# Patient Record
Sex: Female | Born: 1950
Health system: Southern US, Community
[De-identification: ages and names within clinical notes are randomized; demographics above are authoritative.]

## PROBLEM LIST (undated history)

## (undated) DIAGNOSIS — R569 Unspecified convulsions: Secondary | ICD-10-CM

## (undated) DIAGNOSIS — M810 Age-related osteoporosis without current pathological fracture: Secondary | ICD-10-CM

## (undated) DIAGNOSIS — R011 Cardiac murmur, unspecified: Secondary | ICD-10-CM

## (undated) DIAGNOSIS — I119 Hypertensive heart disease without heart failure: Secondary | ICD-10-CM

## (undated) DIAGNOSIS — E039 Hypothyroidism, unspecified: Secondary | ICD-10-CM

## (undated) DIAGNOSIS — I10 Essential (primary) hypertension: Secondary | ICD-10-CM

## (undated) DIAGNOSIS — K219 Gastro-esophageal reflux disease without esophagitis: Secondary | ICD-10-CM

## (undated) DIAGNOSIS — M1611 Unilateral primary osteoarthritis, right hip: Secondary | ICD-10-CM

## (undated) HISTORY — PX: BACK SURGERY: SHX140

## (undated) HISTORY — PX: APPENDECTOMY: SHX54

## (undated) HISTORY — DX: Essential (primary) hypertension: I10

## (undated) HISTORY — DX: Age-related osteoporosis without current pathological fracture: M81.0

## (undated) HISTORY — PX: WRIST FRACTURE SURGERY: SHX121

## (undated) HISTORY — PX: JOINT REPLACEMENT: SHX530

## (undated) HISTORY — PX: FRACTURE SURGERY: SHX138

## (undated) HISTORY — PX: COLONOSCOPY: SHX174

---

## 1991-09-05 HISTORY — PX: VAGINAL HYSTERECTOMY: SUR661

## 1993-09-04 HISTORY — PX: LAPAROSCOPIC CHOLECYSTECTOMY: SUR755

## 1999-08-04 ENCOUNTER — Encounter: Admission: RE | Admit: 1999-08-04 | Discharge: 1999-08-04 | Payer: Self-pay | Admitting: *Deleted

## 1999-08-04 ENCOUNTER — Encounter: Payer: Self-pay | Admitting: *Deleted

## 1999-08-08 ENCOUNTER — Other Ambulatory Visit: Admission: RE | Admit: 1999-08-08 | Discharge: 1999-08-08 | Payer: Self-pay | Admitting: *Deleted

## 2000-01-16 ENCOUNTER — Encounter: Admission: RE | Admit: 2000-01-16 | Discharge: 2000-01-16 | Payer: Self-pay

## 2000-08-07 ENCOUNTER — Encounter: Payer: Self-pay | Admitting: *Deleted

## 2000-08-07 ENCOUNTER — Encounter: Admission: RE | Admit: 2000-08-07 | Discharge: 2000-08-07 | Payer: Self-pay | Admitting: *Deleted

## 2000-10-01 ENCOUNTER — Other Ambulatory Visit: Admission: RE | Admit: 2000-10-01 | Discharge: 2000-10-01 | Payer: Self-pay | Admitting: *Deleted

## 2001-08-12 ENCOUNTER — Encounter: Payer: Self-pay | Admitting: Cardiology

## 2001-08-12 ENCOUNTER — Encounter: Admission: RE | Admit: 2001-08-12 | Discharge: 2001-08-12 | Payer: Self-pay | Admitting: Cardiology

## 2001-11-28 ENCOUNTER — Encounter: Admission: RE | Admit: 2001-11-28 | Discharge: 2001-11-28 | Payer: Self-pay | Admitting: Cardiology

## 2001-11-28 ENCOUNTER — Encounter: Payer: Self-pay | Admitting: Cardiology

## 2002-09-08 ENCOUNTER — Encounter: Payer: Self-pay | Admitting: Cardiology

## 2002-09-08 ENCOUNTER — Ambulatory Visit (HOSPITAL_COMMUNITY): Admission: RE | Admit: 2002-09-08 | Discharge: 2002-09-08 | Payer: Self-pay | Admitting: Cardiology

## 2003-01-01 ENCOUNTER — Encounter: Payer: Self-pay | Admitting: Cardiology

## 2003-01-01 ENCOUNTER — Ambulatory Visit (HOSPITAL_COMMUNITY): Admission: RE | Admit: 2003-01-01 | Discharge: 2003-01-01 | Payer: Self-pay | Admitting: Cardiology

## 2003-09-30 ENCOUNTER — Other Ambulatory Visit: Admission: RE | Admit: 2003-09-30 | Discharge: 2003-09-30 | Payer: Self-pay | Admitting: Obstetrics and Gynecology

## 2004-02-17 ENCOUNTER — Encounter: Admission: RE | Admit: 2004-02-17 | Discharge: 2004-02-17 | Payer: Self-pay | Admitting: Neurosurgery

## 2004-02-23 ENCOUNTER — Ambulatory Visit (HOSPITAL_COMMUNITY): Admission: RE | Admit: 2004-02-23 | Discharge: 2004-02-24 | Payer: Self-pay | Admitting: Neurosurgery

## 2004-02-23 HISTORY — PX: OTHER SURGICAL HISTORY: SHX169

## 2004-05-02 ENCOUNTER — Ambulatory Visit (HOSPITAL_COMMUNITY): Admission: RE | Admit: 2004-05-02 | Discharge: 2004-05-02 | Payer: Self-pay | Admitting: Neurosurgery

## 2004-05-03 ENCOUNTER — Ambulatory Visit (HOSPITAL_COMMUNITY): Admission: RE | Admit: 2004-05-03 | Discharge: 2004-05-04 | Payer: Self-pay | Admitting: Neurosurgery

## 2004-05-03 HISTORY — PX: OTHER SURGICAL HISTORY: SHX169

## 2004-10-10 ENCOUNTER — Encounter: Admission: RE | Admit: 2004-10-10 | Discharge: 2004-10-10 | Payer: Self-pay | Admitting: Cardiology

## 2005-01-03 ENCOUNTER — Emergency Department (HOSPITAL_COMMUNITY): Admission: EM | Admit: 2005-01-03 | Discharge: 2005-01-03 | Payer: Self-pay | Admitting: Emergency Medicine

## 2005-05-16 ENCOUNTER — Inpatient Hospital Stay (HOSPITAL_COMMUNITY): Admission: RE | Admit: 2005-05-16 | Discharge: 2005-05-18 | Payer: Self-pay | Admitting: Neurosurgery

## 2005-05-16 HISTORY — PX: POSTERIOR LUMBAR FUSION: SHX6036

## 2005-11-27 ENCOUNTER — Other Ambulatory Visit: Admission: RE | Admit: 2005-11-27 | Discharge: 2005-11-27 | Payer: Self-pay | Admitting: Obstetrics and Gynecology

## 2006-02-02 ENCOUNTER — Ambulatory Visit: Payer: Self-pay | Admitting: Internal Medicine

## 2006-02-22 ENCOUNTER — Ambulatory Visit: Payer: Self-pay | Admitting: Internal Medicine

## 2006-12-03 ENCOUNTER — Ambulatory Visit (HOSPITAL_COMMUNITY): Admission: RE | Admit: 2006-12-03 | Discharge: 2006-12-03 | Payer: Self-pay | Admitting: Obstetrics and Gynecology

## 2007-12-04 ENCOUNTER — Ambulatory Visit (HOSPITAL_COMMUNITY): Admission: RE | Admit: 2007-12-04 | Discharge: 2007-12-04 | Payer: Self-pay | Admitting: Obstetrics and Gynecology

## 2008-12-10 ENCOUNTER — Ambulatory Visit (HOSPITAL_COMMUNITY): Admission: RE | Admit: 2008-12-10 | Discharge: 2008-12-10 | Payer: Self-pay | Admitting: Obstetrics and Gynecology

## 2009-01-14 ENCOUNTER — Emergency Department (HOSPITAL_COMMUNITY): Admission: EM | Admit: 2009-01-14 | Discharge: 2009-01-14 | Payer: Self-pay | Admitting: Emergency Medicine

## 2009-01-14 ENCOUNTER — Ambulatory Visit (HOSPITAL_BASED_OUTPATIENT_CLINIC_OR_DEPARTMENT_OTHER): Admission: RE | Admit: 2009-01-14 | Discharge: 2009-01-14 | Payer: Self-pay | Admitting: Orthopedic Surgery

## 2009-01-14 HISTORY — PX: OTHER SURGICAL HISTORY: SHX169

## 2009-12-16 ENCOUNTER — Ambulatory Visit (HOSPITAL_COMMUNITY): Admission: RE | Admit: 2009-12-16 | Discharge: 2009-12-16 | Payer: Self-pay | Admitting: Obstetrics and Gynecology

## 2010-02-18 ENCOUNTER — Encounter: Admission: RE | Admit: 2010-02-18 | Discharge: 2010-02-18 | Payer: Self-pay | Admitting: Cardiology

## 2010-05-05 ENCOUNTER — Ambulatory Visit: Payer: Self-pay | Admitting: Cardiology

## 2010-08-18 ENCOUNTER — Ambulatory Visit: Payer: Self-pay | Admitting: Cardiology

## 2010-10-03 ENCOUNTER — Ambulatory Visit: Payer: Self-pay | Admitting: Cardiology

## 2010-10-11 ENCOUNTER — Emergency Department (HOSPITAL_COMMUNITY)
Admission: EM | Admit: 2010-10-11 | Discharge: 2010-10-11 | Disposition: A | Discharge status: Home / Self Care | Payer: 59 | Attending: Emergency Medicine | Admitting: Emergency Medicine

## 2010-10-11 DIAGNOSIS — R040 Epistaxis: Secondary | ICD-10-CM | POA: Insufficient documentation

## 2010-10-11 DIAGNOSIS — Z79899 Other long term (current) drug therapy: Secondary | ICD-10-CM | POA: Insufficient documentation

## 2010-10-18 ENCOUNTER — Ambulatory Visit (INDEPENDENT_AMBULATORY_CARE_PROVIDER_SITE_OTHER): Payer: Commercial Managed Care - PPO | Admitting: Cardiology

## 2010-10-18 DIAGNOSIS — E7800 Pure hypercholesterolemia, unspecified: Secondary | ICD-10-CM

## 2010-10-18 DIAGNOSIS — Z79899 Other long term (current) drug therapy: Secondary | ICD-10-CM

## 2010-10-18 DIAGNOSIS — I119 Hypertensive heart disease without heart failure: Secondary | ICD-10-CM

## 2010-11-01 ENCOUNTER — Ambulatory Visit (INDEPENDENT_AMBULATORY_CARE_PROVIDER_SITE_OTHER): Payer: Commercial Managed Care - PPO | Admitting: Cardiology

## 2010-11-01 DIAGNOSIS — I119 Hypertensive heart disease without heart failure: Secondary | ICD-10-CM

## 2010-11-22 ENCOUNTER — Other Ambulatory Visit: Payer: Self-pay | Admitting: *Deleted

## 2010-11-22 DIAGNOSIS — G4700 Insomnia, unspecified: Secondary | ICD-10-CM

## 2010-11-22 MED ORDER — CLONAZEPAM 1 MG PO TABS: 1.0000 mg | ORAL_TABLET | ORAL | Status: DC

## 2010-11-22 NOTE — Telephone Encounter (Signed)
Refilled meds per fax request. CALLED TO PHARMACY.

## 2010-11-28 ENCOUNTER — Inpatient Hospital Stay (INDEPENDENT_AMBULATORY_CARE_PROVIDER_SITE_OTHER)
Admission: RE | Admit: 2010-11-28 | Discharge: 2010-11-28 | Disposition: A | Discharge status: Home / Self Care | Payer: Commercial Managed Care - PPO | Source: Ambulatory Visit | Attending: Family Medicine | Admitting: Family Medicine

## 2010-11-28 DIAGNOSIS — J069 Acute upper respiratory infection, unspecified: Secondary | ICD-10-CM

## 2010-11-29 ENCOUNTER — Other Ambulatory Visit: Payer: Self-pay | Admitting: Cardiology

## 2010-11-29 DIAGNOSIS — Z1231 Encounter for screening mammogram for malignant neoplasm of breast: Secondary | ICD-10-CM

## 2010-12-28 ENCOUNTER — Ambulatory Visit (HOSPITAL_COMMUNITY): Payer: 59

## 2010-12-28 ENCOUNTER — Ambulatory Visit (HOSPITAL_COMMUNITY)
Admission: RE | Admit: 2010-12-28 | Discharge: 2010-12-28 | Disposition: A | Discharge status: Home / Self Care | Payer: 59 | Source: Ambulatory Visit | Attending: Cardiology | Admitting: Cardiology

## 2010-12-28 DIAGNOSIS — Z1231 Encounter for screening mammogram for malignant neoplasm of breast: Secondary | ICD-10-CM | POA: Insufficient documentation

## 2011-01-02 ENCOUNTER — Other Ambulatory Visit: Payer: Self-pay | Admitting: Cardiology

## 2011-01-03 ENCOUNTER — Other Ambulatory Visit: Payer: Self-pay | Admitting: *Deleted

## 2011-01-03 DIAGNOSIS — I10 Essential (primary) hypertension: Secondary | ICD-10-CM

## 2011-01-03 MED ORDER — HYDROCHLOROTHIAZIDE 12.5 MG PO TABS: 12.5000 mg | ORAL_TABLET | Freq: Every day | ORAL | Status: DC

## 2011-01-03 NOTE — Telephone Encounter (Signed)
Fax received from pharmacy. Refill completed. Jodette Suzannah Bettes RN  

## 2011-01-17 NOTE — Op Note (Signed)
Brittany Washington, [name]             ACCOUNT NO.:  [id number]   MEDICAL RECORD NO.:  [id number]          PATIENT TYPE:  AMB   LOCATION:  DSC                          FACILITY:  [facility]   PHYSICIAN:  Katy Fitch. Sypher, M.D. DATE OF BIRTH:  07/10/1951   DATE OF PROCEDURE:  01/14/2009  DATE OF DISCHARGE:                               OPERATIVE REPORT   PREOPERATIVE DIAGNOSES:  Severely comminuted intra-articular impacted  and displaced fracture of right distal radius with associated ulnar  styloid avulsion fracture.   POSTOPERATIVE DIAGNOSES:  Profoundly comminuted osteoporotic fracture of  distal radius with fracture between the scaphoid and lunate facets and  severe comminution of radial styloid and volar cortex.  This fracture  was more than 15 pieces.   OPERATION:  Open reduction, cancellous allograft augmentation, and  internal fixation of left distal radius severely comminuted articular  fracture utilizing a DVR 7-peg plate system.   OPERATING SURGEON:  Katy Fitch. Sypher, MD   ASSISTANT:  Marveen Reeks Dasnoit, PA   ANESTHESIA:  General by endotracheal tube supplemented by a right  infraclavicular block.   SUPERVISING ANESTHESIOLOGIST:  Quita Skye. 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  Earlier  this morning, she experienced a fall at the [facility] landing hard onto her outstretched left hand.  She  states that she heard it break and immediately noted a deformity.  She  was evaluated at the [facility] Emergency Room, where x-rays revealed a  severely comminuted articular fracture of the distal radius with marked  comminution of the radial styloid and volar cortex and extension into  the radiocarpal and distal radioulnar joint articulations.   She was splinted at the [facility] Emergency Room and requested an  opportunity to Orthopedic Consult  with myself.   We were operating at the [facility] and requested that she  and her husband, Dr. Alvis Lemmings, a retired neurosurgeon transport  her to the [facility] for acute evaluation.  She had eaten  early in the morning and we had an opportunity to provide urgent  surgical care of her fracture.   Brittany Washington was brought to the holding area of the [facility] where she was noted to be awake and alert.  She had a sandwich  splint applied to her left forearm and noted some discomfort in her left  shoulder and elbow region, but was able to demonstrate full motion.  Her  median, ulnar, and radial sensibility was intact.   A history and physical was accomplished.  She was noted to be allergic  to SULFA.  She had had a respiratory arrest with PARENTERAL DILAUDID;  however, that was thought to be dose related.   After detailed informed consent and review of her films, we advised open  reduction and internal fixation of her fracture utilizing a DVR plate  system to obtain and maintain as anatomic as possible reduction.   This fracture is  very technically demanding with profound comminution of  the radial styloid and the entire volar cortex.  There at least 15  fracture fragments.  We advised use of cancellus allograft to augment  the integrity of the radial styloid and subchondral bone with  application of both threaded and smooth pegs for fixation of the  fracture fragments.  Questions were invited and answered in detail in  the holding area.   Brittany Washington was interviewed by Dr. Krista Blue from the Anesthesia Service  and requested a block.  Dr. Krista Blue recommended general anesthesia and a  block for perioperative pain control.  After informed consent, Dr.  Krista Blue placed the block in the holding area without complication.  [name] was transferred to room 6 of the [facility], placed  in supine position on the operating table and  under Dr. Robina Ade direct  supervision, general endotracheal anesthesia induced due to her history  of having breakfast.   The left arm was carefully positioned on an arm table and PAS passive  compression hose applied for DVT prophylaxis.  A pneumatic tourniquet  was applied to the proximal left brachium.   Ancef 1 g was administered as an IV prophylactic antibiotic followed by  routine Betadine scrub and paint, followed by sterile draping with  stockinette and impervious arthroscopy drapes.  The left upper extremity  was exsanguinated with an Esmarch bandage and the arterial tourniquet  inflated to 250 mmHg due to mild systolic hypertension.  The procedure  commenced with standard DVR curvilinear incision.  The interval  overlying the flexor carpi radialis was dissected sharply with  retraction of the radial artery and the radial superficial sensory  branches in radial direction and the flexor carpi ulnaris and the flexor  pollicis longus in an ulnar direction.  The fracture site was palpated.  There was marked deformity with multiple severely displaced, comminuted  volar fracture fragments.  The pronator quadratus was carefully elevated  followed by meticulous jigsaw puzzle piecing back together of the volar  cortex.   A 7-peg DVR plate was selected as an appropriate device to maintain  reduction.  The plate was applied and adjusted with 62 K-wires followed  by sequential placement of 5 smooth pegs and two threaded pegs.  The  threaded pegs were used to secure the ulnar fragment at the lunate facet  and the right radial styloid.  Due to the profound comminution of the  styloid and the volar cortex, approximately 3 mL of cancellus allograft  were packed into the styloid region for augmenting the subchondral bone  integrity.   Multiple C-arm images were obtained during placement of the plate, pegs  and, screws and while packing bone graft to assure that the articular  surface  remain congress.   We were able to recover the length of the radius to ulnar minus 1 mm,  and we are able to restore radial slope and tilt.  There were no  apparent complications.   The periosteum and pronator quadratus was meticulously repaired with  multiple figure-of-eight sutures of 0 Vicryl followed by repair of the  skin with subcutaneous suture of 4-0 Vicryl and intradermal 3-0 Prolene.   Brittany Washington will be placed in a sugar-tong splint with her forearm in  neutral pronation, supination.   We will see her back for followup in the office in 4 days for a dressing  change and initiation of the therapy program.   She is advised to elevate her hand above  her heart and to work on range  of motion exercises of her fingers once her block wears off.   We will contact her by phone in 24 hours to be certain she is  progressing well.      Katy Fitch Sypher, M.D.  Electronically Signed     RVS/MEDQ  D:  01/14/2009  T:  01/15/2009  Job:  [id number]

## 2011-01-18 ENCOUNTER — Other Ambulatory Visit: Payer: Self-pay | Admitting: *Deleted

## 2011-01-18 DIAGNOSIS — E039 Hypothyroidism, unspecified: Secondary | ICD-10-CM

## 2011-01-18 DIAGNOSIS — E7800 Pure hypercholesterolemia, unspecified: Secondary | ICD-10-CM

## 2011-01-18 DIAGNOSIS — Z79899 Other long term (current) drug therapy: Secondary | ICD-10-CM

## 2011-01-20 ENCOUNTER — Other Ambulatory Visit (INDEPENDENT_AMBULATORY_CARE_PROVIDER_SITE_OTHER): Payer: 59 | Admitting: *Deleted

## 2011-01-20 DIAGNOSIS — E7800 Pure hypercholesterolemia, unspecified: Secondary | ICD-10-CM

## 2011-01-20 DIAGNOSIS — Z79899 Other long term (current) drug therapy: Secondary | ICD-10-CM

## 2011-01-20 DIAGNOSIS — E039 Hypothyroidism, unspecified: Secondary | ICD-10-CM

## 2011-01-20 LAB — CBC WITH DIFFERENTIAL/PLATELET
Basophils Absolute: 0 10*3/uL (ref 0.0–0.1)
Basophils Relative: 0.6 % (ref 0.0–3.0)
HCT: 40 % (ref 36.0–46.0)
Hemoglobin: 13.8 g/dL (ref 12.0–15.0)
Lymphocytes Relative: 34.1 % (ref 12.0–46.0)
Lymphs Abs: 2 10*3/uL (ref 0.7–4.0)
MCV: 95.5 fl (ref 78.0–100.0)
Neutrophils Relative %: 55 % (ref 43.0–77.0)
Platelets: 194 10*3/uL (ref 150.0–400.0)
WBC: 5.9 10*3/uL (ref 4.5–10.5)

## 2011-01-20 LAB — HEPATIC FUNCTION PANEL
ALT: 56 U/L — ABNORMAL HIGH (ref 0–35)
AST: 43 U/L — ABNORMAL HIGH (ref 0–37)
Bilirubin, Direct: 0.1 mg/dL (ref 0.0–0.3)
Total Protein: 7.1 g/dL (ref 6.0–8.3)

## 2011-01-20 LAB — LIPID PANEL
LDL Cholesterol: 65 mg/dL (ref 0–99)
Total CHOL/HDL Ratio: 2
Triglycerides: 125 mg/dL (ref 0.0–149.0)
VLDL: 25 mg/dL (ref 0.0–40.0)

## 2011-01-20 LAB — BASIC METABOLIC PANEL
BUN: 18 mg/dL (ref 6–23)
CO2: 26 mEq/L (ref 19–32)
Creatinine, Ser: 0.9 mg/dL (ref 0.4–1.2)
GFR: 70.05 mL/min (ref >60.00)

## 2011-01-20 LAB — T4, FREE: Free T4: 0.79 ng/dL (ref 0.60–1.60)

## 2011-01-20 LAB — TSH: TSH: 0.63 u[IU]/mL (ref 0.35–5.50)

## 2011-01-20 NOTE — Op Note (Signed)
NAMEREIDA, Brittany Washington             ACCOUNT NO.:  [id number]   MEDICAL RECORD NO.:  [id number]          PATIENT TYPE:  INP   LOCATION:  2866                         FACILITY:  [facility]   PHYSICIAN:  Hilda Lias, M.D.   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   POSTOPERATIVE DIAGNOSIS:  Recurrent herniated disk at the level 4-5 left.   OPERATION PERFORMED:  Left L4-5 diskectomy.  Interbody fusion with a banana  cage.  BMP autograft allograft.  Posterolateral arthrodesis.  Spire clamp.  Microscope.   SURGEON:  Hilda Lias, M.D.   ASSISTANT:  Stefani Dama, M.D.   ANESTHESIA:  General.   INDICATIONS FOR PROCEDURE:  [name] had twice surgery by Dr. Venetia Maxon at  the level 4-5.  The patient fell back in May of this year and after that she  developed again, pain in her back with radiation to the left leg.  MRI  showed that she has a recurrent herniated disk at the level of 4-5.  This  was going to be a third procedure.  We agreed with total diskectomy and  fusion using a cage as well as posterolateral arthrodesis, plus a Spire  clamp. The risks were explained in history and physical.   DESCRIPTION OF PROCEDURE:  The patient was taken to the operating room and  she was positioned in prone manner.  The previous scar was resected.  Then a  midline incision between 4-5 was made, muscle was retracted bilaterally.  On  the left side we went laterally to the lateral aspect of the facet.  We  brought the microscope into the area and with the Kerrison punch we removed  more of the lamina of L4 and L5.  This bone was saved for posterior use.  We  found quite a bit of adhesion.  Lysis was accomplished.  Indeed right at the  take off of the L5 nerve root was a large fragment compromising the L5 nerve  root.  Removal was  accomplished.  Lysis was done and we had plenty of space  and plenty of room for the L4 and L5 nerve root.  Then we proceeded with a  basic diskectomy removing disk from the left side all the way down to the  right side.  The end plates were removed.  Then a banana shaped cage with  BMP and autograft of 10 x 30 was inserted.  Right after that a mix of BMP  with autograft was used to fill out the space anterior of the disk anterior.  After the cage was inserted, we used laterally the same substance to fill  out the space.  From then on, we went to the right side.  We drilled the  lamina of 4-5 as well as the facets.  A mix of autograft and BMP plus bone  expander were used for arthrodesis.  The rest of the BMP and  autograft was used laterally at  the level 4-5.  Then the [facility] clamp was  inserted and secured in place.  Lateral lumbar spine showed good position of  the clamp as well as the graft.  Valsalva maneuver was negative.  The area  was irrigated.  Fentanyl was left in the epidural space and the wound was  closed with Vicryl and Steri-Strips.           ______________________________  Hilda Lias, M.D.     EB/MEDQ  D:  05/16/2005  T:  05/16/2005  Job:  [id number]

## 2011-01-20 NOTE — H&P (Signed)
Brittany Washington, NIENOW NO.:  [id number]   MEDICAL RECORD NO.:  [id number]          PATIENT TYPE:  INP   LOCATION:  3014                         FACILITY:  [facility]   PHYSICIAN:  Hilda Lias, M.D.   DATE OF BIRTH:  [birth year]   DATE OF ADMISSION:  05/16/2005  DATE OF DISCHARGE:                                HISTORY & PHYSICAL   Ms. Brittany Washington is a wife to Dr. Hope Pigeon who had been complaining of back  pain with radiation to the left leg.  In the past she underwent twice  surgery by Dr. Venetia Maxon at the level 4-5.  Nevertheless, she came back to see  me because of back pain with radiation down to the left leg going to the  left knee.  In May of this year, she had a fracture to the left foot and  since then she noticed that the pain was getting worse.  We have the repeat  MRI that showed that indeed she has had recurrent disk at the level 4-5  which is going to be the third one.  Because of that we decided to admit her  to the hospital for surgery which includes fusion.   PAST MEDICAL HISTORY:  1.  Twice L4-L5 diskectomy.  2.  Vaginal hysterectomy.  3.  Cholecystectomy.   She is allergic to SULFA.   SOCIAL HISTORY:  She does not smoke.  She drinks socially.   FAMILY HISTORY:  Father has carcinoma of the colon.   PHYSICAL EXAMINATION:  GENERAL:  Patient came to my office limping from the  left leg.  HEENT:  Normal.  NECK:  Normal.  LUNGS:  Clear.  HEART:  Sounds normal.  ABDOMEN:  Normal.  EXTREMITIES:  Normal pulse.  NEUROLOGIC:  Mental status normal.  Cranial nerves normal.  Extremities  showed weakness on dorsiflexion of the left foot with straight leg raising  (SLR), positive 45 degrees on the left side.  She has numbness which  involves the L4 and L5 nerve root.   CLINICAL IMPRESSION:  Recurrent left L4-L5 herniated disk.   RECOMMENDATIONS:  The patient is being admitted for surgery.  We are going  to proceed with a total 4-5 diskectomy to the left  side, either  extraforaminal or removing the facet and fusion using a banana-shaped type  of a cage with autograft and BMP.  Also, we are going to do a right 4-5  posterolateral arthrodesis.  The surgery was fully  explained to them.  I gave the literature to both of them.  They know about  the risks such as no improvement whatsoever, failure of the bone graft, need  for further surgery, CSF leak, infection.  Also, in the above procedure, we  are going to put a Spire clamp between the spinous process of L4-5.           ______________________________  Hilda Lias, M.D.     EB/MEDQ  D:  05/16/2005  T:  05/16/2005  Job:  [id number]

## 2011-01-20 NOTE — Op Note (Signed)
NAME:  Brittany Washington, Brittany Washington                       ACCOUNT NO.:  [id number]   MEDICAL RECORD NO.:  [id number]                   PATIENT TYPE:  OIB   LOCATION:  3014                                 FACILITY:  [facility]   PHYSICIAN:  Danae Orleans. Venetia Maxon, M.D.               DATE OF BIRTH:  12/23/1950   DATE OF PROCEDURE:  DATE OF DISCHARGE:                                 OPERATIVE REPORT   PREOPERATIVE DIAGNOSIS:  Recurrent herniated lumbar disk, L4-5, left with  left L5 radiculopathy.   POSTOPERATIVE DIAGNOSIS:  Recurrent herniated lumbar disk, L4-5, left with  left L5 radiculopathy.   OPERATION/PROCEDURE:  Redo microdiskectomy, L4-5, left with microdissection.   SURGEON:  Danae Orleans. Venetia Maxon, M.D.   ASSISTANT:  Hilda Lias, M.D.   ANESTHESIA:  General endotracheal anesthesia.   ESTIMATED BLOOD LOSS:  Minimal.   COMPLICATIONS:  None.   DISPOSITION:  Recovery.   INDICATIONS:  [name] is a 60 year old woman with left L5  radiculopathy.  She had prior left L4-5 microdiskectomy and then presented  11 weeks postoperatively decreased left leg pain and MRI was obtained which  demonstrated what appeared to be a recurrent fragment for __________L5 nerve  root.  It was elected to take her to surgery for microdiskectomy.   DESCRIPTION OF PROCEDURE:  [name] was brought to the operating room.  Following satisfactory and uncomplicated general endotracheal anesthesia and  placement of IV lines, the patient was placed in the prone position on the  operating table on the Wilson frame with a low back and then prepped and  draped in the usual sterile fashion.  The area __________ incision was  infiltrated with 0.25% Marcaine and 0.5% lidocaine and 1:200,000  epinephrine.  Incision was made and carried through her previous incision  and carried through the lumbodorsal fascia on the left side of midline and  using subperiosteal dissection.  The previous laminotomy defect was  identified.   There did not appear to be significantly adherent scar tissue  at this level.  Intraoperative x-ray confirmed direct orientation.  Lateral  recess was then decompressed and microscope was brought into the field.  Using microdissection technique, the L5 nerve root was mobilized medially.  There was a fragment of disk material within the lateral recess which was  along with some adherent scar tissue.  This was then carefully freed from  overlying nerve root and the recurrent disk fragment was then removed using  a variety of pituitary rongeurs.  The Epstein curets were used to evacuate  residual disk material from the interspace.  A coronary dilator was easily  inserted along the floor of the canal that did not appear to be residual  disk material compressing the nerve root.  The osteophyte tool was used to  remove spondylitic lip of bone and ligament along the superior aspect of L5  and inferior aspect of L4.  The wound was copiously irrigated with  bacitracin saline and the wound was then bathed in 2 mL of fentanyl, 80 mg  of Depo-Medrol.  The lumbodorsal fascia was then closed with 0 Vicryl  sutures.  Subcutaneous tissues were reapproximated with 2-0 Vicryl  interrupted inverted sutures and skin edges were reapproximated with  interrupted 3-  0 Vicryl subcuticular sutures.  The wound was dressed with Dermabond.  The  patient was extubated in the operating room and taken to the recovery room  in stable and satisfactory condition.  She tolerated the operation well.  Counts correct at the end of the case.                                               Danae Orleans. Venetia Maxon, M.D.    JDS/MEDQ  D:  05/03/2004  T:  05/03/2004  Job:  [id number]

## 2011-01-20 NOTE — Op Note (Signed)
NAME:  Brittany Washington, Brittany Washington                       ACCOUNT NO.:  [id number]   MEDICAL RECORD NO.:  [id number]                   PATIENT TYPE:  OIB   LOCATION:  2899                                 FACILITY:  [facility]   PHYSICIAN:  Danae Orleans. Venetia Maxon, M.D.               DATE OF BIRTH:  1951/08/20   DATE OF PROCEDURE:  02/23/2004  DATE OF DISCHARGE:                                 OPERATIVE REPORT   PREOPERATIVE DIAGNOSIS:  Left L4-L5 herniated lumbar disc with spondylosis,  degenerative disc disease, and radiculopathy.   POSTOPERATIVE DIAGNOSIS:  Left L4-L5 herniated lumbar disc with spondylosis,  degenerative disc disease, and radiculopathy.   PROCEDURE:  Left L4-L5 microdiscectomy with microdissection.   SURGEON:  Danae Orleans. Venetia Maxon, M.D.   ASSISTANT:  Hewitt Shorts, M.D.   ANESTHESIA:  General endotracheal anesthesia.   ESTIMATED BLOOD LOSS:  Minimal.   COMPLICATIONS:  None.   DISPOSITION:  Recovery room.   INDICATIONS FOR PROCEDURE:  Brittany Washington is a 60 year old woman with left  leg pain in the L5 distribution, weakness, and significant discomfort.  She  has a herniated disc at L4-L5 on the left.  It was elected to take her to  surgery for microdiscectomy at the L4-L5 level.   PROCEDURE:  Ms. Cegielski is brought to the operating room.  Following  satisfactory uncomplicated induction of general endotracheal anesthesia and  placement of intravenous lines, the patient was placed in a prone position  on the operating table.  Her low back was then prepped and draped in the  usual sterile fashion.  An incision was made after localizing x-ray  confirmed correct orientation of the L4-L5 level and an incision was made  overlying the L4-L5 interspace, carried sharply through the lumbodorsal  fascia on the left side of the midline.  Subperiosteal dissection was then  performed exposing  the L4-L5 interspace.  An interoperative x-ray confirmed  the correct level.  Subsequently,  hemi-semilaminectomy was performed with  the high speed drill and completed with Kerrison rongeurs.  A foraminotomy  was performed overlying the L5 nerve root.  The ligamentum flavum was  detached and removed in a piecemeal fashion resulting in significant  decompression of the thecal sac and the lateral recess was also  decompressed.  The microscope was brought onto the field and under high  magnification and microscopic visualization, the L5 nerve root was found to  be swollen and significantly displaced and there was a free fragment of  herniated disc material beneath the L5 nerve root which was directly  compressing the nerve root.  This was decompressed and multiple fragments of  disc material were removed.  The annulus was then inspected and was found to  be bulging significantly and because of this, it was elected to incise the  interspace and remove additional disc material.  The nerve root retractor  was used to protect the thecal sac and L5 nerve root  and the interspace was  further evacuated of residual disc material, both medially and laterally and  the endplates were stripped of residual disc material.  Redundant annular  edges were removed with osteophyte tool and hemostasis was assured with  bipolar electrocautery and subsequently with Gelfoam.  It was felt that the  nerve root was well decompressed as was the common dural tube.  Hemostasis  was assured.  The self-retaining retractor was removed.  The lumbodorsal  fascia was closed with 0 Vicryl sutures, the subcutaneous tissues were  reapproximated with 2-0 Vicryl interrupted inverted sutures, and the skin  edges were reapproximated with interrupted 3-0 Vicryl subcuticular tissue.  The wound was dressed with Dermabond.  The patient was extubated in the  operating room and taken to the recovery room in stable condition having  tolerated the operation well.  Counts were correct at the end of the case.                                                Danae Orleans. Venetia Maxon, M.D.    JDS/MEDQ  D:  02/23/2004  T:  02/24/2004  Job:  [id number]

## 2011-01-23 ENCOUNTER — Other Ambulatory Visit: Payer: Self-pay | Admitting: *Deleted

## 2011-01-23 ENCOUNTER — Telehealth: Payer: Self-pay | Admitting: *Deleted

## 2011-01-23 DIAGNOSIS — R899 Unspecified abnormal finding in specimens from other organs, systems and tissues: Secondary | ICD-10-CM

## 2011-01-23 NOTE — Telephone Encounter (Signed)
Advised patient of labs and scheduled labs

## 2011-01-23 NOTE — Telephone Encounter (Signed)
Message copied by Regis Bill on Mon Jan 23, 2011  3:11 PM ------      Message from: Cassell Clement      Created: Sun Jan 22, 2011  7:39 AM       Please report. Lipids good.Thyroid level normal now on Rx.      Two of LFTs are elevated. Would avoid alcohol if she drinks and we should recheck Liver panel 1week before her July appt.

## 2011-01-27 ENCOUNTER — Ambulatory Visit: Payer: 59 | Admitting: Cardiology

## 2011-02-17 ENCOUNTER — Other Ambulatory Visit: Payer: 59 | Admitting: *Deleted

## 2011-02-17 ENCOUNTER — Ambulatory Visit: Payer: 59 | Admitting: Cardiology

## 2011-02-20 ENCOUNTER — Other Ambulatory Visit: Payer: Self-pay | Admitting: Cardiology

## 2011-02-20 DIAGNOSIS — E039 Hypothyroidism, unspecified: Secondary | ICD-10-CM

## 2011-02-20 NOTE — Telephone Encounter (Signed)
escribe request  

## 2011-03-06 ENCOUNTER — Other Ambulatory Visit: Payer: 59 | Admitting: *Deleted

## 2011-03-13 ENCOUNTER — Ambulatory Visit: Payer: 59 | Admitting: Cardiology

## 2011-03-21 ENCOUNTER — Encounter: Payer: Self-pay | Admitting: Internal Medicine

## 2011-03-29 ENCOUNTER — Other Ambulatory Visit: Payer: 59 | Admitting: *Deleted

## 2011-03-31 ENCOUNTER — Other Ambulatory Visit: Payer: Self-pay | Admitting: Cardiology

## 2011-03-31 DIAGNOSIS — E785 Hyperlipidemia, unspecified: Secondary | ICD-10-CM

## 2011-04-05 ENCOUNTER — Ambulatory Visit: Payer: 59 | Admitting: Cardiology

## 2011-05-31 ENCOUNTER — Other Ambulatory Visit: Payer: Self-pay | Admitting: *Deleted

## 2011-05-31 DIAGNOSIS — G4700 Insomnia, unspecified: Secondary | ICD-10-CM

## 2011-05-31 MED ORDER — CLONAZEPAM 1 MG PO TABS: ORAL_TABLET | ORAL | Status: DC

## 2011-05-31 NOTE — Telephone Encounter (Signed)
Refilled meds per fax request. Faxed signed Rx back 

## 2011-06-05 ENCOUNTER — Other Ambulatory Visit (INDEPENDENT_AMBULATORY_CARE_PROVIDER_SITE_OTHER): Payer: 59 | Admitting: *Deleted

## 2011-06-05 DIAGNOSIS — R899 Unspecified abnormal finding in specimens from other organs, systems and tissues: Secondary | ICD-10-CM

## 2011-06-05 DIAGNOSIS — R6889 Other general symptoms and signs: Secondary | ICD-10-CM

## 2011-06-05 LAB — HEPATIC FUNCTION PANEL
ALT: 72 U/L — ABNORMAL HIGH (ref 0–35)
Alkaline Phosphatase: 89 U/L (ref 39–117)
Bilirubin, Direct: 0.1 mg/dL (ref 0.0–0.3)
Total Protein: 8.1 g/dL (ref 6.0–8.3)

## 2011-06-06 ENCOUNTER — Ambulatory Visit: Payer: 59 | Admitting: Cardiology

## 2011-06-08 ENCOUNTER — Telehealth: Payer: Self-pay | Admitting: *Deleted

## 2011-06-08 NOTE — Telephone Encounter (Signed)
Advised of labs, actually discontinued her Crestor a couple of months ago secondary to aches.  Will discuss further at office visit next week

## 2011-06-08 NOTE — Telephone Encounter (Signed)
Message copied by Burnell Blanks on Thu Jun 08, 2011  4:47 PM ------      Message from: Cassell Clement      Created: Tue Jun 06, 2011  8:30 AM       Please report.  The liver function tests are worse.  I want her to stop the Crestor for the time being.  She should also avoid all alcohol.  Recheck hepatic function panel in 2 months.  Discuss further at office visit.

## 2011-06-08 NOTE — Progress Notes (Signed)
Advised ,will discuss further at [facility]

## 2011-06-13 ENCOUNTER — Ambulatory Visit (INDEPENDENT_AMBULATORY_CARE_PROVIDER_SITE_OTHER): Payer: 59 | Admitting: Cardiology

## 2011-06-13 ENCOUNTER — Encounter: Payer: Self-pay | Admitting: Cardiology

## 2011-06-13 VITALS — BP 126/80 | HR 74 | Ht 64.0 in | Wt 154.0 lb

## 2011-06-13 DIAGNOSIS — R011 Cardiac murmur, unspecified: Secondary | ICD-10-CM

## 2011-06-13 DIAGNOSIS — E039 Hypothyroidism, unspecified: Secondary | ICD-10-CM

## 2011-06-13 DIAGNOSIS — R945 Abnormal results of liver function studies: Secondary | ICD-10-CM

## 2011-06-13 DIAGNOSIS — I119 Hypertensive heart disease without heart failure: Secondary | ICD-10-CM

## 2011-06-13 DIAGNOSIS — R7989 Other specified abnormal findings of blood chemistry: Secondary | ICD-10-CM

## 2011-06-13 NOTE — Assessment & Plan Note (Signed)
The patient is clinically euthyroid on her present dose of Synthroid.  She had recent thyroid function studies, which showed that she was in normal range

## 2011-06-13 NOTE — Patient Instructions (Signed)
Follow up in 3-4 weeks for labs   Your physician wants you to follow-up in: 6 month You will receive a reminder letter in the mail two months in advance. If you don't receive a letter, please call our office to schedule the follow-up appointment.

## 2011-06-13 NOTE — Progress Notes (Signed)
Val Eagle Date of Birth:  April 24, 1951 [facility] [address]   [address] [place], [place]  [address] [phone]           Fax   [phone]  History of Present Illness: This pleasant 60 year old, Caucasian female, seen for six-month followup office visit.  She has been feeling well.  Her recent liver function studies were elevated, however.  She does not have any symptoms of hepatitis.  She has been reading that people of her age have a higher likelihood of having been exposed to hepatitis C and she would like to be tested for it.  She does have unexplained elevation of her liver function studies.  However, she has been taking large quantities of Motrin and Advil, which may explain the elevation of the transaminases.  Drinks a very little wine, less than one glass per day.  Current Outpatient Prescriptions  Medication Sig Dispense Refill  . aspirin 81 MG tablet Take 81 mg by mouth. Pt takes asa at least two times a week       . clonazePAM (KLONOPIN) 1 MG tablet 1 to 2 tabs at bedtime as needed  60 tablet  3  . hydrochlorothiazide (,MICROZIDE/HYDRODIURIL,) 12.5 MG capsule TAKE 1 CAPSULE BY MOUTH ONCE DAILY  100 capsule  PRN  . ibuprofen (MOTRIN IB) 200 MG tablet Take 200 mg by mouth every 6 (six) hours as needed. If needed for wrist pain       . levothyroxine (SYNTHROID, LEVOTHROID) 112 MCG tablet TAKE 1 TABLET BY MOUTH DAILY  30 tablet  11    Allergies  Allergen Reactions  . Sulfa Antibiotics     There is no problem list on file for this patient.   History  Smoking status  . Never Smoker   Smokeless tobacco  . Not on file    History  Alcohol Use: Not on file    No family history on file.  Review of Systems: Constitutional: no fever chills diaphoresis or fatigue or change in weight.  Head and neck: no hearing loss, no epistaxis, no photophobia or visual disturbance. Respiratory: No cough, shortness of breath or  wheezing. Cardiovascular: No chest pain peripheral edema, palpitations. Gastrointestinal: No abdominal distention, no abdominal pain, no change in bowel habits hematochezia or melena. Genitourinary: No dysuria, no frequency, no urgency, no nocturia. Musculoskeletal:No arthralgias, no back pain, no gait disturbance or myalgias. Neurological: No dizziness, no headaches, no numbness, no seizures, no syncope, no weakness, no tremors. Hematologic: No lymphadenopathy, no easy bruising. Psychiatric: No confusion, no hallucinations, no sleep disturbance.    Physical Exam: Filed Vitals:   06/13/11 1348  BP: 126/80  Pulse: 74   Gen. appearance reveals a well-developed, well-nourished woman in no distress.Pupils equal and reactive.   Extraocular Movements are full.  There is no scleral icterus.  The mouth and pharynx are normal.  The neck is supple.  The carotids reveal no bruits.  The jugular venous pressure is normal.  The thyroid is not enlarged.  There is no lymphadenopathy.  The chest is clear to percussion and auscultation. There are no rales or rhonchi. Expansion of the chest is symmetrical.  He does have a pectus excavatum. Heart reveals a soft systolic ejection murmur at the base.  No diastolic murmur, no gallop, or rub.  Normal abdomen with no hepatomegalyThe pedal pulses are good.  There is no phlebitis or edema.  There is no cyanosis or clubbing. Strength is normal and symmetrical in  all extremities.  There is no lateralizing weakness.  There are no sensory deficits.  The skin is warm and dry.  There is no rash.  EKG today shows normal sinus rhythm with nonspecific ST abnormality.    Assessment / Plan:  Return in 3-4 weeks for lab work as noted above to followup on her liver function studies.  We will also be checking electrolytes, since she is on hydrochlorothiazide.  Recheck in 6 months for followup office visit

## 2011-06-13 NOTE — Assessment & Plan Note (Signed)
Patient has a history of mild essential hypertension.  She is on low-dose hydrochlorothiazide.  Been feeling well without any chest pain or shortness of breath.

## 2011-06-13 NOTE — Assessment & Plan Note (Signed)
The patient's liver function studies were higher on her last testing recently.  We will arrange for her to totally stop her Motrin and her Advil for the next 3 weeks and then recheck liver function studies, as well as basal metabolic panel, and hepatitis, C. titer in the form of an anti-HCV

## 2011-07-11 ENCOUNTER — Other Ambulatory Visit: Payer: 59 | Admitting: *Deleted

## 2011-07-21 ENCOUNTER — Other Ambulatory Visit: Payer: 59 | Admitting: *Deleted

## 2011-08-21 ENCOUNTER — Other Ambulatory Visit: Payer: Self-pay | Admitting: *Deleted

## 2011-08-21 MED ORDER — ESOMEPRAZOLE MAGNESIUM 40 MG PO CPDR: 40.0000 mg | DELAYED_RELEASE_CAPSULE | Freq: Every day | ORAL | Status: DC

## 2011-10-18 ENCOUNTER — Other Ambulatory Visit: Payer: Self-pay | Admitting: *Deleted

## 2011-10-18 DIAGNOSIS — G4700 Insomnia, unspecified: Secondary | ICD-10-CM

## 2011-10-18 MED ORDER — CLONAZEPAM 1 MG PO TABS: ORAL_TABLET | ORAL | Status: DC

## 2011-10-18 NOTE — Telephone Encounter (Signed)
Refill on clonnazepam

## 2011-11-02 ENCOUNTER — Other Ambulatory Visit: Payer: Self-pay | Admitting: *Deleted

## 2011-11-02 DIAGNOSIS — G4700 Insomnia, unspecified: Secondary | ICD-10-CM

## 2011-11-02 NOTE — Telephone Encounter (Signed)
Refilled per fax request

## 2011-11-05 MED ORDER — CLONAZEPAM 1 MG PO TABS: ORAL_TABLET | ORAL | Status: DC

## 2012-01-02 ENCOUNTER — Telehealth: Payer: Self-pay | Admitting: Cardiology

## 2012-01-02 ENCOUNTER — Other Ambulatory Visit: Payer: Self-pay | Admitting: Cardiology

## 2012-01-02 MED ORDER — ESOMEPRAZOLE MAGNESIUM 40 MG PO CPDR: 40.0000 mg | DELAYED_RELEASE_CAPSULE | Freq: Every day | ORAL | Status: DC

## 2012-01-02 NOTE — Telephone Encounter (Signed)
Pt needs refill of nexium and hctz at [facility] pharmacy [phone]

## 2012-01-04 ENCOUNTER — Other Ambulatory Visit: Payer: Self-pay | Admitting: Orthopedic Surgery

## 2012-01-08 ENCOUNTER — Encounter (HOSPITAL_BASED_OUTPATIENT_CLINIC_OR_DEPARTMENT_OTHER): Payer: Self-pay | Admitting: *Deleted

## 2012-01-08 NOTE — H&P (Addendum)
  Brittany Washington is an 61 y.o. female.   Chief Complaint: c/o chronic and progressive triggering of her left long finger HPI: Patient is a 61 y/o female who sustained a severe comminuted fracture of her left wrist in May of 2010. She  Underwent surgical repair of the fracture and eventual post-op therapy. She represented in July 2012 c/o STS symptoms of her left long finger. WE tried conservative treatment and injections without success. She now requests release of her STS of the left long finger.  Past Medical History  Diagnosis Date  . GERD (gastroesophageal reflux disease)   . Hypothyroidism   . Arthritis     Past Surgical History  Procedure Date  . Cholecystectomy   . Abdominal hysterectomy   . Back surgery     lumb lamx2  . Back surgery     lumb fusion    No family history on file. Social History:  reports that she has never smoked. She does not have any smokeless tobacco history on file. She reports that she drinks alcohol. Her drug history not on file.  Allergies:  Allergies  Allergen Reactions  . Sulfa Antibiotics     No prescriptions prior to admission    No results found for this or any previous visit (from the past 48 hour(s)).  No results found.   A comprehensive review of systems was negative.  There were no vitals taken for this visit.  General appearance: alert Head: atraumatic Neck: no carotid bruit, no JVD, supple, symmetrical, trachea midline and thyroid not enlarged, symmetric, no tenderness/mass/nodules Resp: clear to auscultation bilaterally Cardio: regular rate and rhythm, S1, S2 normal, no murmur, click, rub or gallop GI: soft, non-tender; bowel sounds normal; no masses,  no organomegaly Extremities:Examination of her left long finger reveals active triggering of the left long finger at the A-1 pulley. She has FROM of the other digits with intact neurovascular status. Pulses: symmetric Skin: normal texture, no lesions Neurologic: Grossly  normal Incision/Wound: Not applicable  Assessment/Plan Impression: Chronic STS left long finger  Plan: TO the OR for release of her A-1 pulley of the left long finger. The procedure, risks and post-op course were discussed at length and she was in agreement with the plan.  Diedre Maclellan J 01/08/2012, 2:44 PM    H&P documentation: 01/09/2012  -History and Physical Reviewed  -Patient has been re-examined  -No change in the plan of care  Wyn Forster, MD

## 2012-01-08 NOTE — Progress Notes (Signed)
ekg done 10/12 Will need istat

## 2012-01-09 ENCOUNTER — Ambulatory Visit (HOSPITAL_BASED_OUTPATIENT_CLINIC_OR_DEPARTMENT_OTHER): Payer: PRIVATE HEALTH INSURANCE | Admitting: Certified Registered Nurse Anesthetist

## 2012-01-09 ENCOUNTER — Encounter (HOSPITAL_BASED_OUTPATIENT_CLINIC_OR_DEPARTMENT_OTHER): Payer: Self-pay | Admitting: Certified Registered Nurse Anesthetist

## 2012-01-09 ENCOUNTER — Encounter (HOSPITAL_BASED_OUTPATIENT_CLINIC_OR_DEPARTMENT_OTHER): Payer: Self-pay | Admitting: *Deleted

## 2012-01-09 ENCOUNTER — Encounter (HOSPITAL_BASED_OUTPATIENT_CLINIC_OR_DEPARTMENT_OTHER)
Admission: RE | Disposition: A | Discharge status: Home / Self Care | Payer: Self-pay | Source: Ambulatory Visit | Attending: Orthopedic Surgery

## 2012-01-09 ENCOUNTER — Ambulatory Visit (HOSPITAL_BASED_OUTPATIENT_CLINIC_OR_DEPARTMENT_OTHER)
Admission: RE | Admit: 2012-01-09 | Discharge: 2012-01-09 | Disposition: A | Discharge status: Home / Self Care | Payer: PRIVATE HEALTH INSURANCE | Source: Ambulatory Visit | Attending: Orthopedic Surgery | Admitting: Orthopedic Surgery

## 2012-01-09 DIAGNOSIS — K219 Gastro-esophageal reflux disease without esophagitis: Secondary | ICD-10-CM | POA: Insufficient documentation

## 2012-01-09 DIAGNOSIS — E039 Hypothyroidism, unspecified: Secondary | ICD-10-CM | POA: Insufficient documentation

## 2012-01-09 DIAGNOSIS — M65839 Other synovitis and tenosynovitis, unspecified forearm: Secondary | ICD-10-CM | POA: Insufficient documentation

## 2012-01-09 DIAGNOSIS — M65849 Other synovitis and tenosynovitis, unspecified hand: Secondary | ICD-10-CM | POA: Insufficient documentation

## 2012-01-09 HISTORY — DX: Hypothyroidism, unspecified: E03.9

## 2012-01-09 HISTORY — DX: Gastro-esophageal reflux disease without esophagitis: K21.9

## 2012-01-09 HISTORY — PX: TRIGGER FINGER RELEASE: SHX641

## 2012-01-09 LAB — POCT I-STAT, CHEM 8
Chloride: 109 mEq/L (ref 96–112)
Creatinine, Ser: 0.9 mg/dL (ref 0.50–1.10)
Glucose, Bld: 106 mg/dL — ABNORMAL HIGH (ref 70–99)
HCT: 41 % (ref 36.0–46.0)
Potassium: 4 mEq/L (ref 3.5–5.1)
Sodium: 142 mEq/L (ref 135–145)

## 2012-01-09 SURGERY — RELEASE, A1 PULLEY, FOR TRIGGER FINGER
Anesthesia: Monitor Anesthesia Care | Site: Hand | Laterality: Left | Wound class: Clean

## 2012-01-09 MED ORDER — LACTATED RINGERS IV SOLN
INTRAVENOUS | Status: DC
Start: 2012-01-09 — End: 2012-01-09
  Administered 2012-01-09: 08:00:00 via INTRAVENOUS

## 2012-01-09 MED ORDER — LIDOCAINE HCL (PF) 2 % IJ SOLN
INTRAMUSCULAR | Status: DC | PRN
  Administered 2012-01-09: 1.5 mL

## 2012-01-09 MED ORDER — CHLORHEXIDINE GLUCONATE 4 % EX LIQD: 60.0000 mL | Freq: Once | CUTANEOUS | Status: DC

## 2012-01-09 MED ORDER — LIDOCAINE HCL (CARDIAC) 20 MG/ML IV SOLN
INTRAVENOUS | Status: DC | PRN
  Administered 2012-01-09: 40 mg via INTRAVENOUS

## 2012-01-09 MED ORDER — MIDAZOLAM HCL 5 MG/5ML IJ SOLN
INTRAMUSCULAR | Status: DC | PRN
  Administered 2012-01-09: 1 mg via INTRAVENOUS

## 2012-01-09 MED ORDER — FENTANYL CITRATE 0.05 MG/ML IJ SOLN: 25.0000 ug | INTRAMUSCULAR | Status: DC | PRN

## 2012-01-09 MED ORDER — HYDROCODONE-ACETAMINOPHEN 5-325 MG PO TABS: 1.0000 | ORAL_TABLET | Freq: Four times a day (QID) | ORAL | Status: AC | PRN

## 2012-01-09 MED ORDER — ONDANSETRON HCL 4 MG/2ML IJ SOLN
INTRAMUSCULAR | Status: DC | PRN
  Administered 2012-01-09: 4 mg via INTRAVENOUS

## 2012-01-09 MED ORDER — FENTANYL CITRATE 0.05 MG/ML IJ SOLN
INTRAMUSCULAR | Status: DC | PRN
  Administered 2012-01-09: 50 ug via INTRAVENOUS

## 2012-01-09 MED ORDER — PROPOFOL 10 MG/ML IV EMUL
INTRAVENOUS | Status: DC | PRN
  Administered 2012-01-09: 75 ug/kg/min via INTRAVENOUS
  Administered 2012-01-09: 1000 ug/kg/min via INTRAVENOUS

## 2012-01-09 SURGICAL SUPPLY — 32 items
BLADE SURG 15 STRL LF DISP TIS (BLADE) ×1 IMPLANT
BLADE SURG 15 STRL SS (BLADE) ×2
BNDG CMPR 9X4 STRL LF SNTH (GAUZE/BANDAGES/DRESSINGS) ×1
BNDG CMPR MD 5X2 ELC HKLP STRL (GAUZE/BANDAGES/DRESSINGS) ×1
BNDG ELASTIC 2 VLCR STRL LF (GAUZE/BANDAGES/DRESSINGS) ×2 IMPLANT
BNDG ESMARK 4X9 LF (GAUZE/BANDAGES/DRESSINGS) ×1 IMPLANT
BRUSH SCRUB EZ PLAIN DRY (MISCELLANEOUS) ×2 IMPLANT
CLOTH BEACON ORANGE TIMEOUT ST (SAFETY) ×2 IMPLANT
CORDS BIPOLAR (ELECTRODE) ×2 IMPLANT
COVER MAYO STAND STRL (DRAPES) ×2 IMPLANT
COVER TABLE BACK 60X90 (DRAPES) ×2 IMPLANT
CUFF TOURNIQUET SINGLE 18IN (TOURNIQUET CUFF) ×2 IMPLANT
DECANTER SPIKE VIAL GLASS SM (MISCELLANEOUS) IMPLANT
DRAPE EXTREMITY T 121X128X90 (DRAPE) ×2 IMPLANT
DRAPE SURG 17X23 STRL (DRAPES) ×2 IMPLANT
GAUZE SPONGE 4X4 12PLY STRL LF (GAUZE/BANDAGES/DRESSINGS) ×4 IMPLANT
GAUZE XEROFORM 1X8 LF (GAUZE/BANDAGES/DRESSINGS) ×2 IMPLANT
GLOVE BIO SURGEON STRL SZ 6.5 (GLOVE) ×2 IMPLANT
GLOVE BIOGEL M STRL SZ7.5 (GLOVE) ×2 IMPLANT
GLOVE ORTHO TXT STRL SZ7.5 (GLOVE) ×2 IMPLANT
GOWN PREVENTION PLUS XLARGE (GOWN DISPOSABLE) ×2 IMPLANT
GOWN STRL REIN XL XLG (GOWN DISPOSABLE) ×4 IMPLANT
NEEDLE 27GAX1X1/2 (NEEDLE) ×1 IMPLANT
PACK BASIN DAY SURGERY FS (CUSTOM PROCEDURE TRAY) ×2 IMPLANT
PAD CAST 4YDX4 CTTN HI CHSV (CAST SUPPLIES) ×1 IMPLANT
PADDING CAST COTTON 4X4 STRL (CAST SUPPLIES) ×2
SPONGE GAUZE 4X4 12PLY (GAUZE/BANDAGES/DRESSINGS) ×2 IMPLANT
STOCKINETTE 4X48 STRL (DRAPES) ×2 IMPLANT
SYR CONTROL 10ML LL (SYRINGE) ×1 IMPLANT
TOWEL OR 17X24 6PK STRL BLUE (TOWEL DISPOSABLE) ×2 IMPLANT
UNDERPAD 30X30 INCONTINENT (UNDERPADS AND DIAPERS) ×2 IMPLANT
WATER STERILE IRR 1000ML POUR (IV SOLUTION) IMPLANT

## 2012-01-09 NOTE — Anesthesia Postprocedure Evaluation (Signed)
  Anesthesia Post-op Note  Patient: Brittany Washington  Procedure(s) Performed: Procedure(s) (LRB): RELEASE TRIGGER FINGER/A-1 PULLEY (Left)  Patient Location: PACU  Anesthesia Type: MAC  Level of Consciousness: awake, alert  and oriented  Airway and Oxygen Therapy: Patient Spontanous Breathing  Post-op Pain: none  Post-op Assessment: Post-op Vital signs reviewed, Patient's Cardiovascular Status Stable, Respiratory Function Stable, Patent Airway, No signs of Nausea or vomiting and Pain level controlled  Post-op Vital Signs: Reviewed and stable  Complications: No apparent anesthesia complications

## 2012-01-09 NOTE — Op Note (Signed)
OP NOTE DICTATED [id number]

## 2012-01-09 NOTE — H&P (Signed)
H&P documentation: 01/09/2012  -History and Physical Reviewed  -Patient has been re-examined  -No change in the plan of care  Wyn Forster, MD

## 2012-01-09 NOTE — Op Note (Signed)
NAMEKNIYAH, KHUN             ACCOUNT NO.:  [id number]  MEDICAL RECORD NO.:  [id number]  LOCATION:                                 FACILITY:  PHYSICIAN:  Katy Fitch. Auria Mckinlay, M.D.      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  OPERATION:  Exploration and release of left long finger A1 pulley and release of contracted palmar fascia proximal to A1 pulley.  OPERATING SURGEON:  Katy Fitch. Elga Santy, M.D.  ASSISTANT:  None.  ANESTHESIA:  2% lidocaine, 1.5 mL supplemented by IV sedation.  SUPERVISING ANESTHESIOLOGIST:  Germaine Pomfret, M.D.  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  She sustained a very serious fracture of her left distal radius at work 3 years prior and went on to have some postoperative challenges which included development of fracture blisters and a very severe keloid scar.  During her rehabilitation, she had a mild dystrophic response and subsequently developed stenosing tenosynovitis of her left long finger. We have followed her for a lengthy period of time advising her to use anti-inflammatory medications, therapeutic exercise, and steroid injection to try to resolve this predicament.  Brittany Washington was beginning to develop a flexion contracture of PIP joint and had been unresponsive to nonoperative measures.  Therefore at this time, she is brought to the operating  room anticipating release of her left long finger A1 pulley and dressing for fascial contracture.  Preoperatively, she was reminded potential risks and benefits of surgery.  Our plan is to proceed with surgery under local anesthesia and sedation.  Preoperatively, she was interviewed by Dr. Jairo Ben, attending anesthesiologist, who provided detailed informed consent.  Ms. Stoneberg suggested IV sedation.  PROCEDURE:  Brittany Washington was brought to room #2 of the [facility] and placed in supine position on the operating table.  Following routine informed consent, IV sedation was provided.  The left hand was then prepped with Betadine soap and solution, sterilely draped. Following routine surgical time-out, 1.5 mL of 2% lidocaine were infiltrated in the path intended incision and the flexor sheath at the site of the A1 pulley.  Confirming her allergy to SULFA, we proceeded with exsanguination of the left arm with an Esmarch bandage and inflation of arterial tourniquet on the proximal brachium to 250 mmHg due to mild systolic hypertension.  Procedure commenced with an oblique incision in the distal palmar crease directly over the palpably thickened A1 pulley region.  Subcutaneous tissues were carefully divided revealing early Dupuytren's fibromatosis-type thickening of the palmar fascia pretendinous fibers. These were resected.  The transverse band of the palmar fascia was contracted proximal to the A1 pulley, likely due to the prior fracture  dystrophic response.  This was meticulously dissected and released.  The A1 pulley was isolated, split with scalpel and scissors.  The flexor tendons were delivered and a thick cuff of fibrotic tenosynovium was identified and the superficialis tendon adherent to the profundus tendon.  This was resected with scissors and micro rongeur dissection. Thereafter, full range of motion of the finger was noted in flexion without  residual triggering.  With respect to the flexion contracture of the PIP joint, I flexed the MP joint 90 degrees in gently extended the PIP joint.  After release of the fibrous adhesions, the PIP was able to extend to neutral.  It still does not hyperextend as far as the adjacent index and ring fingers, however, with therapy this should improve substantially.  The wound was inspected for bleeding points, subsequently repaired with intradermal 3-0 Prolene and Steri-Strips.  A compressive dressing was applied with sterile gauze and Ace wrap.  I have advised Ms. Sun of her dressing in 48 hours to begin using Band-Aids directly on the wound.  We will see her back in followup in 1 week for suture removal.     Katy Fitch. Jermario Kalmar, M.D.     RVS/MEDQ  D:  01/09/2012  T:  01/09/2012  Job:  [id number]

## 2012-01-09 NOTE — Anesthesia Preprocedure Evaluation (Signed)
Anesthesia Evaluation  Patient identified by MRN, date of birth, ID band Patient awake    Reviewed: Allergy & Precautions, H&P , NPO status , Patient's Chart, lab work & pertinent test results  History of Anesthesia Complications Negative for: history of anesthetic complications  Airway Mallampati: I TM Distance: >3 FB Neck ROM: Full    Dental No notable dental hx. (+) Teeth Intact and Dental Advisory Given   Pulmonary neg pulmonary ROS,  breath sounds clear to auscultation  Pulmonary exam normal       Cardiovascular hypertension, Pt. on medications + Valvular Problems/Murmurs (patient reports normal ECHO years ago) Rhythm:Regular Rate:Normal     Neuro/Psych negative neurological ROS     GI/Hepatic Neg liver ROS, GERD-  Medicated and Controlled,  Endo/Other  Hypothyroidism (on replacement)   Renal/GU      Musculoskeletal   Abdominal   Peds  Hematology negative hematology ROS (+)   Anesthesia Other Findings   Reproductive/Obstetrics                           Anesthesia Physical Anesthesia Plan  ASA: II  Anesthesia Plan: MAC   Post-op Pain Management:    Induction:   Airway Management Planned: Simple Face Mask  Additional Equipment:   Intra-op Plan:   Post-operative Plan:   Informed Consent: I have reviewed the patients History and Physical, chart, labs and discussed the procedure including the risks, benefits and alternatives for the proposed anesthesia with the patient or authorized representative who has indicated his/her understanding and acceptance.   Dental advisory given  Plan Discussed with: CRNA and Surgeon  Anesthesia Plan Comments: (Plan routine monitors, MAC)        Anesthesia Quick Evaluation

## 2012-01-09 NOTE — Anesthesia Procedure Notes (Signed)
Procedure Name: MAC Date/Time: 01/09/2012 8:00 AM Performed by: Chrishon Martino D Pre-anesthesia Checklist: Patient identified, Emergency Drugs available, Suction available and Patient being monitored Patient Re-evaluated:Patient Re-evaluated prior to inductionOxygen Delivery Method: Simple face mask

## 2012-01-09 NOTE — Transfer of Care (Signed)
Immediate Anesthesia Transfer of Care Note  Patient: Brittany Washington  Procedure(s) Performed: Procedure(s) (LRB): RELEASE TRIGGER FINGER/A-1 PULLEY (Left)  Patient Location: PACU  Anesthesia Type: MAC  Level of Consciousness: awake, alert , oriented and patient cooperative  Airway & Oxygen Therapy: Patient Spontanous Breathing and Patient connected to face mask oxygen  Post-op Assessment: Report given to PACU RN and Post -op Vital signs reviewed and stable  Post vital signs: Reviewed and stable  Complications: No apparent anesthesia complications

## 2012-01-09 NOTE — Discharge Instructions (Signed)

## 2012-01-09 NOTE — Brief Op Note (Signed)
01/09/2012  8:26 AM  PATIENT:  Brittany Washington  61 y.o. female  PRE-OPERATIVE DIAGNOSIS:  Stenosing Tenosynovitis Left Long Finger  POST-OPERATIVE DIAGNOSIS:  Stenosing Tenosynovitis Left Long Finger  PROCEDURE: RELEASE TRIGGER FINGER/A-1 PULLEY LEFT LONG FINGER  SURGEON:  Wyn Forster., MD   PHYSICIAN ASSISTANT:   ASSISTANTS: SURGICAL TECH  ANESTHESIA:   IV sedation  EBL:     BLOOD ADMINISTERED:none  DRAINS: none   LOCAL MEDICATIONS USED:  XYLOCAINE   SPECIMEN:  No Specimen  DISPOSITION OF SPECIMEN:  N/A  COUNTS:  YES  TOURNIQUET:  * Missing tourniquet times found for documented tourniquets in log:  [id number] *  DICTATION: .Other Dictation: Dictation Number [id number]  PLAN OF CARE: Discharge to home after PACU  PATIENT DISPOSITION:  PACU - hemodynamically stable.

## 2012-01-10 ENCOUNTER — Encounter (HOSPITAL_BASED_OUTPATIENT_CLINIC_OR_DEPARTMENT_OTHER): Payer: Self-pay | Admitting: Orthopedic Surgery

## 2012-01-17 ENCOUNTER — Other Ambulatory Visit (HOSPITAL_COMMUNITY): Payer: Self-pay | Admitting: Obstetrics and Gynecology

## 2012-01-17 DIAGNOSIS — Z1231 Encounter for screening mammogram for malignant neoplasm of breast: Secondary | ICD-10-CM

## 2012-01-17 DIAGNOSIS — M8580 Other specified disorders of bone density and structure, unspecified site: Secondary | ICD-10-CM

## 2012-01-30 ENCOUNTER — Other Ambulatory Visit: Payer: Self-pay

## 2012-02-01 ENCOUNTER — Ambulatory Visit (INDEPENDENT_AMBULATORY_CARE_PROVIDER_SITE_OTHER): Payer: 59 | Admitting: *Deleted

## 2012-02-01 ENCOUNTER — Ambulatory Visit (INDEPENDENT_AMBULATORY_CARE_PROVIDER_SITE_OTHER): Payer: 59 | Admitting: Cardiology

## 2012-02-01 ENCOUNTER — Encounter: Payer: Self-pay | Admitting: Cardiology

## 2012-02-01 VITALS — BP 126/88 | HR 70 | Resp 18 | Wt 159.0 lb

## 2012-02-01 DIAGNOSIS — E039 Hypothyroidism, unspecified: Secondary | ICD-10-CM

## 2012-02-01 DIAGNOSIS — I119 Hypertensive heart disease without heart failure: Secondary | ICD-10-CM

## 2012-02-01 LAB — HEPATIC FUNCTION PANEL
Alkaline Phosphatase: 79 U/L (ref 39–117)
Bilirubin, Direct: 0 mg/dL (ref 0.0–0.3)
Total Bilirubin: 0.4 mg/dL (ref 0.3–1.2)
Total Protein: 7.2 g/dL (ref 6.0–8.3)

## 2012-02-01 LAB — LIPID PANEL
Cholesterol: 181 mg/dL (ref 0–200)
LDL Cholesterol: 109 mg/dL — ABNORMAL HIGH (ref 0–99)

## 2012-02-01 LAB — BASIC METABOLIC PANEL
BUN: 21 mg/dL (ref 6–23)
Chloride: 109 mEq/L (ref 96–112)
Creatinine, Ser: 0.8 mg/dL (ref 0.4–1.2)
Glucose, Bld: 103 mg/dL — ABNORMAL HIGH (ref 70–99)
Potassium: 4.1 mEq/L (ref 3.5–5.1)

## 2012-02-01 LAB — T4, FREE: Free T4: 0.91 ng/dL (ref 0.60–1.60)

## 2012-02-01 NOTE — Patient Instructions (Signed)
Your physician recommends that you continue on your current medications as directed. Please refer to the Current Medication list given to you today. Your physician wants you to follow-up in: 6 months with fasting labs You will receive a reminder letter in the mail two months in advance. If you don't receive a letter, please call our office to schedule the follow-up appointment.   

## 2012-02-01 NOTE — Progress Notes (Signed)
Val Eagle Date of Birth:  [birth year] [facility] [address] [place], [place]  [address] [phone]  Fax   [phone]  HPI: This pleasant 61 year old Caucasian female is seen for a scheduled 6 month followup office visit.  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  She also has a history of known heart murmur and a past history of abnormal liver function studies.  She has a history of mild dyslipidemia but is not on statins because of her abnormal liver function studies.  In the past her transaminases have been elevated significantly and have improved after she cut back on wine and on nonsteroidal anti-inflammatories such as ibuprofen.  She has been feeling well.  She is back at work.  Her energy level is good.  She's not having any chest pain or shortness of breath or palpitations.  She has not been on a careful diet her weight is up 5 pounds since last visit.  Current Outpatient Prescriptions  Medication Sig Dispense Refill  . aspirin 81 MG tablet Take 81 mg by mouth. Pt takes asa at least two times a week       . clonazePAM (KLONOPIN) 1 MG tablet 1 to 2 tabs at bedtime as needed  60 tablet  5  . esomeprazole (NEXIUM) 40 MG capsule Take 40 mg by mouth as needed.      . hydrochlorothiazide (MICROZIDE) 12.5 MG capsule TAKE 1 CAPSULE BY MOUTH DAILY  90 capsule  1  . ibuprofen (MOTRIN IB) 200 MG tablet Take 200 mg by mouth every 6 (six) hours as needed. If needed for wrist pain       . levothyroxine (SYNTHROID, LEVOTHROID) 112 MCG tablet TAKE 1 TABLET BY MOUTH DAILY  30 tablet  11  . DISCONTD: esomeprazole (NEXIUM) 40 MG capsule Take 1 capsule (40 mg total) by mouth daily before breakfast.  30 capsule  4    Allergies  Allergen Reactions  . Sulfa Antibiotics     Patient Active Problem List  Diagnoses  . Other abnormal blood chemistry  . Benign hypertensive heart disease without heart  failure  . Hypothyroid  . Heart murmur    History  Smoking status  . Never Smoker   Smokeless tobacco  . Not on file    History  Alcohol Use  . Yes    occ    No family history on file.  Review of Systems: The patient denies any heat or cold intolerance.  No weight gain or weight loss.  The patient denies headaches or blurry vision.  There is no cough or sputum production.  The patient denies dizziness.  There is no hematuria or hematochezia.  The patient denies any muscle aches or arthritis.  The patient denies any rash.  The patient denies frequent falling or instability.  There is no history of depression or anxiety.  All other systems were reviewed and are negative.   Physical Exam: Filed Vitals:   02/01/12 1557  BP: 126/88  Pulse: 70  Resp: 18   the general appearance reveals a well-developed well-nourished woman in no distress.The head and neck exam reveals pupils equal and reactive.  Extraocular movements are full.  There is no scleral icterus.  The mouth and pharynx are normal.  The neck is supple.  The carotids reveal no bruits.  The jugular venous pressure is normal.  The  thyroid is not enlarged.  There is no lymphadenopathy.  The chest is clear to percussion and auscultation.  There are no rales or rhonchi.  Expansion of the chest is symmetrical.  The precordium is quiet.  The first heart sound is normal.  The second heart sound is physiologically split.  There is no  gallop rub or click.  There is a soft grade 1/6 systolic ejection murmur at the left sternal edge.  There is no abnormal lift or heave.  The abdomen is soft and nontender.  The bowel sounds are normal.  The liver and spleen are not enlarged.  There are no abdominal masses.  There are no abdominal bruits.  Extremities reveal good pedal pulses.  There is no phlebitis or edema.  There is no cyanosis or clubbing.  Strength is normal and symmetrical in all extremities.  There is no lateralizing weakness.  There are  no sensory deficits.  The skin is warm and dry.  There is no rash.      Assessment / Plan: Continue same medication.  Work harder to lose weight since she has gained 5 pounds since we last saw her.  Recheck in 6 months for followup office visit fasting lipid panel hepatic function panel nasal metabolic panel TSH and free T4

## 2012-02-01 NOTE — Assessment & Plan Note (Signed)
The patient is clinically euthyroid.  Her blood work today confirms that and she will stay on her same dose of

## 2012-02-01 NOTE — Assessment & Plan Note (Signed)
Pressure remained stable on low-dose hydrochlorothiazide

## 2012-02-07 ENCOUNTER — Other Ambulatory Visit: Payer: Self-pay | Admitting: Cardiology

## 2012-02-15 ENCOUNTER — Ambulatory Visit (HOSPITAL_COMMUNITY)
Admission: RE | Admit: 2012-02-15 | Discharge: 2012-02-15 | Disposition: A | Discharge status: Home / Self Care | Payer: 59 | Source: Ambulatory Visit | Attending: Obstetrics and Gynecology | Admitting: Obstetrics and Gynecology

## 2012-02-15 DIAGNOSIS — M8580 Other specified disorders of bone density and structure, unspecified site: Secondary | ICD-10-CM

## 2012-02-15 DIAGNOSIS — Z1231 Encounter for screening mammogram for malignant neoplasm of breast: Secondary | ICD-10-CM | POA: Insufficient documentation

## 2012-02-15 DIAGNOSIS — Z13820 Encounter for screening for osteoporosis: Secondary | ICD-10-CM | POA: Insufficient documentation

## 2012-02-15 DIAGNOSIS — Z780 Asymptomatic menopausal state: Secondary | ICD-10-CM | POA: Insufficient documentation

## 2012-02-23 ENCOUNTER — Telehealth: Payer: Self-pay | Admitting: Cardiology

## 2012-02-23 NOTE — Telephone Encounter (Signed)
Left message with husband for Mrs. Roehrs to call me back at [phone]. Burnadette Pop, CMA

## 2012-02-26 ENCOUNTER — Other Ambulatory Visit: Payer: Self-pay | Admitting: Cardiology

## 2012-02-26 DIAGNOSIS — G4700 Insomnia, unspecified: Secondary | ICD-10-CM

## 2012-02-26 MED ORDER — CLONAZEPAM 1 MG PO TABS: ORAL_TABLET | ORAL | Status: DC

## 2012-02-26 NOTE — Telephone Encounter (Signed)
Refilled clonazepam.

## 2012-02-29 ENCOUNTER — Telehealth: Payer: Self-pay | Admitting: Cardiology

## 2012-02-29 NOTE — Telephone Encounter (Signed)
New msg Pt wants to talk to you about what med she can take for osteoporosis. Please call

## 2012-04-01 ENCOUNTER — Other Ambulatory Visit: Payer: Self-pay | Admitting: Cardiology

## 2012-05-30 ENCOUNTER — Other Ambulatory Visit: Payer: Self-pay | Admitting: Cardiology

## 2012-05-30 NOTE — Telephone Encounter (Signed)
Refilled nexium 

## 2012-06-04 ENCOUNTER — Encounter: Payer: Self-pay | Admitting: Internal Medicine

## 2012-08-07 ENCOUNTER — Other Ambulatory Visit: Payer: Self-pay

## 2012-08-07 MED ORDER — LEVOTHYROXINE SODIUM 112 MCG PO TABS: 112.0000 ug | ORAL_TABLET | Freq: Every day | ORAL | Status: DC

## 2012-08-27 ENCOUNTER — Other Ambulatory Visit: Payer: Self-pay | Admitting: Cardiology

## 2012-08-27 DIAGNOSIS — G4700 Insomnia, unspecified: Secondary | ICD-10-CM

## 2012-08-27 MED ORDER — CLONAZEPAM 1 MG PO TABS: ORAL_TABLET | ORAL | Status: DC

## 2012-08-27 NOTE — Telephone Encounter (Signed)
Pharmacy [facility]) request for CLONAZEPAM 1 MG.  Caralee Ates, CMA

## 2012-09-03 ENCOUNTER — Other Ambulatory Visit: Payer: Self-pay

## 2012-09-03 MED ORDER — PANTOPRAZOLE SODIUM 40 MG PO TBEC: 40.0000 mg | DELAYED_RELEASE_TABLET | Freq: Every day | ORAL | Status: DC

## 2012-11-27 ENCOUNTER — Other Ambulatory Visit: Payer: Self-pay | Admitting: *Deleted

## 2012-11-27 DIAGNOSIS — E039 Hypothyroidism, unspecified: Secondary | ICD-10-CM

## 2012-11-27 DIAGNOSIS — I119 Hypertensive heart disease without heart failure: Secondary | ICD-10-CM

## 2013-01-14 ENCOUNTER — Other Ambulatory Visit: Payer: Self-pay | Admitting: Cardiology

## 2013-01-14 DIAGNOSIS — R059 Cough, unspecified: Secondary | ICD-10-CM

## 2013-01-14 DIAGNOSIS — R509 Fever, unspecified: Secondary | ICD-10-CM

## 2013-01-14 DIAGNOSIS — R05 Cough: Secondary | ICD-10-CM

## 2013-01-14 NOTE — Telephone Encounter (Signed)
Okay to call in a Z-Pak as directed.

## 2013-01-14 NOTE — Telephone Encounter (Signed)
Patient's husband called because pt has been having a cough for a week. Pt has tried over the counter medication, and nothing has help. Husband is requesting a Z Pack or something for her mucous. Pt's husband states pt does not have a PCP and Dr. Patty Sermons is her PCP.

## 2013-01-14 NOTE — Telephone Encounter (Signed)
New Prob     Pt has had a bad cough for a weak. Has tried over the counter medications and still has the cough. Requesting a prescription for Z Pack or something to break up mucous.

## 2013-01-14 NOTE — Telephone Encounter (Signed)
Pt states she has been having nasal drainage, aches and possible low grade fever.  Requesting Z pack be called into [facility].  Denies possible allergy s/s and only wants an RX.  States she has tried Delsym and Mucinex without relief of night time cough (non productive).  Aware Dr Patty Sermons nor Juliette Alcide are here today but will forward to them for review and order

## 2013-01-14 NOTE — Telephone Encounter (Signed)
New Prob    Following up on call from earlier regarding wanting a prescription for a Z Pack. Would like to speak to nurse.

## 2013-01-15 MED ORDER — AZITHROMYCIN 250 MG PO TABS: ORAL_TABLET | ORAL | Status: DC

## 2013-01-15 MED ORDER — HYDROCOD POLST-CHLORPHEN POLST 10-8 MG/5ML PO LQCR: 5.0000 mL | Freq: Two times a day (BID) | ORAL | Status: DC | PRN

## 2013-01-15 NOTE — Telephone Encounter (Signed)
Advised patient. She did request Rx for cough as well. Discussed with  Dr. Patty Sermons and ok to Rx Tussionex 4 oz

## 2013-01-15 NOTE — Telephone Encounter (Signed)
New Problem:    Patient called in wanting to know if you received Pam's message message.  Please call back.

## 2013-01-21 ENCOUNTER — Other Ambulatory Visit (INDEPENDENT_AMBULATORY_CARE_PROVIDER_SITE_OTHER): Payer: 59

## 2013-01-21 DIAGNOSIS — E039 Hypothyroidism, unspecified: Secondary | ICD-10-CM

## 2013-01-21 DIAGNOSIS — I119 Hypertensive heart disease without heart failure: Secondary | ICD-10-CM

## 2013-01-21 LAB — HEPATIC FUNCTION PANEL
ALT: 60 U/L — ABNORMAL HIGH (ref 0–35)
Bilirubin, Direct: 0 mg/dL (ref 0.0–0.3)
Total Bilirubin: 0.4 mg/dL (ref 0.3–1.2)

## 2013-01-21 LAB — BASIC METABOLIC PANEL
BUN: 17 mg/dL (ref 6–23)
CO2: 26 mEq/L (ref 19–32)
Chloride: 105 mEq/L (ref 96–112)
GFR: 58.02 mL/min — ABNORMAL LOW (ref >60.00)
Glucose, Bld: 95 mg/dL (ref 70–99)
Potassium: 4.1 mEq/L (ref 3.5–5.1)
Sodium: 138 mEq/L (ref 135–145)

## 2013-01-21 LAB — LIPID PANEL
HDL: 38.5 mg/dL — ABNORMAL LOW (ref >39.00)
Total CHOL/HDL Ratio: 4
Triglycerides: 220 mg/dL — ABNORMAL HIGH (ref 0.0–149.0)
VLDL: 44 mg/dL — ABNORMAL HIGH (ref 0.0–40.0)

## 2013-01-21 LAB — LDL CHOLESTEROL, DIRECT: Direct LDL: 78.9 mg/dL

## 2013-01-28 ENCOUNTER — Other Ambulatory Visit: Payer: Self-pay

## 2013-01-31 ENCOUNTER — Ambulatory Visit (INDEPENDENT_AMBULATORY_CARE_PROVIDER_SITE_OTHER): Payer: 59 | Admitting: Cardiology

## 2013-01-31 ENCOUNTER — Encounter: Payer: Self-pay | Admitting: Cardiology

## 2013-01-31 VITALS — BP 120/78 | HR 72 | Ht 64.0 in | Wt 164.4 lb

## 2013-01-31 DIAGNOSIS — E039 Hypothyroidism, unspecified: Secondary | ICD-10-CM

## 2013-01-31 DIAGNOSIS — I119 Hypertensive heart disease without heart failure: Secondary | ICD-10-CM

## 2013-01-31 DIAGNOSIS — R011 Cardiac murmur, unspecified: Secondary | ICD-10-CM

## 2013-01-31 NOTE — Assessment & Plan Note (Signed)
Patient has not had any symptoms referable to her systolic heart murmur.  No chest pain dizziness or syncope

## 2013-01-31 NOTE — Patient Instructions (Addendum)
Your physician recommends that you continue on your current medications as directed. Please refer to the Current Medication list given to you today.  Your physician wants you to follow-up in: 6 months with fasting labs (lp/bmet/hfp/tsh/ft4) You will receive a reminder letter in the mail two months in advance. If you don't receive a letter, please call our office to schedule the follow-up appointment.  

## 2013-01-31 NOTE — Progress Notes (Signed)
Val Eagle Date of Birth:  1950-09-19 [facility] [address] [place], [place]  [address] [phone]  Fax   [phone]  HPI: This pleasant 62 year old Caucasian female is seen for a scheduled 12 month followup office visit. 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 She also has a history of known heart murmur and a past history of abnormal liver function studies. She has a history of mild dyslipidemia but is not on statins because of her abnormal liver function studies. In the past her transaminases have been elevated significantly and have improved after she cut back on wine and on nonsteroidal anti-inflammatories such as ibuprofen. She has been feeling well. She is back at work. Her energy level is good. She's not having any chest pain or shortness of breath or palpitations. She has not been on a careful diet her weight is up 5 pounds since last visit.  Her gynecologist has her on Evista for osteoporosis prevention and treatment.   Current Outpatient Prescriptions  Medication Sig Dispense Refill  . aspirin 81 MG tablet Take 81 mg by mouth. Pt takes asa at least two times a week       . cetirizine (ZYRTEC) 10 MG tablet Take 10 mg by mouth daily.      . clonazePAM (KLONOPIN) 1 MG tablet 1 to 2 tabs at bedtime as needed  60 tablet  5  . hydrochlorothiazide (MICROZIDE) 12.5 MG capsule TAKE 1 CAPSULE BY MOUTH DAILY  90 capsule  3  . ibuprofen (MOTRIN IB) 200 MG tablet Take 200 mg by mouth every 6 (six) hours as needed. If needed for wrist pain       . levothyroxine (SYNTHROID, LEVOTHROID) 112 MCG tablet Take 1 tablet (112 mcg total) by mouth daily.  90 tablet  3  . pantoprazole (PROTONIX) 40 MG tablet Take 1 tablet (40 mg total) by mouth daily.  90 tablet  3  . raloxifene (EVISTA) 60 MG tablet Take 60 mg by mouth daily.       No current facility-administered medications for this visit.     Allergies  Allergen Reactions  . Sulfa Antibiotics     Patient Active Problem List   Diagnosis Date Noted  . Other abnormal blood chemistry 06/13/2011  . Benign hypertensive heart disease without heart failure 06/13/2011  . Hypothyroid 06/13/2011  . Heart murmur 06/13/2011    History  Smoking status  . Never Smoker   Smokeless tobacco  . Not on file    History  Alcohol Use  . Yes    Comment: occ    No family history on file.  Review of Systems: The patient denies any heat or cold intolerance.  No weight gain or weight loss.  The patient denies headaches or blurry vision.  There is no cough or sputum production.  The patient denies dizziness.  There is no hematuria or hematochezia.  The patient denies any muscle aches or arthritis.  The patient denies any rash.  The patient denies frequent falling or instability.  There is no history of depression or anxiety.  All other systems were reviewed and are negative.   Physical Exam: Filed Vitals:   01/31/13 1439  BP: 120/78  Pulse: 72   the general appearance reveals a well-developed well-nourished middle-aged woman in no distress.The head and neck exam reveals pupils equal and reactive.  Extraocular movements are full.  There is no scleral icterus.  The  mouth and pharynx are normal.  The neck is supple.  The carotids reveal no bruits.  The jugular venous pressure is normal.  The  thyroid is not enlarged.  There is no lymphadenopathy.  The chest is clear to percussion and auscultation.  There are no rales or rhonchi.  Expansion of the chest is symmetrical.  The precordium is quiet.  The first heart sound is normal.  The second heart sound is physiologically split.  There is no  gallop rub or click.  There is a soft systolic ejection murmur at the base. There is no abnormal lift or heave.  The abdomen is soft and nontender.  The bowel sounds are normal.  The liver and spleen are not enlarged.  There are no abdominal masses.  There  are no abdominal bruits.  Extremities reveal good pedal pulses.  There is no phlebitis or edema.  There is no cyanosis or clubbing.  Strength is normal and symmetrical in all extremities.  There is no lateralizing weakness.  There are no sensory deficits.  The skin is warm and dry.  There is no rash.      Assessment / Plan: The patient is to continue same medication.  She is discouraged about her weight gain.  The Evista may be contributing.  She is no longer hypothyroid and her TSH and free T4 are normal on therapy now.

## 2013-01-31 NOTE — Assessment & Plan Note (Signed)
Blood pressure has been remaining stable on present medication

## 2013-01-31 NOTE — Assessment & Plan Note (Signed)
The patient is clinically euthyroid on current therapy.  Blood tests confirm this.

## 2013-02-11 ENCOUNTER — Other Ambulatory Visit: Payer: Self-pay | Admitting: *Deleted

## 2013-02-11 MED ORDER — AZITHROMYCIN 250 MG PO TABS: ORAL_TABLET | ORAL | Status: DC

## 2013-02-11 NOTE — Telephone Encounter (Signed)
Ok to fill per  Dr. Patty Sermons

## 2013-02-13 ENCOUNTER — Other Ambulatory Visit: Payer: Self-pay | Admitting: *Deleted

## 2013-02-13 DIAGNOSIS — R059 Cough, unspecified: Secondary | ICD-10-CM

## 2013-02-13 DIAGNOSIS — R05 Cough: Secondary | ICD-10-CM

## 2013-02-13 MED ORDER — HYDROCOD POLST-CHLORPHEN POLST 10-8 MG/5ML PO LQCR: 5.0000 mL | Freq: Two times a day (BID) | ORAL | Status: DC | PRN

## 2013-02-21 ENCOUNTER — Encounter: Payer: Self-pay | Admitting: Cardiology

## 2013-02-26 ENCOUNTER — Other Ambulatory Visit: Payer: Self-pay | Admitting: Cardiology

## 2013-02-26 DIAGNOSIS — F419 Anxiety disorder, unspecified: Secondary | ICD-10-CM

## 2013-03-14 ENCOUNTER — Other Ambulatory Visit (HOSPITAL_COMMUNITY): Payer: Self-pay | Admitting: Obstetrics and Gynecology

## 2013-03-14 DIAGNOSIS — Z1231 Encounter for screening mammogram for malignant neoplasm of breast: Secondary | ICD-10-CM

## 2013-03-27 ENCOUNTER — Ambulatory Visit (HOSPITAL_COMMUNITY)
Admission: RE | Admit: 2013-03-27 | Discharge: 2013-03-27 | Disposition: A | Discharge status: Home / Self Care | Payer: 59 | Source: Ambulatory Visit | Attending: Obstetrics and Gynecology | Admitting: Obstetrics and Gynecology

## 2013-03-27 DIAGNOSIS — Z1231 Encounter for screening mammogram for malignant neoplasm of breast: Secondary | ICD-10-CM | POA: Insufficient documentation

## 2013-06-30 LAB — COMPREHENSIVE METABOLIC PANEL
ALT: 90 U/L — ABNORMAL HIGH (ref 0–35)
AST: 57 U/L — ABNORMAL HIGH (ref 0–37)
Albumin: 4 g/dL (ref 3.5–5.2)
Alkaline Phosphatase: 89 U/L (ref 39–117)
BUN: 15 mg/dL (ref 6–23)
Calcium: 9.6 mg/dL (ref 8.4–10.5)
Chloride: 96 mEq/L (ref 96–112)
Potassium: 3.4 mEq/L — ABNORMAL LOW (ref 3.5–5.1)
Sodium: 135 mEq/L (ref 135–145)
Total Bilirubin: 0.3 mg/dL (ref 0.3–1.2)
Total Protein: 7.8 g/dL (ref 6.0–8.3)

## 2013-06-30 LAB — CBC WITH DIFFERENTIAL/PLATELET
Basophils Absolute: 0 10*3/uL (ref 0.0–0.1)
Basophils Relative: 0 % (ref 0–1)
Eosinophils Absolute: 0.2 10*3/uL (ref 0.0–0.7)
Eosinophils Relative: 2 % (ref 0–5)
HCT: 36.9 % (ref 36.0–46.0)
Hemoglobin: 12.9 g/dL (ref 12.0–15.0)
MCH: 33.2 pg (ref 26.0–34.0)
MCHC: 35 g/dL (ref 30.0–36.0)
Monocytes Relative: 10 % (ref 3–12)
Neutro Abs: 5.5 10*3/uL (ref 1.7–7.7)
Neutrophils Relative %: 60 % (ref 43–77)
Platelets: 230 10*3/uL (ref 150–400)
RBC: 3.89 MIL/uL (ref 3.87–5.11)

## 2013-07-07 ENCOUNTER — Encounter (HOSPITAL_BASED_OUTPATIENT_CLINIC_OR_DEPARTMENT_OTHER): Payer: Self-pay | Admitting: *Deleted

## 2013-07-08 ENCOUNTER — Other Ambulatory Visit: Payer: Self-pay

## 2013-07-08 ENCOUNTER — Encounter (HOSPITAL_BASED_OUTPATIENT_CLINIC_OR_DEPARTMENT_OTHER): Payer: Self-pay | Admitting: *Deleted

## 2013-07-08 MED ORDER — HYDROCHLOROTHIAZIDE 12.5 MG PO CAPS: ORAL_CAPSULE | ORAL | Status: DC

## 2013-07-08 NOTE — Progress Notes (Signed)
NPO AFTER MN. ARRIVE AT 0600. NEEDS ISTAT. CURRENT LAB RESULTS AND EKG IN EPIC AND CHART. WILL TAKE PROTONIX AM DOS W/ SIPS OF WATER.

## 2013-07-10 ENCOUNTER — Encounter (HOSPITAL_BASED_OUTPATIENT_CLINIC_OR_DEPARTMENT_OTHER): Payer: Self-pay | Admitting: Podiatry

## 2013-07-10 NOTE — H&P (Signed)
Visit Note - Office Visit   Provider: Sherlynn Stalls, DPM Encounter Date: May 27, 2013  Patient: Jadira, Nierman    ([id number]) Sex: Female       DOB: [birth year]      Age: 62 year 64 month       Race: White Address: [address] [place],  [place]  [place]  [address] Primary Dr.: Cassell Clement Insurance: [id number]  Referred By:  Cassell Clement  Complaint: Chief Complaint: bunion(s); hammertoe(s).  History of present illness: Nature: aching. Location: Left 2nd toe, 1st metatarsophalangeal joint. Duration: 2 year(s). Onset: (Acute vs Insidious): Sudden. Character/Pain Level: 4 on a 10-point scale. Aggravating factors: shoe gear. Patient denies diabetes.  Current Medication: 1. Calcium 500 Mg Chewable Tablet 500 Mg Calcium (1,250 Mg) (Other MD)  2. Evista 60 Mg Tablet (Other MD)  3. Hct2 (Other MD)   ROS: Integument: Negative for rashes, skin cancer, slow healing wounds, cellulitis. Musculoskeletal:  (+) for low back pain. Constitutional: No fever, chills, or nausea.  Medical History: High Blood Pressure.  Osteoporosis.  Thyroid Problems.  Vascular grafts: None.  Joint implant(s): None.  Heart valve(s): None.  Chemotherapy: No.   Surgeries: Back surgery x3 2005-2006, (L) Wrist surgery 2010, Hysterectomy 1993, Cholecystectomy 1995. No complications.     .  Family History/Social History: Cancer: Mother. Foot Problems: Father. Heart Attack: Father.  Patient denies being currently pregnant.  Smoking History: Patient denies tobacco use. Alcohol Use: Patient denies alcohol use. INSULIN: Patient denies taking insulin. COUMADIN/PLAVIX USE: Patient denies taking coumadin/plavix.  Allergy: Sulfa (Sulfonamide Antibiotics)  Examination: Patient is alert and oriented times three. Patient is in no distress and looks stated age with good attention to grooming. Vascular status intact with palpable pulses and no significant lower leg edema. Light touch intact with  normal reflexes. Adequate muscle strength and range of motion in the ankles and toes. Skin is warm and well perfused.  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 Bursal sac is present over the bunion deformity. No crepitation with range of motion. Hallux abductus interphalangeus present as well with cross over second toe deformity. Pain is mainly over the bony prominence at the metatarsal head. HAV deformity is painful. HAMMERTOES: Hammertoe deformities are noted with contracted flexor and extensor tendons. Specifically involving LEFT 2ND  digit. Contracted, plantar flexed PIP and DIP joints. Pain is present secondary to the deformities. Bursa formation at the PIP joint(s).  Diagnosis: 729.5  Pain in Limb  110.1  Dermatophytosis of Nail  727.06  Tenosynovitis of Foot and Ankle   PLAN: Clinical Summary Letter made available to patient within three business days. This summary is a preliminary report and not a chart note. Information may not be finalized. For complete chart noted from the medical record please call to request.  Initial office visit to take a history, form a diagnosis and a treatment plan  Diagnosis: Pain  TREATMENT PLAN: Surgical Consultation: Discussed surgical versus conservative treatment options. Risks versus benefits were discussed along with the nature of the procedure, post-operative course. Discussed possible complications, not limited to, such as: slow-healing, infection, need for further surgery, chronic pain (RSDS), blood clots, bleeding problems, weakness, chronic swelling of the foot/digits, and arthritis. Rare but serious complications can even result in loss of digit, loss of limb, or loss of life. No guarantees were given. Alternatives to surgery were discussed today.  PLANNED PROCEDURE(S). Left: Akin. Austin. Hammertoe Repair, arthrodesis, Left 2nd.   We will begin planning for scheduling  the surgery based on patient's preference.  She brought x-rays with  her and we reviewed these. She has increased intermetatarsal angle with tibial sesamoid position four. She has long second toe and long second metatarsal. We talked about a distal osteotomy of the first metatarsal and osteotomy of the proximal phalanx to try to eliminate her crossover toe problem and eliminate bump pain. Her main goal is to wear a closed in shoe comfortably. She was offered a base wedge osteotomy to try to get more correction of the intermetatarsal angle but she wanted to consider something with a shorter recovery. Therefore we proceeded to discuss this type of procedure. We talked about making the deformity much better but not straightening the toe out completely and not reducing the intermetatarsal angle completely. She is a Engineer, civil (consulting) and works at [employer] and understands the goals of the surgery today. Venipuncture: Labs drawn for Surgical profile.  Supplies: PNEUMATIC CAM WALKER:  Dispensed and fitted one, left. Indications: (reduce pressure at the surgical area by immobilizing flexion and extension at the ankle as well as reducing plantar pressure forces via the rocker bottom outsole)  Patient/caregiver advised on the use of the device and donning and doffing and sized accordingly. Boot has an air bladder for maximum support and stabilization and simulates fiberglass cast application allowing patient to remove for bathing and range of motion exercises to reduce likely hood of DVT .  DME Prescriptions: Rx: Pneumatic Cam Walker Disp: Left  Sig: Use as directed while weightbearing. Can remove for range of motion and bathing.   This visit note has been electronically signed off by Sherlynn Stalls, DPM.    She obtained medical clearance and wishes to proceed with outpatient surgery.

## 2013-07-14 ENCOUNTER — Ambulatory Visit (HOSPITAL_BASED_OUTPATIENT_CLINIC_OR_DEPARTMENT_OTHER)
Admission: RE | Admit: 2013-07-14 | Discharge: 2013-07-14 | Disposition: A | Discharge status: Home / Self Care | Payer: 59 | Source: Ambulatory Visit | Attending: Podiatry | Admitting: Podiatry

## 2013-07-14 ENCOUNTER — Encounter (HOSPITAL_BASED_OUTPATIENT_CLINIC_OR_DEPARTMENT_OTHER): Payer: Self-pay | Admitting: *Deleted

## 2013-07-14 ENCOUNTER — Encounter (HOSPITAL_BASED_OUTPATIENT_CLINIC_OR_DEPARTMENT_OTHER): Payer: 59 | Admitting: Anesthesiology

## 2013-07-14 ENCOUNTER — Encounter (HOSPITAL_BASED_OUTPATIENT_CLINIC_OR_DEPARTMENT_OTHER)
Admission: RE | Disposition: A | Discharge status: Home / Self Care | Payer: Self-pay | Source: Ambulatory Visit | Attending: Podiatry

## 2013-07-14 ENCOUNTER — Ambulatory Visit (HOSPITAL_BASED_OUTPATIENT_CLINIC_OR_DEPARTMENT_OTHER): Payer: 59 | Admitting: Anesthesiology

## 2013-07-14 DIAGNOSIS — M659 Synovitis and tenosynovitis, unspecified: Secondary | ICD-10-CM | POA: Insufficient documentation

## 2013-07-14 DIAGNOSIS — M2010 Hallux valgus (acquired), unspecified foot: Secondary | ICD-10-CM | POA: Insufficient documentation

## 2013-07-14 DIAGNOSIS — M2040 Other hammer toe(s) (acquired), unspecified foot: Secondary | ICD-10-CM | POA: Insufficient documentation

## 2013-07-14 DIAGNOSIS — Z79899 Other long term (current) drug therapy: Secondary | ICD-10-CM | POA: Insufficient documentation

## 2013-07-14 DIAGNOSIS — M79609 Pain in unspecified limb: Secondary | ICD-10-CM | POA: Insufficient documentation

## 2013-07-14 DIAGNOSIS — M810 Age-related osteoporosis without current pathological fracture: Secondary | ICD-10-CM | POA: Insufficient documentation

## 2013-07-14 DIAGNOSIS — R030 Elevated blood-pressure reading, without diagnosis of hypertension: Secondary | ICD-10-CM | POA: Insufficient documentation

## 2013-07-14 DIAGNOSIS — M65979 Unspecified synovitis and tenosynovitis, unspecified ankle and foot: Secondary | ICD-10-CM | POA: Insufficient documentation

## 2013-07-14 DIAGNOSIS — M205X9 Other deformities of toe(s) (acquired), unspecified foot: Secondary | ICD-10-CM

## 2013-07-14 DIAGNOSIS — B351 Tinea unguium: Secondary | ICD-10-CM | POA: Insufficient documentation

## 2013-07-14 HISTORY — DX: Cardiac murmur, unspecified: R01.1

## 2013-07-14 HISTORY — DX: Hypertensive heart disease without heart failure: I11.9

## 2013-07-14 HISTORY — PX: HAMMER TOE SURGERY: SHX385

## 2013-07-14 LAB — POCT I-STAT, CHEM 8
BUN: 24 mg/dL — ABNORMAL HIGH (ref 6–23)
Calcium, Ion: 1.19 mmol/L (ref 1.13–1.30)
Creatinine, Ser: 1.1 mg/dL (ref 0.50–1.10)
Glucose, Bld: 110 mg/dL — ABNORMAL HIGH (ref 70–99)
Sodium: 141 mEq/L (ref 135–145)
TCO2: 22 mmol/L (ref 0–100)

## 2013-07-14 SURGERY — CORRECTION, HAMMER TOE
Anesthesia: Monitor Anesthesia Care | Site: Foot | Laterality: Left | Wound class: Clean

## 2013-07-14 MED ORDER — ROPIVACAINE HCL 5 MG/ML IJ SOLN
INTRAMUSCULAR | Status: DC | PRN
  Administered 2013-07-14: 20 mL

## 2013-07-14 MED ORDER — KETOROLAC TROMETHAMINE 30 MG/ML IJ SOLN
15.0000 mg | Freq: Once | INTRAMUSCULAR | Status: DC | PRN
  Filled 2013-07-14: qty 1

## 2013-07-14 MED ORDER — FENTANYL CITRATE 0.05 MG/ML IJ SOLN
50.0000 ug | Freq: Once | INTRAMUSCULAR | Status: AC
  Administered 2013-07-14: 50 ug via INTRAVENOUS
  Filled 2013-07-14: qty 1

## 2013-07-14 MED ORDER — LACTATED RINGERS IV SOLN
INTRAVENOUS | Status: DC
  Administered 2013-07-14 (×2): via INTRAVENOUS
  Filled 2013-07-14: qty 1000

## 2013-07-14 MED ORDER — PROPOFOL 10 MG/ML IV EMUL
INTRAVENOUS | Status: DC | PRN
  Administered 2013-07-14: 100 ug/kg/min via INTRAVENOUS

## 2013-07-14 MED ORDER — FENTANYL CITRATE 0.05 MG/ML IJ SOLN
100.0000 ug | Freq: Once | INTRAMUSCULAR | Status: AC
  Administered 2013-07-14: 100 ug via INTRAVENOUS
  Filled 2013-07-14: qty 2

## 2013-07-14 MED ORDER — MIDAZOLAM HCL 2 MG/2ML IJ SOLN
2.0000 mg | Freq: Once | INTRAMUSCULAR | Status: AC
  Administered 2013-07-14: 2 mg via INTRAVENOUS
  Filled 2013-07-14: qty 2

## 2013-07-14 MED ORDER — MIDAZOLAM HCL 5 MG/5ML IJ SOLN
INTRAMUSCULAR | Status: DC | PRN
  Administered 2013-07-14: 1 mg via INTRAVENOUS

## 2013-07-14 MED ORDER — PROMETHAZINE HCL 25 MG/ML IJ SOLN
6.2500 mg | INTRAMUSCULAR | Status: DC | PRN
  Filled 2013-07-14: qty 1

## 2013-07-14 MED ORDER — KETOROLAC TROMETHAMINE 30 MG/ML IJ SOLN
30.0000 mg | Freq: Once | INTRAMUSCULAR | Status: DC
  Filled 2013-07-14: qty 1

## 2013-07-14 MED ORDER — CEFAZOLIN SODIUM-DEXTROSE 2-3 GM-% IV SOLR
2.0000 g | INTRAVENOUS | Status: AC
  Administered 2013-07-14: 2 g via INTRAVENOUS
  Filled 2013-07-14: qty 50

## 2013-07-14 MED ORDER — LIDOCAINE-EPINEPHRINE 2 %-1:100000 IJ SOLN
INTRAMUSCULAR | Status: DC | PRN
  Administered 2013-07-14: 20 mL

## 2013-07-14 MED ORDER — BUPIVACAINE LIPOSOME 1.3 % IJ SUSP
INTRAMUSCULAR | Status: DC | PRN
  Administered 2013-07-14: 20 mL

## 2013-07-14 MED ORDER — FENTANYL CITRATE 0.05 MG/ML IJ SOLN
INTRAMUSCULAR | Status: DC | PRN
  Administered 2013-07-14: 25 ug via INTRAVENOUS

## 2013-07-14 MED ORDER — SODIUM CHLORIDE 0.9 % IR SOLN
Status: DC | PRN
  Administered 2013-07-14: 08:00:00

## 2013-07-14 MED ORDER — FENTANYL CITRATE 0.05 MG/ML IJ SOLN
25.0000 ug | INTRAMUSCULAR | Status: DC | PRN
  Filled 2013-07-14: qty 1

## 2013-07-14 SURGICAL SUPPLY — 49 items
2.1 DRILL BIT ×1 IMPLANT
BANDAGE CONFORM 3  STR LF (GAUZE/BANDAGES/DRESSINGS) ×2 IMPLANT
BLADE AVERAGE 25X9 (BLADE) ×2 IMPLANT
BLADE SURG 15 STRL LF DISP TIS (BLADE) ×1 IMPLANT
BLADE SURG 15 STRL SS (BLADE) ×2
BNDG CMPR 9X4 STRL LF SNTH (GAUZE/BANDAGES/DRESSINGS) ×1
BNDG COHESIVE 3X5 TAN STRL LF (GAUZE/BANDAGES/DRESSINGS) ×2 IMPLANT
BNDG ESMARK 4X9 LF (GAUZE/BANDAGES/DRESSINGS) ×2 IMPLANT
CLOTH BEACON ORANGE TIMEOUT ST (SAFETY) ×2 IMPLANT
COVER TABLE BACK 60X90 (DRAPES) ×2 IMPLANT
CUFF TOURN SGL QUICK 18 (TOURNIQUET CUFF) ×1 IMPLANT
DRAPE EXTREMITY T 121X128X90 (DRAPE) ×2 IMPLANT
DRAPE LG THREE QUARTER DISP (DRAPES) ×2 IMPLANT
DRAPE OEC MINIVIEW 54X84 (DRAPES) ×2 IMPLANT
DRILL BIT 2.1 (BIT) ×1 IMPLANT
ELECT REM PT RETURN 9FT ADLT (ELECTROSURGICAL) ×2
ELECTRODE REM PT RTRN 9FT ADLT (ELECTROSURGICAL) ×1 IMPLANT
Easyclip ×1 IMPLANT
GAUZE XEROFORM 1X8 LF (GAUZE/BANDAGES/DRESSINGS) ×1 IMPLANT
GLOVE BIO SURGEON STRL SZ 6.5 (GLOVE) ×1 IMPLANT
GLOVE ECLIPSE 6.0 STRL STRAW (GLOVE) ×1 IMPLANT
GLOVE SURG SS PI 8.0 STRL IVOR (GLOVE) ×2 IMPLANT
GOWN PREVENTION PLUS LG XLONG (DISPOSABLE) ×4 IMPLANT
K WIRE ×2 IMPLANT
K-WIRE 1.2X100 (WIRE) ×4
KWIRE 1.2X100 (WIRE) IMPLANT
NDL HYPO 25X1 1.5 SAFETY (NEEDLE) IMPLANT
NEEDLE HYPO 25X1 1.5 SAFETY (NEEDLE) ×2 IMPLANT
NS IRRIG 500ML POUR BTL (IV SOLUTION) ×2 IMPLANT
PACK BASIN DAY SURGERY FS (CUSTOM PROCEDURE TRAY) ×2 IMPLANT
PADDING CAST ABS 4INX4YD NS (CAST SUPPLIES) ×1
PADDING CAST ABS COTTON 4X4 ST (CAST SUPPLIES) ×1 IMPLANT
PENCIL BUTTON HOLSTER BLD 10FT (ELECTRODE) ×2 IMPLANT
SCREW CANN 3.0X14MM (Screw) ×1 IMPLANT
SLEEVE SURGEON STRL (DRAPES) ×2 IMPLANT
SPONGE GAUZE 4X4 12PLY (GAUZE/BANDAGES/DRESSINGS) ×2 IMPLANT
STOCKINETTE 4X48 STRL (DRAPES) ×2 IMPLANT
SUCTION FRAZIER TIP 10 FR DISP (SUCTIONS) IMPLANT
SUT ETHILON 4 0 PS 2 18 (SUTURE) ×2 IMPLANT
SUT VIC AB 3-0 PS2 18 (SUTURE) ×2
SUT VIC AB 3-0 PS2 18XBRD (SUTURE) IMPLANT
SUT VIC AB 4-0 PS2 18 (SUTURE) ×1 IMPLANT
SYR BULB 3OZ (MISCELLANEOUS) ×2 IMPLANT
SYR CONTROL 10ML LL (SYRINGE) ×2 IMPLANT
TOWEL OR 17X24 6PK STRL BLUE (TOWEL DISPOSABLE) ×6 IMPLANT
TRIM IT pin 2 x 100 ×1 IMPLANT
TUBE CONNECTING 12X1/4 (SUCTIONS) IMPLANT
UNDERPAD 30X30 INCONTINENT (UNDERPADS AND DIAPERS) ×2 IMPLANT
WATER STERILE IRR 500ML POUR (IV SOLUTION) ×2 IMPLANT

## 2013-07-14 NOTE — Anesthesia Postprocedure Evaluation (Signed)
  Anesthesia Post-op Note  Patient: Brittany Washington  Procedure(s) Performed: Procedure(s) (LRB): HAMMER TOE CORRECTION LEFT SECOND TOE, AUSTIN/AKIN LEFT (Left)  Patient Location: PACU  Anesthesia Type: MAC combined with regional for post-op pain  Level of Consciousness: awake and alert   Airway and Oxygen Therapy: Patient Spontanous Breathing  Post-op Pain: mild  Post-op Assessment: Post-op Vital signs reviewed, Patient's Cardiovascular Status Stable, Respiratory Function Stable, Patent Airway and No signs of Nausea or vomiting  Last Vitals:  Filed Vitals:   07/14/13 0915  BP: 150/83  Pulse: 74  Temp:   Resp: 15    Post-op Vital Signs: stable   Complications: No apparent anesthesia complications

## 2013-07-14 NOTE — Transfer of Care (Signed)
Immediate Anesthesia Transfer of Care Note  Patient: Brittany Washington  Procedure(s) Performed: Procedure(s) (LRB): HAMMER TOE CORRECTION LEFT SECOND TOE, AUSTIN/AKIN LEFT (Left)  Patient Location: PACU  Anesthesia Type: MAC  Level of Consciousness: awake, alert , oriented and patient cooperative  Airway & Oxygen Therapy: Patient Spontanous Breathing and Patient connected to face mask oxygen  Post-op Assessment: Report given to PACU RN and Post -op Vital signs reviewed and stable  Post vital signs: Reviewed and stable  Complications: No apparent anesthesia complications

## 2013-07-14 NOTE — Anesthesia Procedure Notes (Signed)
Anesthesia Regional Block:  Popliteal block  Pre-Anesthetic Checklist: ,, timeout performed, Correct Patient, Correct Site, Correct Laterality, Correct Procedure, Correct Position, site marked, Risks and benefits discussed,  Surgical consent,  Pre-op evaluation,  At surgeon's request and post-op pain management   Prep: chloraprep       Needles:  Injection technique: Single-shot  Needle Type: Stimiplex     Needle Length:cm 9 cm Needle Gauge: 21 and 21 G    Additional Needles:  Procedures: nerve stimulator Popliteal block  Nerve Stimulator or Paresthesia:  Response: 0.4 mA,   Additional Responses:   Narrative:  Injection made incrementally with aspirations every 5 mL.  Performed by: Personally  Anesthesiologist: Eilene Ghazi MD  Additional Notes: Patient tolerated the procedure well without complications  Popliteal block

## 2013-07-14 NOTE — Anesthesia Preprocedure Evaluation (Signed)
Anesthesia Evaluation  Patient identified by MRN, date of birth, ID band Patient awake    Reviewed: Allergy & Precautions, H&P , NPO status , Patient's Chart, lab work & pertinent test results  Airway Mallampati: II TM Distance: >3 FB Neck ROM: Full    Dental no notable dental hx.    Pulmonary neg pulmonary ROS,  breath sounds clear to auscultation  Pulmonary exam normal       Cardiovascular negative cardio ROS  Rhythm:Regular Rate:Normal     Neuro/Psych negative neurological ROS  negative psych ROS   GI/Hepatic Neg liver ROS, GERD-  Medicated,  Endo/Other  Hypothyroidism   Renal/GU negative Renal ROS  negative genitourinary   Musculoskeletal negative musculoskeletal ROS (+)   Abdominal   Peds negative pediatric ROS (+)  Hematology negative hematology ROS (+)   Anesthesia Other Findings   Reproductive/Obstetrics negative OB ROS                           Anesthesia Physical Anesthesia Plan  ASA: II  Anesthesia Plan: MAC   Post-op Pain Management: MAC Combined w/ Regional for Post-op pain   Induction: Intravenous  Airway Management Planned: Simple Face Mask  Additional Equipment:   Intra-op Plan:   Post-operative Plan:   Informed Consent: I have reviewed the patients History and Physical, chart, labs and discussed the procedure including the risks, benefits and alternatives for the proposed anesthesia with the patient or authorized representative who has indicated his/her understanding and acceptance.   Dental advisory given  Plan Discussed with: CRNA and Surgeon  Anesthesia Plan Comments:         Anesthesia Quick Evaluation

## 2013-07-14 NOTE — Op Note (Addendum)
07/14/2013   9:01 AM  PATIENT:  Brittany Washington  62 y.o. female  PRE-OPERATIVE DIAGNOSIS:  LEFT HALLUX ABDUCTO VALGUS, HAMMER TOE  POST-OPERATIVE DIAGNOSIS:  LEFT HALLUX ABDUCTO VALGUS, HAMMER TOE  PROCEDURE:  Procedure(s): HAMMER TOE CORRECTION LEFT SECOND TOE, AUSTIN/AKIN LEFT (Left)  SURGEON:  Surgeon(s) and Role:    * Sherin Quarry, DPM - Primary  PHYSICIAN ASSISTANT:   ASSISTANTS: none   ANESTHESIA:   regional and IV sedation  EBL:  Total I/O In: 1000 [I.V.:1000] Out: -   BLOOD ADMINISTERED:none  DRAINS: none   LOCAL MEDICATIONS USED:  OTHER Exaprel  SPECIMEN:  No Specimen  DISPOSITION OF SPECIMEN:  N/A  COUNTS:  YES  TOURNIQUET:   Total Tourniquet Time Documented: Calf (Left) - 64 minutes Total: Calf (Left) - 64 minutes   DICTATION: .Reubin Milan Dictation  PLAN OF CARE: Discharge to home after PACU  PATIENT DISPOSITION:  PACU - hemodynamically stable.   Delay start of Pharmacological VTE agent (>24hrs) due to surgical blood loss or risk of bleeding: not applicable  SURGICAL INDICATIONS:  Patient is here for surgical intervention and surgery is further discussed today based on our in-office consultation.  All questions were answered and consent is signed and in the chart outlining risks versus benefits.  The surgical site is marked today and I reviewed the planned procedure(s) with the patient today in preoperative holding area.  He performed a bunionectomy on the left foot and she and her husband were here today to discuss the procedure and consent signed outlining risks versus benefits.  PROCEDURES PERFORMED:  Patient is transported to the operating room and the surgical site is prepped and draped in the usual sterile manner.  Ankle tourniquet is applied to the left ankle have to padding well to avoid contusion and inflated to 250 mm mercury.  Austin/Akin bunionectomy left foot, double osteotomy: Dorsal medial incision is made over the first MTP joint  at the dorsal medial aspect. Deepened down to subcutaneous tissue down to the periosteum were capsulotomy is performed. A McGlamry elevator is used to release the tibial and fibular sesamoids and perform a lateral release. Attention stretch of the first metatarsal head where hypertrophic bone was removed using a sagittal saw preserving the medial groove. Attention stretch to the proximal phalanx where a wedge shaped osteotomy is performed closing wedge to correct the deformity. Pie shaped wedge of bone was removed preserving the lateral hinge. A compression stable size 8 mm is then used to fixate the osteotomy. There was good compression. Attention is directed to the first metatarsal where a V-shaped osteotomy is performed with the apex distal. This allows capital fragment was slide laterally to correct the deformity. It is fixated using a Stryker 3 mm x 14 mm cannulated partially-threaded screw. Used standard AO technique and there was good compression across the osteotomy site. The bone is then cut flush with the capital fragment. Alignment of the first MTP joint was found to be satisfactory and confirmed on Xi-scan. Wound is flushed with sterile antibiotic solution closed capsule with 3-0 Vicryl suture and then closed skin with 4-0 Vicryl suture and 4-0 nylon suture.  Hammertoe arthrodesis left second PIPJ: 2 converging semielliptical incisions are made over the PIPJ. Skin ellipses removed. Medial and lateral edges undermined exposing the PIPJ into the surgical site. Transected to tendon and capsule over the proximal phalangeal head into the surgical site which is then removed using a sagittal saw. Rongeur is used to remove cartilage from the base  of the middle phalanx. We then used the drill bit again with the "trim-it" absorbable pin fixation hardware to drill a canal into the proximal phalanx and middle and distal phalanges. Absorbable pin is then inserted cut appropriately. The toes distracted and pushed  on to the pin and there was good alignment noted on Xi-scan confirmation. Wound is flushed with sterile antibiotic solution and the capsule was repaired using 4-0 Vicryl. The skin is then closed with 4-0 nylon suture.  Wet-to-dry dressing is applied using Xeroform gauze, tourniquet is deflated noting vascular status is intact. Patient is then placed in a Jones compression cast consisting of cast padding, stockinette and Coban. There were no complications.  Patient returns to PACU having tolerated the procedure and anesthesia well.  Patient is given written and oral post op instructions.  Patient will follow up in my office as scheduled for a post op appointment.     Exparel was injected into the surgical site at the first MTP joint and second PIPJ and first and second interspaces, with a total of 20 cc.

## 2013-07-16 ENCOUNTER — Encounter (HOSPITAL_BASED_OUTPATIENT_CLINIC_OR_DEPARTMENT_OTHER): Payer: Self-pay | Admitting: Podiatry

## 2013-07-24 ENCOUNTER — Encounter (HOSPITAL_BASED_OUTPATIENT_CLINIC_OR_DEPARTMENT_OTHER): Payer: Self-pay | Admitting: Podiatry

## 2013-07-25 ENCOUNTER — Other Ambulatory Visit: Payer: Self-pay | Admitting: Cardiology

## 2013-07-30 ENCOUNTER — Other Ambulatory Visit: Payer: 59

## 2013-07-30 ENCOUNTER — Ambulatory Visit: Payer: 59 | Admitting: Cardiology

## 2013-08-14 ENCOUNTER — Other Ambulatory Visit (INDEPENDENT_AMBULATORY_CARE_PROVIDER_SITE_OTHER): Payer: 59

## 2013-08-14 DIAGNOSIS — I119 Hypertensive heart disease without heart failure: Secondary | ICD-10-CM

## 2013-08-14 DIAGNOSIS — E039 Hypothyroidism, unspecified: Secondary | ICD-10-CM

## 2013-08-14 LAB — LIPID PANEL
Cholesterol: 196 mg/dL (ref 0–200)
HDL: 52.1 mg/dL (ref >39.00)
LDL Cholesterol: 122 mg/dL — ABNORMAL HIGH (ref 0–99)
Total CHOL/HDL Ratio: 4
Triglycerides: 108 mg/dL (ref 0.0–149.0)
VLDL: 21.6 mg/dL (ref 0.0–40.0)

## 2013-08-14 LAB — BASIC METABOLIC PANEL
CO2: 26 mEq/L (ref 19–32)
Chloride: 104 mEq/L (ref 96–112)
Creatinine, Ser: 1 mg/dL (ref 0.4–1.2)
Potassium: 3.8 mEq/L (ref 3.5–5.1)
Sodium: 138 mEq/L (ref 135–145)

## 2013-08-14 LAB — HEPATIC FUNCTION PANEL
Albumin: 4.7 g/dL (ref 3.5–5.2)
Alkaline Phosphatase: 66 U/L (ref 39–117)
Total Bilirubin: 0.6 mg/dL (ref 0.3–1.2)
Total Protein: 8.1 g/dL (ref 6.0–8.3)

## 2013-08-14 LAB — T4, FREE: Free T4: 1.01 ng/dL (ref 0.60–1.60)

## 2013-08-14 LAB — TSH: TSH: 0.94 u[IU]/mL (ref 0.35–5.50)

## 2013-08-15 NOTE — Progress Notes (Signed)
Quick Note:  Please make copy of labs for patient visit. ______ 

## 2013-08-18 ENCOUNTER — Ambulatory Visit (INDEPENDENT_AMBULATORY_CARE_PROVIDER_SITE_OTHER): Payer: 59 | Admitting: Cardiology

## 2013-08-18 ENCOUNTER — Encounter: Payer: Self-pay | Admitting: Cardiology

## 2013-08-18 VITALS — BP 140/78 | HR 68 | Ht 64.0 in | Wt 167.0 lb

## 2013-08-18 DIAGNOSIS — I119 Hypertensive heart disease without heart failure: Secondary | ICD-10-CM

## 2013-08-18 DIAGNOSIS — R7989 Other specified abnormal findings of blood chemistry: Secondary | ICD-10-CM

## 2013-08-18 DIAGNOSIS — E039 Hypothyroidism, unspecified: Secondary | ICD-10-CM

## 2013-08-18 DIAGNOSIS — R011 Cardiac murmur, unspecified: Secondary | ICD-10-CM

## 2013-08-18 NOTE — Progress Notes (Signed)
Val Eagle Date of Birth:  1951/01/12 [address] [place], [place]  [address] [phone]  Fax   [phone]  HPI: This pleasant 62 year old Caucasian female is seen for a scheduled 6 month followup office visit.  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  She also has a history of known heart murmur and a past history of abnormal liver function studies.  She has a history of mild dyslipidemia but is not on statins because of her abnormal liver function studies.  In the past her transaminases have been elevated significantly and have improved after she cut back on wine and on nonsteroidal anti-inflammatories such as ibuprofen.  5 weeks ago she had surgery for a bunion and hammertoe of the left foot.  She is still out of work and anticipates returning to work in another month on 09/15/13.  As a result of inactivity from postoperative state, the patient's weight is up 3 pounds. Current Outpatient Prescriptions  Medication Sig Dispense Refill  . aspirin 81 MG tablet Take 81 mg by mouth 2 (two) times a week.       . cetirizine (ZYRTEC) 10 MG tablet Take 10 mg by mouth daily.      . clonazePAM (KLONOPIN) 1 MG tablet TAKE 1 TO 2 TABLETS BY MOUTH AT BEDTIME AS NEEDED  60 tablet  5  . hydrochlorothiazide (MICROZIDE) 12.5 MG capsule TAKE 1 CAPSULE BY MOUTH DAILY--  TAKES IN AM  30 capsule  6  . ibuprofen (MOTRIN IB) 200 MG tablet Take 200 mg by mouth every 6 (six) hours as needed. If needed for wrist pain       . levothyroxine (SYNTHROID, LEVOTHROID) 112 MCG tablet Take 112 mcg by mouth every evening.      Marland Kitchen levothyroxine (SYNTHROID, LEVOTHROID) 112 MCG tablet TAKE 1 TABLET BY MOUTH ONCE DAILY  90 tablet  0  . pantoprazole (PROTONIX) 40 MG tablet Take 40 mg by mouth as needed.      . raloxifene (EVISTA) 60 MG tablet Take 60 mg by mouth daily.       No current facility-administered medications for this visit.     Allergies  Allergen Reactions  . Sulfa Antibiotics     Patient Active Problem List   Diagnosis Date Noted  . Other abnormal blood chemistry 06/13/2011  . Benign hypertensive heart disease without heart failure 06/13/2011  . Hypothyroid 06/13/2011  . Heart murmur 06/13/2011    History  Smoking status  . Never Smoker   Smokeless tobacco  . Never Used    History  Alcohol Use  . 4.2 oz/week  . 7 Glasses of wine per week    Comment: 1 WINE DAILY    Family History  Problem Relation Age of Onset  . Heart attack Father   . Heart attack Brother     Review of Systems: The patient denies any heat or cold intolerance.  No weight gain or weight loss.  The patient denies headaches or blurry vision.  There is no cough or sputum production.  The patient denies dizziness.  There is no hematuria or hematochezia.  The patient denies any muscle aches or arthritis.  The patient denies any rash.  The patient denies frequent falling or instability.  There is no history of depression or anxiety.  All other systems were reviewed and are negative.   Physical Exam: Filed Vitals:   08/18/13 1534  BP: 140/78  Pulse:  the general appearance reveals a well-developed well-nourished woman in no distress.The head and neck exam reveals pupils equal and reactive.  Extraocular movements are full.  There is no scleral icterus.  The mouth and pharynx are normal.  The neck is supple.  The carotids reveal no bruits.  The jugular venous pressure is normal.  The  thyroid is not enlarged.  There is no lymphadenopathy.  The chest is clear to percussion and auscultation.  There are no rales or rhonchi.  Expansion of the chest is symmetrical.  The precordium is quiet.  The first heart sound is normal.  The second heart sound is physiologically split.  There is no  gallop rub or click.  There is a soft grade 1/6 systolic ejection murmur at the left sternal edge.  There is no abnormal lift or heave.  The abdomen is  soft and nontender.  The bowel sounds are normal.  The liver and spleen are not enlarged.  There are no abdominal masses.  There are no abdominal bruits.  Extremities reveal good pedal pulses.  There is no phlebitis or edema.  There is no cyanosis or clubbing.  Strength is normal and symmetrical in all extremities.  There is no lateralizing weakness.  There are no sensory deficits.  The skin is warm and dry.  There is no rash.      Assessment / Plan: Continue same medication.  Resume regular activity schedule as per recovery from foot surgery will allow.  She will be returning to work on 09/15/2013.  Continue same medications.  Try to lose weight.  Recheck in 6 months for followup office visit lipid panel hepatic function panel basal metabolic panel TSH and free T4

## 2013-08-18 NOTE — Patient Instructions (Signed)
Your physician wants you to follow-up in: 6 months with fasting lab work. You will receive a reminder letter in the mail two months in advance. If you don't receive a letter, please call our office to schedule the follow-up appointment.    Continue same medications

## 2013-08-18 NOTE — Assessment & Plan Note (Signed)
The patient has a history of chronically elevated LFTs.  The LFT elevation has improved since last visit you know she has had to take some nonsteroidal anti-inflammatories recently.

## 2013-08-18 NOTE — Assessment & Plan Note (Signed)
Blood pressure is upper normal on current therapy and should improve with weight loss and additional regular aerobic exercise.  Continue same medication.

## 2013-08-18 NOTE — Assessment & Plan Note (Signed)
The patient has a known systolic murmur across her aortic valve.  There are no echo results and affect.  She has a history of a remote echocardiogram which did not show any significant aortic stenosis.

## 2013-08-18 NOTE — Assessment & Plan Note (Signed)
The patient has hypothyroidism and is on Synthroid.  She is clinically euthyroid now we checked her labs and her TSH and free T4 are within normal limits.

## 2013-08-25 ENCOUNTER — Other Ambulatory Visit: Payer: Self-pay | Admitting: Cardiology

## 2013-08-25 NOTE — Telephone Encounter (Signed)
Patient call for klonipin refill

## 2013-09-29 ENCOUNTER — Other Ambulatory Visit: Payer: Self-pay | Admitting: Cardiology

## 2013-10-01 ENCOUNTER — Other Ambulatory Visit: Payer: Self-pay | Admitting: Cardiology

## 2013-10-01 DIAGNOSIS — J40 Bronchitis, not specified as acute or chronic: Secondary | ICD-10-CM

## 2013-10-22 ENCOUNTER — Other Ambulatory Visit: Payer: Self-pay | Admitting: Cardiology

## 2013-12-29 ENCOUNTER — Telehealth: Payer: Self-pay | Admitting: *Deleted

## 2013-12-29 DIAGNOSIS — R058 Other specified cough: Secondary | ICD-10-CM

## 2013-12-29 DIAGNOSIS — J029 Acute pharyngitis, unspecified: Secondary | ICD-10-CM

## 2013-12-29 DIAGNOSIS — R05 Cough: Secondary | ICD-10-CM

## 2013-12-29 DIAGNOSIS — J40 Bronchitis, not specified as acute or chronic: Secondary | ICD-10-CM

## 2013-12-29 MED ORDER — AZITHROMYCIN 250 MG PO TABS: ORAL_TABLET | ORAL | Status: DC

## 2013-12-29 MED ORDER — HYDROCOD POLST-CHLORPHEN POLST 10-8 MG/5ML PO LQCR: 5.0000 mL | Freq: Two times a day (BID) | ORAL | Status: DC | PRN

## 2013-12-29 NOTE — Telephone Encounter (Signed)
Okay to call in a Z-Pak and Tussionex

## 2013-12-29 NOTE — Telephone Encounter (Signed)
Advised husband. He will pick up Rx for Tussionex since this can longer be called to pharmacy

## 2013-12-29 NOTE — Telephone Encounter (Signed)
Patient has had productive cough for over a week, sore throat, and feels bad. Would like Rx for Zpak and Tussionex to use at bedtime. Will forward to  Dr. Mare Ferrari for review

## 2014-01-08 ENCOUNTER — Other Ambulatory Visit: Payer: Self-pay | Admitting: Cardiology

## 2014-01-12 ENCOUNTER — Other Ambulatory Visit: Payer: Self-pay | Admitting: Cardiology

## 2014-02-02 ENCOUNTER — Other Ambulatory Visit: Payer: Self-pay | Admitting: Cardiology

## 2014-02-16 ENCOUNTER — Other Ambulatory Visit (HOSPITAL_COMMUNITY): Payer: Self-pay | Admitting: Obstetrics and Gynecology

## 2014-02-16 DIAGNOSIS — Z1231 Encounter for screening mammogram for malignant neoplasm of breast: Secondary | ICD-10-CM

## 2014-02-24 ENCOUNTER — Other Ambulatory Visit: Payer: Self-pay | Admitting: Cardiology

## 2014-02-26 ENCOUNTER — Other Ambulatory Visit: Payer: Self-pay | Admitting: *Deleted

## 2014-02-26 MED ORDER — CLONAZEPAM 1 MG PO TABS: ORAL_TABLET | ORAL | Status: DC

## 2014-03-03 ENCOUNTER — Ambulatory Visit (HOSPITAL_COMMUNITY): Payer: 59 | Attending: Obstetrics and Gynecology

## 2014-03-11 ENCOUNTER — Other Ambulatory Visit: Payer: Self-pay

## 2014-03-11 ENCOUNTER — Encounter: Payer: Self-pay | Admitting: Cardiology

## 2014-03-11 ENCOUNTER — Ambulatory Visit (INDEPENDENT_AMBULATORY_CARE_PROVIDER_SITE_OTHER): Payer: 59 | Admitting: Cardiology

## 2014-03-11 ENCOUNTER — Other Ambulatory Visit: Payer: 59

## 2014-03-11 VITALS — BP 132/84 | HR 68 | Ht 64.0 in | Wt 135.0 lb

## 2014-03-11 DIAGNOSIS — R7989 Other specified abnormal findings of blood chemistry: Secondary | ICD-10-CM

## 2014-03-11 DIAGNOSIS — Z889 Allergy status to unspecified drugs, medicaments and biological substances status: Secondary | ICD-10-CM

## 2014-03-11 DIAGNOSIS — R05 Cough: Secondary | ICD-10-CM

## 2014-03-11 DIAGNOSIS — R059 Cough, unspecified: Secondary | ICD-10-CM

## 2014-03-11 DIAGNOSIS — E039 Hypothyroidism, unspecified: Secondary | ICD-10-CM

## 2014-03-11 DIAGNOSIS — J309 Allergic rhinitis, unspecified: Secondary | ICD-10-CM

## 2014-03-11 DIAGNOSIS — R011 Cardiac murmur, unspecified: Secondary | ICD-10-CM

## 2014-03-11 DIAGNOSIS — I119 Hypertensive heart disease without heart failure: Secondary | ICD-10-CM

## 2014-03-11 DIAGNOSIS — Z9109 Other allergy status, other than to drugs and biological substances: Secondary | ICD-10-CM

## 2014-03-11 DIAGNOSIS — E038 Other specified hypothyroidism: Secondary | ICD-10-CM

## 2014-03-11 LAB — LIPID PANEL
Cholesterol: 195 mg/dL (ref 0–200)
HDL: 74 mg/dL (ref >39.00)
LDL Cholesterol: 100 mg/dL — ABNORMAL HIGH (ref 0–99)
NONHDL: 121
Total CHOL/HDL Ratio: 3
Triglycerides: 104 mg/dL (ref 0.0–149.0)
VLDL: 20.8 mg/dL (ref 0.0–40.0)

## 2014-03-11 LAB — T4, FREE: Free T4: 1.1 ng/dL (ref 0.60–1.60)

## 2014-03-11 LAB — HEPATIC FUNCTION PANEL
ALT: 30 U/L (ref 0–35)
AST: 34 U/L (ref 0–37)
Albumin: 4.6 g/dL (ref 3.5–5.2)
Alkaline Phosphatase: 63 U/L (ref 39–117)
BILIRUBIN DIRECT: 0.1 mg/dL (ref 0.0–0.3)
Total Bilirubin: 0.9 mg/dL (ref 0.2–1.2)
Total Protein: 7.9 g/dL (ref 6.0–8.3)

## 2014-03-11 LAB — BASIC METABOLIC PANEL
BUN: 15 mg/dL (ref 6–23)
CHLORIDE: 101 meq/L (ref 96–112)
CO2: 25 mEq/L (ref 19–32)
Calcium: 9.4 mg/dL (ref 8.4–10.5)
Creatinine, Ser: 0.9 mg/dL (ref 0.4–1.2)
GFR: 69.31 mL/min (ref >60.00)
Glucose, Bld: 92 mg/dL (ref 70–99)
Potassium: 4.1 mEq/L (ref 3.5–5.1)
Sodium: 136 mEq/L (ref 135–145)

## 2014-03-11 LAB — TSH: TSH: 0.07 u[IU]/mL — ABNORMAL LOW (ref 0.35–4.50)

## 2014-03-11 MED ORDER — IBUPROFEN 600 MG PO TABS: 600.0000 mg | ORAL_TABLET | Freq: Two times a day (BID) | ORAL | Status: DC | PRN

## 2014-03-11 MED ORDER — IBUPROFEN 200 MG PO TABS: 200.0000 mg | ORAL_TABLET | Freq: Four times a day (QID) | ORAL | Status: DC | PRN

## 2014-03-11 NOTE — Telephone Encounter (Signed)
Patient requesting Ibuprofen 600 mg 1-2 a day as needed for wrist pain. She only takes occasionally and would like a refill. Will forward to  Dr. Mare Ferrari for review

## 2014-03-11 NOTE — Progress Notes (Signed)
Brittany Washington Date of Birth:  1951-03-29 [facility] [address] [place], [place]  [address] [phone]  Fax   [phone]  HPI: This pleasant 63 year old Caucasian female is seen for a scheduled 6 month followup office visit. 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 She also has a history of known heart murmur and a past history of abnormal liver function studies. She has a history of mild dyslipidemia but is not on statins because of her abnormal liver function studies. In the past her transaminases have been elevated significantly and have improved after she cut back on wine and on nonsteroidal anti-inflammatories such as ibuprofen.  Since last visit she had her husband have gone on a heart healthy prudent diet.  She has lost about 32 pounds and her husband has lost a similar amount.  He also go to the [facility] on a regular basis.  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  Since last visit she has had a worse than usual time with her seasonal allergies and is requesting referral to an allergist specialist.  She has a lot of cough and congestion.  Current Outpatient Prescriptions  Medication Sig Dispense Refill  . aspirin 81 MG tablet Take 81 mg by mouth 2 (two) times a week.       Marland Kitchen azithromycin (ZITHROMAX) 250 MG tablet TAKE 2 TABLETS TODAY AND THEN 1 TABLET DAILY UNTIL FINISHED  6 tablet  0  . cetirizine (ZYRTEC) 10 MG tablet Take 10 mg by mouth daily.      . chlorpheniramine-HYDROcodone (TUSSIONEX PENNKINETIC ER) 10-8 MG/5ML LQCR Take 5 mLs by mouth every 12 (twelve) hours as needed for cough.  120 mL  0  . clonazePAM (KLONOPIN) 1 MG tablet Take 1- 2 tablets at bedtime as needed  60 tablet  3  . hydrochlorothiazide (MICROZIDE) 12.5 MG capsule TAKE 1 CAPSULE BY MOUTH EVERY OTHER DAY      . levothyroxine (SYNTHROID, LEVOTHROID) 112 MCG tablet  Take 112 mcg by mouth every evening.      Marland Kitchen levothyroxine (SYNTHROID, LEVOTHROID) 112 MCG tablet TAKE 1 TABLET BY MOUTH ONCE DAILY  90 tablet  1  . pantoprazole (PROTONIX) 40 MG tablet TAKE 1 TABLET BY MOUTH ONCE DAILY  90 tablet  0  . raloxifene (EVISTA) 60 MG tablet Take 60 mg by mouth daily.      Marland Kitchen ibuprofen (MOTRIN IB) 600 MG tablet Take 1 tablet (600 mg total) by mouth 2 (two) times daily as needed.  90 tablet  1   No current facility-administered medications for this visit.    Allergies  Allergen Reactions  . Sulfa Antibiotics     Patient Active Problem List   Diagnosis Date Noted  . Other abnormal blood chemistry 06/13/2011  . Benign hypertensive heart disease without heart failure 06/13/2011  . Hypothyroid 06/13/2011  . Heart murmur 06/13/2011    History  Smoking status  . Never Smoker   Smokeless tobacco  . Never Used    History  Alcohol Use  . 4.2 oz/week  . 7 Glasses of wine per week    Comment: 1 WINE DAILY    Family History  Problem Relation Age of Onset  . Heart attack Father   . Heart attack Brother     Review of Systems: The patient denies any heat or cold intolerance.  No  weight gain or weight loss.  The patient denies headaches or blurry vision.  There is no cough or sputum production.  The patient denies dizziness.  There is no hematuria or hematochezia.  The patient denies any muscle aches or arthritis.  The patient denies any rash.  The patient denies frequent falling or instability.  There is no history of depression or anxiety.  All other systems were reviewed and are negative.   Physical Exam: Filed Vitals:   03/11/14 0908  BP: 132/84  Pulse: 68   the general appearance reveals a well-developed well-nourished woman in no distress.The head and neck exam reveals pupils equal and reactive.  Extraocular movements are full.  There is no scleral icterus.  The mouth and pharynx are normal.  The neck is supple.  The carotids reveal no bruits.  The  jugular venous pressure is normal.  The  thyroid is not enlarged.  There is no lymphadenopathy.  The chest is clear to percussion and auscultation.  There are no rales or rhonchi.  Expansion of the chest is symmetrical.  The precordium is quiet.  The first heart sound is normal.  The second heart sound is physiologically split.  There is a soft systolic ejection murmur at the base.  There is no abnormal lift or heave.  The abdomen is soft and nontender.  The bowel sounds are normal.  The liver and spleen are not enlarged.  There are no abdominal masses.  There are no abdominal bruits.  Extremities reveal good pedal pulses.  There is no phlebitis or edema.  There is no cyanosis or clubbing.  Strength is normal and symmetrical in all extremities.  There is no lateralizing weakness.  There are no sensory deficits.  The skin is warm and dry.  There is no rash.      Assessment / Plan: 1. benign hypertensive heart disease without heart failure. 2. Hypercholesterolemia 3. heart murmur 4. severe seasonal allergies 5. past history of abnormal liver function studies  Plan: We are checking labs today We will cut back on her HCTZ to just every other day now that her blood pressure is doing better with weight loss We are referring her for allergy evaluation to Dr. Baird Lyons

## 2014-03-11 NOTE — Assessment & Plan Note (Signed)
Patient has a history of hypothyroidism and is on thyroid replacement.  We are checking thyroid functions today.

## 2014-03-11 NOTE — Assessment & Plan Note (Signed)
She has had a terrible time this year with her seasonal allergies.  We will refer her to Dr. Baird Lyons.

## 2014-03-11 NOTE — Telephone Encounter (Signed)
Discussed with  Dr. Brackbill and ok to fill  

## 2014-03-11 NOTE — Assessment & Plan Note (Signed)
Her blood pressure has improved with her significant weight loss and her regular exercise.  She is not having any chest pain or palpitations

## 2014-03-11 NOTE — Patient Instructions (Addendum)
Will obtain labs today and call you with the results (lp/bmet/hfp/tsh/ft4)  DECREASE YOUR HCTZ TO EVERY OTHER DAY  Your physician wants you to follow-up in: 6 months with fasting labs (lp/bmet/hfp/tsh/ft4) You will receive a reminder letter in the mail two months in advance. If you don't receive a letter, please call our office to schedule the follow-up appointment.   Appointment with Dr Annamaria Boots August 21 at 9:30 am, [phone]

## 2014-03-11 NOTE — Addendum Note (Signed)
Addended by: Alvina Filbert B on: 03/11/2014 06:31 PM   Modules accepted: Orders

## 2014-03-12 NOTE — Progress Notes (Signed)
Quick Note:  Please report to patient. The recent labs are stable. Continue same medication and careful diet. Thyroid replacement dose is satisfactory. Her lipids and blood sugar are better. Her liver function tests have returned to normal with weight loss. Excellent report! ______

## 2014-04-02 ENCOUNTER — Ambulatory Visit (HOSPITAL_COMMUNITY)
Admission: RE | Admit: 2014-04-02 | Discharge: 2014-04-02 | Disposition: A | Discharge status: Home / Self Care | Payer: 59 | Source: Ambulatory Visit | Attending: Obstetrics and Gynecology | Admitting: Obstetrics and Gynecology

## 2014-04-02 ENCOUNTER — Other Ambulatory Visit (HOSPITAL_COMMUNITY): Payer: Self-pay | Admitting: Obstetrics and Gynecology

## 2014-04-02 DIAGNOSIS — Z1231 Encounter for screening mammogram for malignant neoplasm of breast: Secondary | ICD-10-CM

## 2014-04-06 ENCOUNTER — Telehealth: Payer: Self-pay | Admitting: Internal Medicine

## 2014-04-06 NOTE — Telephone Encounter (Signed)
Pt calling and would like to schedule her colon at [facility]. 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 When would you like to schedule her? Please advise.

## 2014-04-06 NOTE — Telephone Encounter (Signed)
Report placed on Dr. Blanch Media desk.

## 2014-04-06 NOTE — Telephone Encounter (Signed)
Please pull her previous endoscopy reports from my review. Thanks

## 2014-04-07 ENCOUNTER — Encounter: Payer: Self-pay | Admitting: Internal Medicine

## 2014-04-07 NOTE — Telephone Encounter (Signed)
Pt called back and states she would like to have her colon in the [facility] now. She found out we have propofol now and is ok having it here, Pt already scheduled recall with front desk. Dr. Henrene Pastor notified.

## 2014-04-07 NOTE — Telephone Encounter (Signed)
Noted  

## 2014-04-21 ENCOUNTER — Other Ambulatory Visit: Payer: Self-pay | Admitting: Cardiology

## 2014-04-24 ENCOUNTER — Institutional Professional Consult (permissible substitution): Payer: 59 | Admitting: Internal Medicine

## 2014-04-27 ENCOUNTER — Other Ambulatory Visit: Payer: Self-pay | Admitting: Cardiology

## 2014-04-30 ENCOUNTER — Institutional Professional Consult (permissible substitution): Payer: 59 | Admitting: Internal Medicine

## 2014-05-15 ENCOUNTER — Institutional Professional Consult (permissible substitution): Payer: 59 | Admitting: Internal Medicine

## 2014-05-28 ENCOUNTER — Ambulatory Visit (AMBULATORY_SURGERY_CENTER): Payer: Self-pay | Admitting: *Deleted

## 2014-05-28 VITALS — Ht 64.0 in | Wt 137.0 lb

## 2014-05-28 DIAGNOSIS — Z800 Family history of malignant neoplasm of digestive organs: Secondary | ICD-10-CM

## 2014-05-28 MED ORDER — MOVIPREP 100 G PO SOLR: 1.0000 | Freq: Once | ORAL | Status: DC

## 2014-05-28 NOTE — Progress Notes (Signed)
No egg or soy allergy. ewm No home 02 use. ewm No diet pills. ewm Previous colon with perry 2007. ewm Pt declined emmi video. ewm

## 2014-06-11 ENCOUNTER — Encounter: Payer: Self-pay | Admitting: Internal Medicine

## 2014-06-11 ENCOUNTER — Ambulatory Visit (AMBULATORY_SURGERY_CENTER): Payer: 59 | Admitting: Internal Medicine

## 2014-06-11 VITALS — BP 145/89 | HR 57 | Temp 97.1°F | Resp 16 | Ht 64.0 in | Wt 137.0 lb

## 2014-06-11 DIAGNOSIS — Z800 Family history of malignant neoplasm of digestive organs: Secondary | ICD-10-CM

## 2014-06-11 DIAGNOSIS — Z1211 Encounter for screening for malignant neoplasm of colon: Secondary | ICD-10-CM

## 2014-06-11 MED ORDER — SODIUM CHLORIDE 0.9 % IV SOLN: 500.0000 mL | INTRAVENOUS | Status: DC

## 2014-06-11 NOTE — Patient Instructions (Signed)
YOU HAD AN ENDOSCOPIC PROCEDURE TODAY AT THE [facility]: Refer to the procedure report that was given to you for any specific questions about what was found during the examination.  If the procedure report does not answer your questions, please call your gastroenterologist to clarify.  If you requested that your care partner not be given the details of your procedure findings, then the procedure report has been included in a sealed envelope for you to review at your convenience later.  YOU SHOULD EXPECT: Some feelings of bloating in the abdomen. Passage of more gas than usual.  Walking can help get rid of the air that was put into your GI tract during the procedure and reduce the bloating. If you had a lower endoscopy (such as a colonoscopy or flexible sigmoidoscopy) you may notice spotting of blood in your stool or on the toilet paper. If you underwent a bowel prep for your procedure, then you may not have a normal bowel movement for a few days.  DIET: Your first meal following the procedure should be a light meal and then it is ok to progress to your normal diet.  A half-sandwich or bowl of soup is an example of a good first meal.  Heavy or fried foods are harder to digest and may make you feel nauseous or bloated.  Likewise meals heavy in dairy and vegetables can cause extra gas to form and this can also increase the bloating.  Drink plenty of fluids but you should avoid alcoholic beverages for 24 hours.  ACTIVITY: Your care partner should take you home directly after the procedure.  You should plan to take it easy, moving slowly for the rest of the day.  You can resume normal activity the day after the procedure however you should NOT DRIVE or use heavy machinery for 24 hours (because of the sedation medicines used during the test).    SYMPTOMS TO REPORT IMMEDIATELY: A gastroenterologist can be reached at any hour.  During normal business hours, 8:30 AM to 5:00 PM Monday through Friday,  call [phone].  After hours and on weekends, please call the GI answering service at [phone] who will take a message and have the physician on call contact you.   Following lower endoscopy (colonoscopy or flexible sigmoidoscopy):  Excessive amounts of blood in the stool  Significant tenderness or worsening of abdominal pains  Swelling of the abdomen that is new, acute  Fever of 100F or higher  FOLLOW UP: If any biopsies were taken you will be contacted by phone or by letter within the next 1-3 weeks.  Call your gastroenterologist if you have not heard about the biopsies in 3 weeks.  Our staff will call the home number listed on your records the next business day following your procedure to check on you and address any questions or concerns that you may have at that time regarding the information given to you following your procedure. This is a courtesy call and so if there is no answer at the home number and we have not heard from you through the emergency physician on call, we will assume that you have returned to your regular daily activities without incident.  SIGNATURES/CONFIDENTIALITY: You and/or your care partner have signed paperwork which will be entered into your electronic medical record.  These signatures attest to the fact that that the information above on your After Visit Summary has been reviewed and is understood.  Full responsibility of the confidentiality of this   discharge information lies with you and/or your care-partner.  Resume medications. 

## 2014-06-11 NOTE — Op Note (Signed)
[facility]  [address] [place], [address]   COLONOSCOPY PROCEDURE REPORT  PATIENT: Brittany Washington, Brittany Washington  MR#: [id number] BIRTHDATE: 04-17-1951 , 62  yrs. old GENDER: female ENDOSCOPIST: Eustace Quail, MD REFERRED IR:[id number] Recall, PROCEDURE DATE:  06/11/2014 PROCEDURE:   Colonoscopy, screening First Screening Colonoscopy - Avg.  risk and is 50 yrs.  old or older - No.  Prior Negative Screening - Now for repeat screening. N/A  History of Adenoma - Now for follow-up colonoscopy & has been > or = to 3 yrs.  N/A  Polyps Removed Today? No.  Recommend repeat exam, <10 yrs? No. ASA CLASS:   Class II INDICATIONS:patient's immediate family history of colon cancer. Father 61. Negative prior exams 2001 and 2007 MEDICATIONS: Monitored anesthesia care and Propofol 400 mg IV  DESCRIPTION OF PROCEDURE:   After the risks benefits and alternatives of the procedure were thoroughly explained, informed consent was obtained.  The digital rectal exam revealed no abnormalities of the rectum.   The LB [id number]  endoscope was introduced through the anus and advanced to the cecum, which was identified by both the appendix and ileocecal valve. No adverse events experienced.   The quality of the prep was good, using MoviPrep  The instrument was then slowly withdrawn as the colon was fully examined.      COLON FINDINGS: A normal appearing cecum, ileocecal valve, and appendiceal orifice were identified.  The ascending, transverse, descending, sigmoid colon, and rectum appeared unremarkable. Retroflexed views revealed no abnormalities. The time to cecum=3 minutes 30 seconds.  Withdrawal time=13 minutes 49 seconds.  The scope was withdrawn and the procedure completed.  COMPLICATIONS: There were no immediate complications.  ENDOSCOPIC IMPRESSION: 1. Normal colonoscopy  RECOMMENDATIONS: 1. Continue current colorectal screening recommendations with a repeat colonoscopy  in 10 years.  eSigned:  Eustace Quail, MD 06/11/2014 9:24 AM   cc: The Patient and Darlin Coco, MD

## 2014-06-11 NOTE — Progress Notes (Signed)
A/ox3 pleased with MAC, report to Sheila RN 

## 2014-06-12 ENCOUNTER — Telehealth: Payer: Self-pay

## 2014-06-12 NOTE — Telephone Encounter (Signed)
  Follow up Call-  Call back number 06/11/2014  Post procedure Call Back phone  # [phone]  Permission to leave phone message Yes     Patient questions:  Do you have a fever, pain , or abdominal swelling? No. Pain Score  0 *  Have you tolerated food without any problems? Yes.    Have you been able to return to your normal activities? Yes.    Do you have any questions about your discharge instructions: Diet   No. Medications  No. Follow up visit  No.  Do you have questions or concerns about your Care? No.  Actions: * If pain score is 4 or above: No action needed, pain <4.

## 2014-06-17 ENCOUNTER — Other Ambulatory Visit: Payer: Self-pay | Admitting: Cardiology

## 2014-06-17 DIAGNOSIS — G4700 Insomnia, unspecified: Secondary | ICD-10-CM

## 2014-06-25 ENCOUNTER — Encounter: Payer: Self-pay | Admitting: Internal Medicine

## 2014-06-25 ENCOUNTER — Ambulatory Visit (INDEPENDENT_AMBULATORY_CARE_PROVIDER_SITE_OTHER): Payer: 59 | Admitting: Internal Medicine

## 2014-06-25 VITALS — BP 122/78 | HR 69 | Ht 64.0 in | Wt 140.6 lb

## 2014-06-25 DIAGNOSIS — R05 Cough: Secondary | ICD-10-CM

## 2014-06-25 DIAGNOSIS — R059 Cough, unspecified: Secondary | ICD-10-CM

## 2014-06-25 NOTE — Patient Instructions (Signed)
Office spirometry     Dx cough  Sample x 2 Breo Ellipta     1 puff then rinse mouth, twice daily  Try this for a couple of weeks to see if it affects your cough.

## 2014-06-25 NOTE — Progress Notes (Signed)
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 Mare Ferrari; Allergies, worse in the Spring but have started getting bad in the fall now causing cough.  Husband Dr Coletta Memos here She has a long history as an adult of dry cough associated with environmental exposures, worse in spring and fall. If symptoms have been getting more frequent in the last year but otherwise pattern is similar. She describes spending a short time outdoors, especially spring and fall, then rapid onset of dry cough without wheeze. Little nasal discharge or stuffiness. No associated chest pain, palpitation, fever or aching. To block this she has been taking Zyrtec every day for the past 5 years, but with little apparent effect especially in the last year or so. Little recognize reflux. No history of allergic-type sensitivity to foods, insect stings, or irritants/strong odors. She does not recognize importance of specific environments other than "trees and weeds". It helps to stay indoors. Office Spirometry 06/25/14- WNL with some cough during exhalation noted. FEV1 2.12/87%, FEV1/FVC 0.77, FEF 25-75% 2.01/84%.  Prior to Admission medications   Medication Sig Start Date End Date Taking? Authorizing Provider  aspirin 81 MG tablet Take 81 mg by mouth 2 (two) times a week.    Yes Historical Provider, MD  cetirizine (ZYRTEC) 10 MG tablet Take 10 mg by mouth daily.   Yes Historical Provider, MD  clonazePAM (KLONOPIN) 1 MG tablet TAKE 1 TO 2 TABLETS BY MOUTH EACH NIGHT AT BEDTIME AS NEEDED 06/19/14  Yes Darlin Coco, MD  hydrochlorothiazide (MICROZIDE) 12.5 MG capsule TAKE 1 CAPSULE BY MOUTH EVERY OTHER DAY   Yes Darlin Coco, MD  ibuprofen (MOTRIN IB) 600 MG tablet Take 1 tablet (600 mg total) by mouth 2 (two) times daily as needed. 03/11/14  Yes Darlin Coco, MD  levothyroxine (SYNTHROID, LEVOTHROID) 112 MCG tablet TAKE 1 TABLET BY MOUTH ONCE DAILY 04/21/14  Yes Darlin Coco, MD  pantoprazole (PROTONIX) 40 MG tablet  TAKE 1 TABLET BY MOUTH ONCE as needed 04/28/14  Yes Darlin Coco, MD  raloxifene (EVISTA) 60 MG tablet Take 60 mg by mouth daily.   Yes Historical Provider, MD   Past Medical History  Diagnosis Date  . GERD (gastroesophageal reflux disease)   . Hypothyroidism   . Arthritis   . Benign hypertensive heart disease without heart failure   . Dyslipidemia     MILD  . Heart murmur, systolic     MECHNICAL  . Hypertension     mild  . Osteoporosis    Past Surgical History  Procedure Laterality Date  . Trigger finger release  01/09/2012    Procedure: RELEASE TRIGGER FINGER/A-1 PULLEY;  Surgeon: [name], MD;  Location: [facility];  Service: Orthopedics;  Laterality: Left;  . Lumbar diskectomy w/ dissection, left l4 -- l5  02-23-2004  . Re-do lumbar diskectomy , l4  - l5  05-03-2004  . Posterior lumbar fusion  05-16-2005    L4 -- L5  . Orif left distal radial fx w/ bone graft  01-14-2009  . Laparoscopic cholecystectomy  1995  . Vaginal hysterectomy  1993    W/ BILATERAL SALPINGOOPHORECTOMY  . Hammer toe surgery Left 07/14/2013    Procedure: HAMMER TOE CORRECTION LEFT SECOND TOE, AUSTIN/AKIN LEFT;  Surgeon: [name], DPM;  Location: [facility];  Service: Podiatry;  Laterality: Left;  . Colonoscopy    . Wrist fracture surgery      left   Family History  Problem Relation Age of  Onset  . Heart attack Father   . Colon cancer Father   . Heart attack Brother   . Ovarian cancer Mother   . Rectal cancer Neg Hx   . Stomach cancer Neg Hx   . Breast cancer Neg Hx    History   Social History  . Marital Status: Married    Spouse Name: N/A    Number of Children: N/A  . Years of Education: N/A   Occupational History  . Not on file.   Social History Main Topics  . Smoking status: Never Smoker   . Smokeless tobacco: Never Used  . Alcohol Use: 4.2 oz/week    7 Glasses of wine per week     Comment: 1 WINE DAILY  . Drug Use: No  . Sexual  Activity: Not on file   Other Topics Concern  . Not on file   Social History Narrative  . No narrative on file   ROS-see HPI Constitutional:   No-   weight loss, night sweats, fevers, chills, fatigue, lassitude. HEENT:   No-  headaches, difficulty swallowing, tooth/dental problems, +sore throat,      +sneezing,+ itching, ear ache, +nasal congestion, post nasal drip,  CV:  No-   chest pain, orthopnea, PND, swelling in lower extremities, anasarca,                                  dizziness, palpitations Resp: No-   shortness of breath with exertion or at rest.              No-   productive cough,  + non-productive cough,  No- coughing up of blood.              No-   change in color of mucus.  No- wheezing.   Skin: No-   rash or lesions. GI:  No-   heartburn, indigestion, abdominal pain, nausea, vomiting, diarrhea,                 change in bowel habits, loss of appetite GU: No-   dysuria, change in color of urine, no urgency or frequency.  No- flank pain. MS:  No-   joint pain or swelling.  No- decreased range of motion.  No- back pain. Neuro-     nothing unusual Psych:  No- change in mood or affect. No depression or anxiety.  No memory loss.  OBJ- Physical Exam General- Alert, Oriented, Affect-appropriate, Distress- none acute Skin- rash-none, lesions- none, excoriation- none Lymphadenopathy- none Head- atraumatic            Eyes- Gross vision intact, PERRLA, conjunctivae and secretions clear            Ears- Hearing, canals-normal            Nose- Clear, no-Septal dev, mucus, polyps, erosion, perforation             Throat- Mallampati III , mucosa clear , drainage- none, tonsils- atrophic Neck- flexible , trachea midline, no stridor , thyroid nl, carotid no bruit Chest - symmetrical excursion , unlabored           Heart/CV- RRR , no murmur , no gallop  , no rub, nl s1 s2                           - JVD- none , edema- none, stasis changes- none,  varices- none           Lung- clear  to P&A except coarser in R apex, wheeze- none, cough- none , dullness-none, rub- none           Chest wall-  Abd- tender-no, distended-no, bowel sounds-present, HSM- no Br/ Gen/ Rectal- Not done, not indicated Extrem- cyanosis- none, clubbing, none, atrophy- none, strength- nl Neuro- grossly intact to observation

## 2014-06-25 NOTE — Assessment & Plan Note (Signed)
Story would fit a cough equivalent asthma, probably with an allergic component. We can do allergy assessment later. We will try a maintenance inhaler to see if we can get early symptom control Plan -samples Breo Ellipta with instruction.

## 2014-07-15 ENCOUNTER — Other Ambulatory Visit: Payer: Self-pay | Admitting: Cardiology

## 2014-08-10 ENCOUNTER — Telehealth: Payer: Self-pay | Admitting: Cardiology

## 2014-08-10 NOTE — Telephone Encounter (Signed)
Left message to call back  

## 2014-08-10 NOTE — Telephone Encounter (Signed)
New Message         Pt calling stating that she has had shingles before and wants to know if she can get the shingles vaccine. Please call back and advise.

## 2014-08-11 NOTE — Telephone Encounter (Signed)
Discussed with  Dr. Mare Ferrari and yes now recommended even if patient has had shingles Advised patient

## 2014-08-11 NOTE — Telephone Encounter (Signed)
Follow Up  Pt returned call from VM. Please call

## 2014-09-14 ENCOUNTER — Other Ambulatory Visit: Payer: Self-pay | Admitting: Cardiology

## 2014-10-02 ENCOUNTER — Other Ambulatory Visit (INDEPENDENT_AMBULATORY_CARE_PROVIDER_SITE_OTHER): Payer: 59 | Admitting: *Deleted

## 2014-10-02 DIAGNOSIS — I119 Hypertensive heart disease without heart failure: Secondary | ICD-10-CM

## 2014-10-02 DIAGNOSIS — E039 Hypothyroidism, unspecified: Secondary | ICD-10-CM

## 2014-10-02 LAB — BASIC METABOLIC PANEL
BUN: 18 mg/dL (ref 6–23)
CO2: 25 mEq/L (ref 19–32)
CREATININE: 0.93 mg/dL (ref 0.40–1.20)
Calcium: 9.3 mg/dL (ref 8.4–10.5)
Chloride: 104 mEq/L (ref 96–112)
GFR: 64.91 mL/min (ref >60.00)
Glucose, Bld: 91 mg/dL (ref 70–99)
POTASSIUM: 4.3 meq/L (ref 3.5–5.1)
Sodium: 138 mEq/L (ref 135–145)

## 2014-10-02 LAB — LIPID PANEL
CHOLESTEROL: 184 mg/dL (ref 0–200)
HDL: 76.5 mg/dL (ref >39.00)
LDL Cholesterol: 84 mg/dL (ref 0–99)
NONHDL: 107.5
Total CHOL/HDL Ratio: 2
Triglycerides: 117 mg/dL (ref 0.0–149.0)
VLDL: 23.4 mg/dL (ref 0.0–40.0)

## 2014-10-02 LAB — HEPATIC FUNCTION PANEL
ALT: 25 U/L (ref 0–35)
AST: 29 U/L (ref 0–37)
Albumin: 4.4 g/dL (ref 3.5–5.2)
Alkaline Phosphatase: 60 U/L (ref 39–117)
Bilirubin, Direct: 0.1 mg/dL (ref 0.0–0.3)
Total Bilirubin: 0.5 mg/dL (ref 0.2–1.2)
Total Protein: 7.4 g/dL (ref 6.0–8.3)

## 2014-10-02 LAB — T4, FREE: FREE T4: 1.12 ng/dL (ref 0.60–1.60)

## 2014-10-02 LAB — TSH: TSH: 0.52 u[IU]/mL (ref 0.35–4.50)

## 2014-10-03 NOTE — Progress Notes (Signed)
Quick Note:  Please make copy of labs for patient visit. ______ 

## 2014-10-07 ENCOUNTER — Ambulatory Visit (INDEPENDENT_AMBULATORY_CARE_PROVIDER_SITE_OTHER): Payer: 59 | Admitting: Cardiology

## 2014-10-07 ENCOUNTER — Encounter: Payer: Self-pay | Admitting: Cardiology

## 2014-10-07 VITALS — BP 136/80 | HR 59 | Ht 64.0 in | Wt 138.0 lb

## 2014-10-07 DIAGNOSIS — E7800 Pure hypercholesterolemia, unspecified: Secondary | ICD-10-CM

## 2014-10-07 DIAGNOSIS — R011 Cardiac murmur, unspecified: Secondary | ICD-10-CM

## 2014-10-07 DIAGNOSIS — E039 Hypothyroidism, unspecified: Secondary | ICD-10-CM

## 2014-10-07 DIAGNOSIS — I119 Hypertensive heart disease without heart failure: Secondary | ICD-10-CM

## 2014-10-07 NOTE — Patient Instructions (Signed)
Your physician recommends that you continue on your current medications as directed. Please refer to the Current Medication list given to you today.  Your physician wants you to follow-up in: 6 months with fasting labs (lp/bmet/hfp/tsh/ft4) You will receive a reminder letter in the mail two months in advance. If you don't receive a letter, please call our office to schedule the follow-up appointment.

## 2014-10-07 NOTE — Progress Notes (Signed)
Cardiology Office Note   Date:  10/07/2014   ID:  Brittany Washington, DOB 01-Apr-1951, MRN [id number]  PCP:  Brittany Washington  Cardiologist:   Brittany Washington   No chief complaint on file.     History of Present Illness: Brittany Washington is a 64 y.o. female who presents for a six-month follow-up office visit.  This pleasant 64 year old Caucasian female is seen for a scheduled 6 month followup office visit. 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 She also has a history of known heart murmur and a past history of abnormal liver function studies. She has a history of mild dyslipidemia but is not on statins because of her abnormal liver function studies. In the past her transaminases have been elevated significantly and have improved after she cut back on wine and on nonsteroidal anti-inflammatories such as ibuprofen. Since last visit she had her husband have gone on a heart healthy prudent diet. She has lost about 32 pounds and her husband has lost a similar amount. They both also go to the [facility] on a regular basis. 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  Since last visit she has been feeling well.  She's had no chest pain or shortness of breath.  She tries to ride her exercise bike for an hour a day.  She has not been having a recent back pain.  Since we last saw her she had normal colonoscopy with Brittany Washington.  Past Medical History  Diagnosis Date  . GERD (gastroesophageal reflux disease)   . Hypothyroidism   . Arthritis   . Benign hypertensive heart disease without heart failure   . Dyslipidemia     MILD  . Heart murmur, systolic     MECHNICAL  . Hypertension     mild  . Osteoporosis     Past Surgical History  Procedure Laterality Date  . Trigger finger release  01/09/2012    Procedure: RELEASE TRIGGER FINGER/A-1 PULLEY;  Surgeon: Brittany Sickle.,  [place];  Location: [facility];  Service: Orthopedics;  Laterality: Left;  . Lumbar diskectomy w/ dissection, left l4 -- l5  02-23-2004  . Re-do lumbar diskectomy , l4  - l5  05-03-2004  . Posterior lumbar fusion  05-16-2005    L4 -- L5  . Orif left distal radial fx w/ bone graft  01-14-2009  . Laparoscopic cholecystectomy  1995  . Vaginal hysterectomy  1993    W/ BILATERAL SALPINGOOPHORECTOMY  . Hammer toe surgery Left 07/14/2013    Procedure: HAMMER TOE CORRECTION LEFT SECOND TOE, AUSTIN/AKIN LEFT;  Surgeon: Brittany Washington;  Location: [facility];  Service: Podiatry;  Laterality: Left;  . Colonoscopy    . Wrist fracture surgery      left     Current Outpatient Prescriptions  Medication Sig Dispense Refill  . aspirin 81 MG tablet Take 81 mg by mouth 2 (two) times a week.     . cetirizine (ZYRTEC) 10 MG tablet Take 10 mg by mouth daily.    . clonazePAM (KLONOPIN) 1 MG tablet TAKE 1 TO 2 TABLETS BY MOUTH EACH NIGHT AT BEDTIME AS NEEDED 60 tablet 5  . hydrochlorothiazide (MICROZIDE) 12.5 MG capsule Take 12.5 mg by mouth every other day.    . ibuprofen (MOTRIN IB) 600 MG tablet Take 1 tablet (600 mg total) by mouth 2 (  two) times daily as needed. 90 tablet 1  . levothyroxine (SYNTHROID, LEVOTHROID) 112 MCG tablet TAKE 1 TABLET BY MOUTH ONCE DAILY 90 tablet 1  . pantoprazole (PROTONIX) 40 MG tablet TAKE 1 TABLET BY MOUTH ONCE DAILY 90 tablet 5  . raloxifene (EVISTA) 60 MG tablet Take 60 mg by mouth daily.    Marland Kitchen ZOSTAVAX 95284 UNT/0.65ML injection   0   No current facility-administered medications for this visit.    Allergies:   Sulfa antibiotics    Social History:  The patient  reports that she has never smoked. She has never used smokeless tobacco. She reports that she drinks about 4.2 oz of alcohol per week. She reports that she does not use illicit drugs.   Family History:  The patient's family history includes Colon cancer in her father; Heart attack  in her brother and father; Ovarian cancer in her mother. There is no history of Rectal cancer, Stomach cancer, or Breast cancer.    ROS:  Please see the history of present illness.   Otherwise, review of systems are positive for none.   All other systems are reviewed and negative.    PHYSICAL EXAM: VS:  BP 136/80 mmHg  Pulse 59  Ht 5\' 4"  (1.626 m)  Wt 138 lb (62.596 kg)  BMI 23.68 kg/m2 , BMI Body mass index is 23.68 kg/(m^2). GEN: Well nourished, well developed, in no acute distress HEENT: normal Neck: no JVD, carotid bruits, or masses Cardiac: RRR; norubs, or gallops,no edema. 1/6 systolic ejection murmur at the base Respiratory:  clear to auscultation bilaterally, normal work of breathing GI: soft, nontender, nondistended, + BS MS: no deformity or atrophy Skin: warm and dry, no rash Neuro:  Strength and sensation are intact Psych: euthymic mood, full affect   EKG:  EKG is ordered today. The ekg ordered today demonstrates normal sinus rhythm.  No ischemic changes.   Recent Labs: 10/02/2014: ALT 25; BUN 18; Creatinine 0.93; Potassium 4.3; Sodium 138; TSH 0.52    Lipid Panel    Component Value Date/Time   CHOL 184 10/02/2014 0904   TRIG 117.0 10/02/2014 0904   HDL 76.50 10/02/2014 0904   CHOLHDL 2 10/02/2014 0904   VLDL 23.4 10/02/2014 0904   LDLCALC 84 10/02/2014 0904   LDLDIRECT 78.9 01/21/2013 0822      Wt Readings from Last 3 Encounters:  10/07/14 138 lb (62.596 kg)  06/25/14 140 lb 9.6 oz (63.776 kg)  06/11/14 137 lb (62.143 kg)      Other studies Reviewed: Additional studies/ records that were reviewed today include: Mammogram Review of the above records demonstrates: No malignancy   ASSESSMENT AND PLAN:  1. benign hypertensive heart disease without heart failure. 2. Hypercholesterolemia, significantly improved since she has lost weight.  She is not on statin therapy. 3. heart murmur, benign, stable 4. severe seasonal allergies quiescent at  present 5. past history of abnormal liver function studies, possibly fatty liver, improved with weight loss and stopping his statin therapy 6.  Hypothyroidism, presently euthyroid on medication  Current medicines are reviewed at length with the patient today.  The patient does not have concerns regarding medicines.  The following changes have been made:  no change  Labs/ tests ordered today include:   Orders Placed This Encounter  Procedures  . Lipid panel  . Hepatic function panel  . Basic metabolic panel  . TSH  . T4, free  . EKG 12-Lead     Disposition:   FU with Dr. Mare Ferrari  in 6 months for office visit lipid panel basal metabolic panel hepatic function panel and TSH and free T4   Signed, Brittany Washington  10/07/2014 5:25 PM    [facility] [address], [place], [place]  [address] Phone: [phone]; Fax: [phone]

## 2014-10-12 ENCOUNTER — Other Ambulatory Visit: Payer: Self-pay | Admitting: Cardiology

## 2014-10-12 NOTE — Telephone Encounter (Signed)
OK to refill

## 2014-10-14 NOTE — Telephone Encounter (Signed)
Okay to refill? 

## 2014-10-16 ENCOUNTER — Other Ambulatory Visit: Payer: Self-pay | Admitting: Cardiology

## 2014-10-19 ENCOUNTER — Other Ambulatory Visit: Payer: Self-pay

## 2014-10-19 MED ORDER — LEVOTHYROXINE SODIUM 112 MCG PO TABS: 112.0000 ug | ORAL_TABLET | Freq: Every day | ORAL | Status: DC

## 2014-12-17 ENCOUNTER — Other Ambulatory Visit: Payer: Self-pay | Admitting: Cardiology

## 2014-12-17 NOTE — Telephone Encounter (Signed)
Okay to refill klonopin

## 2014-12-18 ENCOUNTER — Other Ambulatory Visit (HOSPITAL_COMMUNITY): Payer: Self-pay | Admitting: Obstetrics and Gynecology

## 2014-12-18 DIAGNOSIS — M8580 Other specified disorders of bone density and structure, unspecified site: Secondary | ICD-10-CM

## 2014-12-28 ENCOUNTER — Ambulatory Visit (HOSPITAL_COMMUNITY)
Admission: RE | Admit: 2014-12-28 | Discharge: 2014-12-28 | Disposition: A | Discharge status: Home / Self Care | Payer: 59 | Source: Ambulatory Visit | Attending: Obstetrics and Gynecology | Admitting: Obstetrics and Gynecology

## 2014-12-28 DIAGNOSIS — Z13820 Encounter for screening for osteoporosis: Secondary | ICD-10-CM | POA: Insufficient documentation

## 2014-12-28 DIAGNOSIS — M8580 Other specified disorders of bone density and structure, unspecified site: Secondary | ICD-10-CM

## 2014-12-28 DIAGNOSIS — Z780 Asymptomatic menopausal state: Secondary | ICD-10-CM | POA: Insufficient documentation

## 2015-03-12 ENCOUNTER — Other Ambulatory Visit: Payer: Self-pay | Admitting: Orthopaedic Surgery

## 2015-03-29 ENCOUNTER — Encounter (HOSPITAL_BASED_OUTPATIENT_CLINIC_OR_DEPARTMENT_OTHER): Payer: Self-pay | Admitting: *Deleted

## 2015-03-29 NOTE — H&P (Signed)
Brittany Washington is an 64 y.o. female.   Chief Complaint: Left foot pain HPI: Brittany Washington is here again about her foot.  She's had some orthotic modifications which she says have made things worse.  The pain remains on the undersurface of her left foot in the area of the prominent callus.  She is here with her husband, Brittany Washington.  She has trouble walking without discomfort.  Imaging/Tests: We took 3 views of the left foot. She has a fairly good correction. I am worried about the proximal phalanx in that she seems to have some fragmentation of this bone which might represent chronic infection versus avascular change. There is advanced degenerative change at the first MTP joint at this time. She has a screw in the first metatarsal and a staple in the proximal phalanx. It also looks to me as though the second MTP joint is dislocated.  Past Medical History  Diagnosis Date  . Hypothyroidism   . Arthritis   . Benign hypertensive heart disease without heart failure   . Dyslipidemia     MILD  . Heart murmur, systolic     MECHNICAL  . Osteoporosis   . Hypertension     mild, lost weight, no meds now    Past Surgical History  Procedure Laterality Date  . Trigger finger release  01/09/2012    Procedure: RELEASE TRIGGER FINGER/A-1 PULLEY;  Surgeon: [name], MD;  Location: [facility];  Service: Orthopedics;  Laterality: Left;  . Lumbar diskectomy w/ dissection, left l4 -- l5  02-23-2004  . Re-do lumbar diskectomy , l4  - l5  05-03-2004  . Posterior lumbar fusion  05-16-2005    L4 -- L5  . Orif left distal radial fx w/ bone graft  01-14-2009  . Laparoscopic cholecystectomy  1995  . Vaginal hysterectomy  1993    W/ BILATERAL SALPINGOOPHORECTOMY  . Hammer toe surgery Left 07/14/2013    Procedure: HAMMER TOE CORRECTION LEFT SECOND TOE, AUSTIN/AKIN LEFT;  Surgeon: [name], DPM;  Location: [facility];  Service: Podiatry;  Laterality: Left;  . Colonoscopy    .  Wrist fracture surgery      left    Family History  Problem Relation Age of Onset  . Heart attack Father   . Colon cancer Father   . Heart attack Brother   . Ovarian cancer Mother   . Rectal cancer Neg Hx   . Stomach cancer Neg Hx   . Breast cancer Neg Hx    Social History:  reports that she has never smoked. She has never used smokeless tobacco. She reports that she drinks about 4.2 oz of alcohol per week. She reports that she does not use illicit drugs.  Allergies:  Allergies  Allergen Reactions  . Sulfa Antibiotics Other (See Comments)    Body aches    No prescriptions prior to admission    No results found for this or any previous visit (from the past 48 hour(s)). No results found.  Review of Systems  Musculoskeletal: Positive for joint pain.       Left foot  All other systems reviewed and are negative.   Height 5\' 4"  (1.626 m), weight 63.504 kg (140 lb). Physical Exam  Constitutional: She is oriented to person, place, and time. She appears well-developed and well-nourished.  HENT:  Head: Normocephalic and atraumatic.  Eyes: Pupils are equal, round, and reactive to light.  Neck: Normal range of motion.  Cardiovascular: Normal rate  and regular rhythm.   Respiratory: Effort normal.  GI: Soft.  Musculoskeletal:  Left foot has the healed first MTP incision.  She has a shortened first toe.  She has some flexible hammertoes.  There is a large callus under the forefoot underwent and I believe this is at the third metatarsal head.  She has exquisite pain to palpation over what I believe is the third metatarsal head.  Sensation and motor function are intact distally.  She has good refill to the tips of her toes and a palpable pulse at the ankle.    Neurological: She is alert and oriented to person, place, and time.  Skin: Skin is warm and dry.  Psychiatric: She has a normal mood and affect. Her behavior is normal. Judgment and thought content normal.      Assessment/Plan Assessment: Left foot metatarsalgia with history of hallux valgus surgery 2014  Plan: Brittany Washington would like something done about her foot.  She's tried various shoe and orthotic modifications and rubs and pills.  She persists with pain which I believe is due to a plantargrade third metatarsal.  I've offered her a surgery which will be a metatarsal osteotomy done dorsally.  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  I reviewed risk of anesthesia and infection.  Unfortunately she did apparently experience an infection with her podiatry surgery about 2 years back.  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  Brittany Washington, Brittany Washington 03/29/2015, 3:01 PM

## 2015-04-02 ENCOUNTER — Ambulatory Visit (HOSPITAL_BASED_OUTPATIENT_CLINIC_OR_DEPARTMENT_OTHER): Payer: 59 | Admitting: Anesthesiology

## 2015-04-02 ENCOUNTER — Encounter (HOSPITAL_BASED_OUTPATIENT_CLINIC_OR_DEPARTMENT_OTHER): Payer: Self-pay | Admitting: Certified Registered"

## 2015-04-02 ENCOUNTER — Ambulatory Visit (HOSPITAL_BASED_OUTPATIENT_CLINIC_OR_DEPARTMENT_OTHER)
Admission: RE | Admit: 2015-04-02 | Discharge: 2015-04-02 | Disposition: A | Discharge status: Home / Self Care | Payer: 59 | Source: Ambulatory Visit | Attending: Orthopaedic Surgery | Admitting: Orthopaedic Surgery

## 2015-04-02 ENCOUNTER — Encounter (HOSPITAL_BASED_OUTPATIENT_CLINIC_OR_DEPARTMENT_OTHER)
Admission: RE | Disposition: A | Discharge status: Home / Self Care | Payer: Self-pay | Source: Ambulatory Visit | Attending: Orthopaedic Surgery

## 2015-04-02 DIAGNOSIS — I10 Essential (primary) hypertension: Secondary | ICD-10-CM | POA: Diagnosis not present

## 2015-04-02 DIAGNOSIS — E785 Hyperlipidemia, unspecified: Secondary | ICD-10-CM | POA: Diagnosis not present

## 2015-04-02 DIAGNOSIS — M1990 Unspecified osteoarthritis, unspecified site: Secondary | ICD-10-CM | POA: Diagnosis not present

## 2015-04-02 DIAGNOSIS — E039 Hypothyroidism, unspecified: Secondary | ICD-10-CM | POA: Diagnosis not present

## 2015-04-02 DIAGNOSIS — M7742 Metatarsalgia, left foot: Secondary | ICD-10-CM | POA: Insufficient documentation

## 2015-04-02 HISTORY — PX: METATARSAL OSTEOTOMY: SHX1641

## 2015-04-02 SURGERY — OSTEOTOMY, METATARSAL BONE
Anesthesia: Monitor Anesthesia Care | Site: Foot | Laterality: Left

## 2015-04-02 MED ORDER — MIDAZOLAM HCL 2 MG/2ML IJ SOLN
1.0000 mg | INTRAMUSCULAR | Status: DC | PRN
  Administered 2015-04-02: 1 mg via INTRAVENOUS

## 2015-04-02 MED ORDER — OXYCODONE-ACETAMINOPHEN 5-325 MG PO TABS: 1.0000 | ORAL_TABLET | Freq: Four times a day (QID) | ORAL | Status: DC | PRN

## 2015-04-02 MED ORDER — BUPIVACAINE-EPINEPHRINE (PF) 0.5% -1:200000 IJ SOLN
INTRAMUSCULAR | Status: DC | PRN
  Administered 2015-04-02: 30 mL via PERINEURAL

## 2015-04-02 MED ORDER — GLYCOPYRROLATE 0.2 MG/ML IJ SOLN: 0.2000 mg | Freq: Once | INTRAMUSCULAR | Status: DC | PRN

## 2015-04-02 MED ORDER — LACTATED RINGERS IV SOLN: INTRAVENOUS | Status: DC

## 2015-04-02 MED ORDER — FENTANYL CITRATE (PF) 100 MCG/2ML IJ SOLN
INTRAMUSCULAR | Status: AC
  Filled 2015-04-02: qty 2

## 2015-04-02 MED ORDER — CEFAZOLIN SODIUM-DEXTROSE 2-3 GM-% IV SOLR
INTRAVENOUS | Status: AC
  Filled 2015-04-02: qty 50

## 2015-04-02 MED ORDER — MIDAZOLAM HCL 2 MG/2ML IJ SOLN
INTRAMUSCULAR | Status: AC
Start: 2015-04-02 — End: 2015-04-02
  Filled 2015-04-02: qty 2

## 2015-04-02 MED ORDER — PROPOFOL INFUSION 10 MG/ML OPTIME
INTRAVENOUS | Status: DC | PRN
  Administered 2015-04-02: 100 ug/kg/min via INTRAVENOUS

## 2015-04-02 MED ORDER — LIDOCAINE HCL (CARDIAC) 20 MG/ML IV SOLN
INTRAVENOUS | Status: DC | PRN
  Administered 2015-04-02: 20 mg via INTRAVENOUS

## 2015-04-02 MED ORDER — BUPIVACAINE HCL (PF) 0.5 % IJ SOLN
INTRAMUSCULAR | Status: DC | PRN
  Administered 2015-04-02: 10 mL

## 2015-04-02 MED ORDER — SCOPOLAMINE 1 MG/3DAYS TD PT72: 1.0000 | MEDICATED_PATCH | Freq: Once | TRANSDERMAL | Status: DC | PRN

## 2015-04-02 MED ORDER — FENTANYL CITRATE (PF) 100 MCG/2ML IJ SOLN
INTRAMUSCULAR | Status: AC
  Filled 2015-04-02: qty 4

## 2015-04-02 MED ORDER — ONDANSETRON HCL 4 MG/2ML IJ SOLN
INTRAMUSCULAR | Status: DC | PRN
  Administered 2015-04-02: 4 mg via INTRAVENOUS

## 2015-04-02 MED ORDER — FENTANYL CITRATE (PF) 100 MCG/2ML IJ SOLN
50.0000 ug | INTRAMUSCULAR | Status: DC | PRN
  Administered 2015-04-02: 100 ug via INTRAVENOUS

## 2015-04-02 MED ORDER — LACTATED RINGERS IV SOLN
INTRAVENOUS | Status: DC
  Administered 2015-04-02: 10 mL/h via INTRAVENOUS
  Administered 2015-04-02: 13:00:00 via INTRAVENOUS

## 2015-04-02 MED ORDER — CEFAZOLIN SODIUM-DEXTROSE 2-3 GM-% IV SOLR
2.0000 g | INTRAVENOUS | Status: AC
  Administered 2015-04-02: 2 g via INTRAVENOUS

## 2015-04-02 MED ORDER — HYDROMORPHONE HCL 1 MG/ML IJ SOLN: 0.2500 mg | INTRAMUSCULAR | Status: DC | PRN

## 2015-04-02 MED ORDER — CHLORHEXIDINE GLUCONATE 4 % EX LIQD: 60.0000 mL | Freq: Once | CUTANEOUS | Status: DC

## 2015-04-02 SURGICAL SUPPLY — 74 items
BANDAGE ELASTIC 3 VELCRO ST LF (GAUZE/BANDAGES/DRESSINGS) ×2 IMPLANT
BANDAGE ESMARK 6X9 LF (GAUZE/BANDAGES/DRESSINGS) ×1 IMPLANT
BLADE AVERAGE 25X9 (BLADE) IMPLANT
BLADE CRESCENTIC 13.5X.38X32 (BLADE) IMPLANT
BLADE OSC/SAG .038X5.5 CUT EDG (BLADE) ×1 IMPLANT
BLADE SURG 15 STRL LF DISP TIS (BLADE) ×2 IMPLANT
BLADE SURG 15 STRL SS (BLADE) ×4
BNDG CMPR 9X6 STRL LF SNTH (GAUZE/BANDAGES/DRESSINGS) ×1
BNDG ESMARK 6X9 LF (GAUZE/BANDAGES/DRESSINGS) ×2
BNDG GAUZE ELAST 4 BULKY (GAUZE/BANDAGES/DRESSINGS) ×2 IMPLANT
CANISTER SUCT 1200ML W/VALVE (MISCELLANEOUS) IMPLANT
CAP PIN PROTECTOR ORTHO WHT (CAP) IMPLANT
COVER BACK TABLE 60X90IN (DRAPES) ×2 IMPLANT
CUFF TOURNIQUET SINGLE 18IN (TOURNIQUET CUFF) ×1 IMPLANT
CUFF TOURNIQUET SINGLE 24IN (TOURNIQUET CUFF) IMPLANT
CUFF TOURNIQUET SINGLE 34IN LL (TOURNIQUET CUFF) IMPLANT
DECANTER SPIKE VIAL GLASS SM (MISCELLANEOUS) IMPLANT
DRAPE EXTREMITY T 121X128X90 (DRAPE) ×2 IMPLANT
DRAPE OEC MINIVIEW 54X84 (DRAPES) ×1 IMPLANT
DRAPE U-SHAPE 47X51 STRL (DRAPES) ×1 IMPLANT
DRSG EMULSION OIL 3X3 NADH (GAUZE/BANDAGES/DRESSINGS) ×1 IMPLANT
DRSG PAD ABDOMINAL 8X10 ST (GAUZE/BANDAGES/DRESSINGS) ×1 IMPLANT
DRSG TELFA 3X8 NADH (GAUZE/BANDAGES/DRESSINGS) IMPLANT
DURAPREP 26ML APPLICATOR (WOUND CARE) ×2 IMPLANT
ELECT NDL TIP 2.8 STRL (NEEDLE) ×1 IMPLANT
ELECT NEEDLE TIP 2.8 STRL (NEEDLE) IMPLANT
ELECT REM PT RETURN 9FT ADLT (ELECTROSURGICAL) ×2
ELECTRODE REM PT RTRN 9FT ADLT (ELECTROSURGICAL) ×1 IMPLANT
GAUZE SPONGE 4X4 12PLY STRL (GAUZE/BANDAGES/DRESSINGS) ×2 IMPLANT
GAUZE SPONGE 4X4 16PLY XRAY LF (GAUZE/BANDAGES/DRESSINGS) IMPLANT
GAUZE XEROFORM 1X8 LF (GAUZE/BANDAGES/DRESSINGS) ×1 IMPLANT
GLOVE BIO SURGEON STRL SZ 6.5 (GLOVE) ×1 IMPLANT
GLOVE BIO SURGEON STRL SZ8 (GLOVE) ×4 IMPLANT
GLOVE BIOGEL PI IND STRL 7.0 (GLOVE) IMPLANT
GLOVE BIOGEL PI IND STRL 8 (GLOVE) ×2 IMPLANT
GLOVE BIOGEL PI INDICATOR 7.0 (GLOVE) ×2
GLOVE BIOGEL PI INDICATOR 8 (GLOVE) ×2
GOWN STRL REUS W/ TWL LRG LVL3 (GOWN DISPOSABLE) ×1 IMPLANT
GOWN STRL REUS W/ TWL XL LVL3 (GOWN DISPOSABLE) ×2 IMPLANT
GOWN STRL REUS W/TWL LRG LVL3 (GOWN DISPOSABLE) ×2
GOWN STRL REUS W/TWL XL LVL3 (GOWN DISPOSABLE) ×4
K-WIRE .045X4 (WIRE) IMPLANT
K-WIRE .062X4 (WIRE) IMPLANT
NDL HYPO 25X1 1.5 SAFETY (NEEDLE) IMPLANT
NEEDLE HYPO 22GX1.5 SAFETY (NEEDLE) IMPLANT
NEEDLE HYPO 25X1 1.5 SAFETY (NEEDLE) IMPLANT
NS IRRIG 1000ML POUR BTL (IV SOLUTION) ×2 IMPLANT
PACK BASIN DAY SURGERY FS (CUSTOM PROCEDURE TRAY) ×2 IMPLANT
PAD DRESSING TELFA 3X8 NADH (GAUZE/BANDAGES/DRESSINGS) IMPLANT
PADDING CAST ABS 3INX4YD NS (CAST SUPPLIES)
PADDING CAST ABS 4INX4YD NS (CAST SUPPLIES) ×1
PADDING CAST ABS COTTON 3X4 (CAST SUPPLIES) IMPLANT
PADDING CAST ABS COTTON 4X4 ST (CAST SUPPLIES) ×1 IMPLANT
PENCIL BUTTON HOLSTER BLD 10FT (ELECTRODE) ×2 IMPLANT
SHEET MEDIUM DRAPE 40X70 STRL (DRAPES) ×2 IMPLANT
STOCKINETTE 6  STRL (DRAPES) ×1
STOCKINETTE 6 STRL (DRAPES) ×1 IMPLANT
SUCTION FRAZIER TIP 10 FR DISP (SUCTIONS) IMPLANT
SUT BONE WAX W31G (SUTURE) IMPLANT
SUT ETHILON 3 0 PS 1 (SUTURE) IMPLANT
SUT ETHILON 4 0 PS 2 18 (SUTURE) IMPLANT
SUT ETHILON 5 0 PS 2 18 (SUTURE) IMPLANT
SUT VIC AB 0 CT1 27 (SUTURE)
SUT VIC AB 0 CT1 27XBRD ANBCTR (SUTURE) IMPLANT
SUT VIC AB 2-0 SH 27 (SUTURE)
SUT VIC AB 2-0 SH 27XBRD (SUTURE) IMPLANT
SUT VIC AB 4-0 P-3 18XBRD (SUTURE) IMPLANT
SUT VIC AB 4-0 P3 18 (SUTURE)
SYR BULB 3OZ (MISCELLANEOUS) ×2 IMPLANT
SYR CONTROL 10ML LL (SYRINGE) IMPLANT
TOWEL OR 17X24 6PK STRL BLUE (TOWEL DISPOSABLE) ×4 IMPLANT
TOWEL OR NON WOVEN STRL DISP B (DISPOSABLE) ×2 IMPLANT
TUBE CONNECTING 20X1/4 (TUBING) IMPLANT
UNDERPAD 30X30 (UNDERPADS AND DIAPERS) ×2 IMPLANT

## 2015-04-02 NOTE — Anesthesia Preprocedure Evaluation (Addendum)
Anesthesia Evaluation  Patient identified by MRN, date of birth, ID band Patient awake    Reviewed: Allergy & Precautions, H&P , NPO status , Patient's Chart, lab work & pertinent test results  Airway Mallampati: II  TM Distance: >3 FB Neck ROM: Full    Dental no notable dental hx. (+) Teeth Intact, Dental Advisory Given   Pulmonary neg pulmonary ROS,  breath sounds clear to auscultation  Pulmonary exam normal       Cardiovascular hypertension, Pt. on medications Rhythm:Regular Rate:Normal     Neuro/Psych negative neurological ROS  negative psych ROS   GI/Hepatic negative GI ROS, Neg liver ROS,   Endo/Other  Hypothyroidism   Renal/GU negative Renal ROS  negative genitourinary   Musculoskeletal  (+) Arthritis -, Osteoarthritis,    Abdominal   Peds  Hematology negative hematology ROS (+)   Anesthesia Other Findings   Reproductive/Obstetrics negative OB ROS                            Anesthesia Physical Anesthesia Plan  ASA: II  Anesthesia Plan: MAC and Regional   Post-op Pain Management:    Induction: Intravenous  Airway Management Planned: Simple Face Mask  Additional Equipment:   Intra-op Plan:   Post-operative Plan:   Informed Consent: I have reviewed the patients History and Physical, chart, labs and discussed the procedure including the risks, benefits and alternatives for the proposed anesthesia with the patient or authorized representative who has indicated his/her understanding and acceptance.   Dental advisory given  Plan Discussed with: CRNA  Anesthesia Plan Comments:        Anesthesia Quick Evaluation

## 2015-04-02 NOTE — Op Note (Signed)
#  [id number] 

## 2015-04-02 NOTE — Anesthesia Procedure Notes (Addendum)
Anesthesia Regional Block:  Popliteal block  Pre-Anesthetic Checklist: ,, timeout performed, Correct Patient, Correct Site, Correct Laterality, Correct Procedure, Correct Position, site marked, Risks and benefits discussed, pre-op evaluation, post-op pain management  Laterality: Left  Prep: Maximum Sterile Barrier Precautions used and chloraprep       Needles:  Injection technique: Single-shot  Needle Type: Echogenic Stimulator Needle     Needle Length: 9cm 9 cm Needle Gauge: 21 and 21 G    Additional Needles:  Procedures: ultrasound guided (picture in chart) and nerve stimulator Popliteal block  Nerve Stimulator or Paresthesia:  Response: Peroneal,  Response: Tibial,   Additional Responses:   Narrative:  Start time: 04/02/2015 1:30 PM End time: 04/02/2015 1:40 PM Injection made incrementally with aspirations every 5 mL. Anesthesiologist: Roderic Palau  Additional Notes: 2% Lidocaine skin wheel. Saphenous block with 10cc of 0.5% Bupivicaine plain.   Procedure Name: MAC Date/Time: 04/02/2015 2:45 PM Performed by: Ethelyn Cerniglia D Pre-anesthesia Checklist: Patient identified, Emergency Drugs available, Suction available, Patient being monitored and Timeout performed Patient Re-evaluated:Patient Re-evaluated prior to inductionOxygen Delivery Method: Simple face mask

## 2015-04-02 NOTE — Anesthesia Postprocedure Evaluation (Signed)
  Anesthesia Post-op Note  Patient: Brittany Washington  Procedure(s) Performed: Procedure(s): LEFT FOOT METATARSAL OSTEOTOMY (Left)  Patient Location: PACU  Anesthesia Type: MAC with block Level of Consciousness: awake and alert   Airway and Oxygen Therapy: Patient Spontanous Breathing  Post-op Pain: Controlled  Post-op Assessment: Post-op Vital signs reviewed, Patient's Cardiovascular Status Stable and Respiratory Function Stable  Post-op Vital Signs: Reviewed  Filed Vitals:   04/02/15 1600  BP:   Pulse: 66  Temp:   Resp: 14    Complications: No apparent anesthesia complications

## 2015-04-02 NOTE — Transfer of Care (Signed)
Immediate Anesthesia Transfer of Care Note  Patient: Brittany Washington  Procedure(s) Performed: Procedure(s): LEFT FOOT METATARSAL OSTEOTOMY (Left)  Patient Location: PACU  Anesthesia Type:MAC combined with regional for post-op pain  Level of Consciousness: awake, alert , oriented and patient cooperative  Airway & Oxygen Therapy: Patient Spontanous Breathing  Post-op Assessment: Report given to RN and Post -op Vital signs reviewed and stable  Post vital signs: Reviewed and stable  Last Vitals:  Filed Vitals:   04/02/15 1400  BP:   Pulse: 70  Temp:   Resp: 20    Complications: No apparent anesthesia complications

## 2015-04-02 NOTE — Interval H&P Note (Signed)
OK for surgery PD 

## 2015-04-02 NOTE — Discharge Instructions (Signed)
° ° °  Regional Anesthesia Blocks ° °1. Numbness or the inability to move the "blocked" extremity may last from 3-48 hours after placement. The length of time depends on the medication injected and your individual response to the medication. If the numbness is not going away after 48 hours, call your surgeon. ° °2. The extremity that is blocked will need to be protected until the numbness is gone and the  Strength has returned. Because you cannot feel it, you will need to take extra care to avoid injury. Because it may be weak, you may have difficulty moving it or using it. You may not know what position it is in without looking at it while the block is in effect. ° °3. For blocks in the legs and feet, returning to weight bearing and walking needs to be done carefully. You will need to wait until the numbness is entirely gone and the strength has returned. You should be able to move your leg and foot normally before you try and bear weight or walk. You will need someone to be with you when you first try to ensure you do not fall and possibly risk injury. ° °4. Bruising and tenderness at the needle site are common side effects and will resolve in a few days. ° °5. Persistent numbness or new problems with movement should be communicated to the surgeon or the [facility] ([phone])/ [facility] ([phone]). ° ° ° °Post Anesthesia Home Care Instructions ° °Activity: °Get plenty of rest for the remainder of the day. A responsible adult should stay with you for 24 hours following the procedure.  °For the next 24 hours, DO NOT: °-Drive a car °-Operate machinery °-Drink alcoholic beverages °-Take any medication unless instructed by your physician °-Make any legal decisions or sign important papers. ° °Meals: °Start with liquid foods such as gelatin or soup. Progress to regular foods as tolerated. Avoid greasy, spicy, heavy foods. If nausea and/or vomiting occur, drink only clear liquids until  the nausea and/or vomiting subsides. Call your physician if vomiting continues. ° °Special Instructions/Symptoms: °Your throat may feel dry or sore from the anesthesia or the breathing tube placed in your throat during surgery. If this causes discomfort, gargle with warm salt water. The discomfort should disappear within 24 hours. ° °If you had a scopolamine patch placed behind your ear for the management of post- operative nausea and/or vomiting: ° °1. The medication in the patch is effective for 72 hours, after which it should be removed.  Wrap patch in a tissue and discard in the trash. Wash hands thoroughly with soap and water. °2. You may remove the patch earlier than 72 hours if you experience unpleasant side effects which may include dry mouth, dizziness or visual disturbances. °3. Avoid touching the patch. Wash your hands with soap and water after contact with the patch. °  ° °

## 2015-04-02 NOTE — Progress Notes (Signed)
Assisted Dr. Edmond Fitzgerald with left, ultrasound guided, popliteal/saphenous block. Side rails up, monitors on throughout procedure. See vital signs in flow sheet. Tolerated Procedure well. 

## 2015-04-05 ENCOUNTER — Encounter (HOSPITAL_BASED_OUTPATIENT_CLINIC_OR_DEPARTMENT_OTHER): Payer: Self-pay | Admitting: Orthopaedic Surgery

## 2015-04-05 NOTE — Op Note (Signed)
Brittany Washington, Brittany Washington             ACCOUNT NO.:  [id number]  MEDICAL RECORD NO.:  [id number]  LOCATION:                               FACILITY:  [facility]  PHYSICIAN:  Monico Blitz. Artur Winningham, M.D.DATE OF BIRTH:  Nov 01, 1950  DATE OF PROCEDURE:  04/02/2015 DATE OF DISCHARGE:  04/02/2015                              OPERATIVE REPORT   PREOPERATIVE DIAGNOSIS:  Left foot metatarsalgia.  POSTOPERATIVE DIAGNOSIS:  Left foot metatarsalgia.  PROCEDURE:  Left third metatarsal osteotomy.  ANESTHESIA:  Knee block and MAC.  SURGEON:  Monico Blitz. Rhona Raider, M.D.  ASSISTANT:  Loni Dolly, PA.  INDICATION FOR PROCEDURE:  The patient is a 64 year old woman with a long history of left foot issues.  She has had procedures done in the past.  At this point, she has painful calluses on the plantar aspect of her foot under mostly the third metatarsal head and to a lesser degree in the second metatarsal head.  On exam, she seems to have plantar grade third metatarsal.  She is offered an osteotomy having failed shoe modifications and orthotics.  Informed operative consent was obtained after discussion of possible complications including reaction to anesthesia, infection, nonunion.  SUMMARY OF FINDINGS AND PROCEDURE:  Under regional block and some sedation, I performed a dorsal wedge osteotomy of the third metatarsal. I used fluoroscopic guidance.  This was fairly stable and we did not have to place any hardware.  She was closed primarily and discharged home.  DESCRIPTION OF PROCEDURE:  The patient was taken to the operating suite, where a block was applied followed by some sedation.  She was positioned supine and prepped and draped in normal sterile fashion.  After the administration of preop IV Kefzol and an appropriate time-out, the left foot was elevated, exsanguinated, and tourniquet inflated about the calf.  I made a dorsal incision with dissection down to the base of the third metatarsal.  I used an  oscillating saw to resect a small dorsal wedge of bone leaving the periosteum intact on the plantar aspect.  This could open and close easily with manipulation of the distal portion of this metatarsal.  Once this was accomplished, the metatarsal heads seemed to align appropriately on exam.  The wound was irrigated followed by release of tourniquet.  A small amount of bleeding was easily controlled with some pressure and Bovie cautery.  We reapproximated the skin with nylon suture in interrupted fashion.  Adaptic was applied followed by dry gauze and a loose Ace wrap.  Estimated blood loss and intraoperative fluids can be obtained from anesthesia records as can accurate tourniquet time.  DISPOSITION:  The patient was taken to recovery room in stable condition.  She was to go home the same-day and follow up in the office in less than a week.  I will contact her by phone tonight.     Monico Blitz Rhona Raider, M.D.     PGD/MEDQ  D:  04/02/2015  T:  04/03/2015  Job:  [id number]

## 2015-05-11 ENCOUNTER — Other Ambulatory Visit: Payer: Self-pay | Admitting: Cardiology

## 2015-05-11 DIAGNOSIS — M150 Primary generalized (osteo)arthritis: Principal | ICD-10-CM

## 2015-05-11 DIAGNOSIS — M8949 Other hypertrophic osteoarthropathy, multiple sites: Secondary | ICD-10-CM

## 2015-05-11 DIAGNOSIS — M159 Polyosteoarthritis, unspecified: Secondary | ICD-10-CM

## 2015-05-19 ENCOUNTER — Encounter: Payer: Self-pay | Admitting: Internal Medicine

## 2015-06-16 ENCOUNTER — Other Ambulatory Visit: Payer: Self-pay | Admitting: Cardiology

## 2015-06-17 ENCOUNTER — Other Ambulatory Visit: Payer: Self-pay | Admitting: Cardiology

## 2015-06-17 MED ORDER — PANTOPRAZOLE SODIUM 40 MG PO TBEC: 40.0000 mg | DELAYED_RELEASE_TABLET | Freq: Every day | ORAL | Status: DC

## 2015-06-18 NOTE — Telephone Encounter (Signed)
OK to refill Klonopin

## 2015-06-25 ENCOUNTER — Observation Stay (HOSPITAL_COMMUNITY): Payer: 59

## 2015-06-25 ENCOUNTER — Emergency Department (HOSPITAL_COMMUNITY): Payer: 59

## 2015-06-25 ENCOUNTER — Observation Stay (HOSPITAL_BASED_OUTPATIENT_CLINIC_OR_DEPARTMENT_OTHER): Payer: 59

## 2015-06-25 ENCOUNTER — Observation Stay (HOSPITAL_COMMUNITY)
Admission: EM | Admit: 2015-06-25 | Discharge: 2015-06-26 | Disposition: A | Discharge status: Home / Self Care | Payer: 59 | Attending: Internal Medicine | Admitting: Internal Medicine

## 2015-06-25 ENCOUNTER — Encounter (HOSPITAL_COMMUNITY): Payer: Self-pay

## 2015-06-25 DIAGNOSIS — R51 Headache: Secondary | ICD-10-CM | POA: Insufficient documentation

## 2015-06-25 DIAGNOSIS — Z882 Allergy status to sulfonamides status: Secondary | ICD-10-CM | POA: Insufficient documentation

## 2015-06-25 DIAGNOSIS — G459 Transient cerebral ischemic attack, unspecified: Secondary | ICD-10-CM

## 2015-06-25 DIAGNOSIS — R2689 Other abnormalities of gait and mobility: Secondary | ICD-10-CM | POA: Insufficient documentation

## 2015-06-25 DIAGNOSIS — M1991 Primary osteoarthritis, unspecified site: Secondary | ICD-10-CM | POA: Insufficient documentation

## 2015-06-25 DIAGNOSIS — I10 Essential (primary) hypertension: Secondary | ICD-10-CM | POA: Diagnosis not present

## 2015-06-25 DIAGNOSIS — E785 Hyperlipidemia, unspecified: Secondary | ICD-10-CM | POA: Insufficient documentation

## 2015-06-25 DIAGNOSIS — R42 Dizziness and giddiness: Principal | ICD-10-CM | POA: Insufficient documentation

## 2015-06-25 DIAGNOSIS — G4489 Other headache syndrome: Secondary | ICD-10-CM

## 2015-06-25 DIAGNOSIS — E038 Other specified hypothyroidism: Secondary | ICD-10-CM

## 2015-06-25 DIAGNOSIS — R270 Ataxia, unspecified: Secondary | ICD-10-CM

## 2015-06-25 DIAGNOSIS — Z7982 Long term (current) use of aspirin: Secondary | ICD-10-CM | POA: Diagnosis not present

## 2015-06-25 DIAGNOSIS — K219 Gastro-esophageal reflux disease without esophagitis: Secondary | ICD-10-CM | POA: Insufficient documentation

## 2015-06-25 DIAGNOSIS — R4701 Aphasia: Secondary | ICD-10-CM | POA: Diagnosis not present

## 2015-06-25 DIAGNOSIS — M810 Age-related osteoporosis without current pathological fracture: Secondary | ICD-10-CM | POA: Insufficient documentation

## 2015-06-25 DIAGNOSIS — I119 Hypertensive heart disease without heart failure: Secondary | ICD-10-CM | POA: Diagnosis not present

## 2015-06-25 DIAGNOSIS — R519 Headache, unspecified: Secondary | ICD-10-CM | POA: Diagnosis present

## 2015-06-25 DIAGNOSIS — E860 Dehydration: Secondary | ICD-10-CM | POA: Diagnosis not present

## 2015-06-25 DIAGNOSIS — R2990 Unspecified symptoms and signs involving the nervous system: Secondary | ICD-10-CM

## 2015-06-25 DIAGNOSIS — Z79899 Other long term (current) drug therapy: Secondary | ICD-10-CM | POA: Insufficient documentation

## 2015-06-25 DIAGNOSIS — I6523 Occlusion and stenosis of bilateral carotid arteries: Secondary | ICD-10-CM | POA: Insufficient documentation

## 2015-06-25 DIAGNOSIS — R4789 Other speech disturbances: Secondary | ICD-10-CM | POA: Insufficient documentation

## 2015-06-25 DIAGNOSIS — E039 Hypothyroidism, unspecified: Secondary | ICD-10-CM | POA: Diagnosis not present

## 2015-06-25 DIAGNOSIS — R011 Cardiac murmur, unspecified: Secondary | ICD-10-CM | POA: Insufficient documentation

## 2015-06-25 LAB — COMPREHENSIVE METABOLIC PANEL
ALBUMIN: 4.4 g/dL (ref 3.5–5.0)
ALK PHOS: 69 U/L (ref 38–126)
ALT: 34 U/L (ref 14–54)
ANION GAP: 11 (ref 5–15)
AST: 37 U/L (ref 15–41)
BUN: 18 mg/dL (ref 6–20)
CO2: 21 mmol/L — AB (ref 22–32)
Calcium: 9.6 mg/dL (ref 8.9–10.3)
Chloride: 108 mmol/L (ref 101–111)
Creatinine, Ser: 1.06 mg/dL — ABNORMAL HIGH (ref 0.44–1.00)
GFR calc Af Amer: >60 mL/min (ref >60)
GFR calc non Af Amer: 55 mL/min — ABNORMAL LOW (ref >60)
Glucose, Bld: 119 mg/dL — ABNORMAL HIGH (ref 65–99)
Potassium: 3.9 mmol/L (ref 3.5–5.1)
Sodium: 140 mmol/L (ref 135–145)
Total Bilirubin: 0.6 mg/dL (ref 0.3–1.2)
Total Protein: 8 g/dL (ref 6.5–8.1)

## 2015-06-25 LAB — URINALYSIS, ROUTINE W REFLEX MICROSCOPIC
BILIRUBIN URINE: NEGATIVE
Glucose, UA: NEGATIVE mg/dL
HGB URINE DIPSTICK: NEGATIVE
Ketones, ur: NEGATIVE mg/dL
Leukocytes, UA: NEGATIVE
Nitrite: NEGATIVE
PH: 5.5 (ref 5.0–8.0)
Protein, ur: NEGATIVE mg/dL
SPECIFIC GRAVITY, URINE: 1.019 (ref 1.005–1.030)
UROBILINOGEN UA: 0.2 mg/dL (ref 0.0–1.0)

## 2015-06-25 LAB — DIFFERENTIAL
BASOS PCT: 0 %
Basophils Absolute: 0 10*3/uL (ref 0.0–0.1)
EOS PCT: 1 %
Eosinophils Absolute: 0.1 10*3/uL (ref 0.0–0.7)
LYMPHS PCT: 38 %
Lymphs Abs: 2.2 10*3/uL (ref 0.7–4.0)
MONO ABS: 0.4 10*3/uL (ref 0.1–1.0)
Monocytes Relative: 7 %
Neutro Abs: 3.1 10*3/uL (ref 1.7–7.7)
Neutrophils Relative %: 54 %

## 2015-06-25 LAB — I-STAT CHEM 8, ED
BUN: 22 mg/dL — ABNORMAL HIGH (ref 6–20)
CALCIUM ION: 1.11 mmol/L — AB (ref 1.13–1.30)
Chloride: 107 mmol/L (ref 101–111)
Creatinine, Ser: 0.9 mg/dL (ref 0.44–1.00)
Glucose, Bld: 117 mg/dL — ABNORMAL HIGH (ref 65–99)
HCT: 47 % — ABNORMAL HIGH (ref 36.0–46.0)
HEMOGLOBIN: 16 g/dL — AB (ref 12.0–15.0)
Potassium: 3.8 mmol/L (ref 3.5–5.1)
SODIUM: 141 mmol/L (ref 135–145)
TCO2: 20 mmol/L (ref 0–100)

## 2015-06-25 LAB — CBC
HCT: 43 % (ref 36.0–46.0)
Hemoglobin: 14.6 g/dL (ref 12.0–15.0)
MCH: 33 pg (ref 26.0–34.0)
MCHC: 34 g/dL (ref 30.0–36.0)
MCV: 97.1 fL (ref 78.0–100.0)
Platelets: 175 10*3/uL (ref 150–400)
RBC: 4.43 MIL/uL (ref 3.87–5.11)
RDW: 12 % (ref 11.5–15.5)
WBC: 5.9 10*3/uL (ref 4.0–10.5)

## 2015-06-25 LAB — PROTIME-INR
INR: 0.96 (ref 0.00–1.49)
Prothrombin Time: 13 seconds (ref 11.6–15.2)

## 2015-06-25 LAB — RAPID URINE DRUG SCREEN, HOSP PERFORMED
AMPHETAMINES: NOT DETECTED
Barbiturates: NOT DETECTED
Benzodiazepines: POSITIVE — AB
COCAINE: NOT DETECTED
OPIATES: POSITIVE — AB
Tetrahydrocannabinol: NOT DETECTED

## 2015-06-25 LAB — APTT: aPTT: 30 seconds (ref 24–37)

## 2015-06-25 LAB — I-STAT TROPONIN, ED: Troponin i, poc: 0.01 ng/mL (ref 0.00–0.08)

## 2015-06-25 LAB — ETHANOL

## 2015-06-25 MED ORDER — IBUPROFEN 200 MG PO TABS: 600.0000 mg | ORAL_TABLET | Freq: Three times a day (TID) | ORAL | Status: DC | PRN

## 2015-06-25 MED ORDER — ENOXAPARIN SODIUM 30 MG/0.3ML ~~LOC~~ SOLN
30.0000 mg | SUBCUTANEOUS | Status: DC
  Administered 2015-06-25: 30 mg via SUBCUTANEOUS
  Filled 2015-06-25: qty 0.3

## 2015-06-25 MED ORDER — ASPIRIN EC 81 MG PO TBEC: 81.0000 mg | DELAYED_RELEASE_TABLET | ORAL | Status: DC

## 2015-06-25 MED ORDER — ASPIRIN 81 MG PO TABS: 81.0000 mg | ORAL_TABLET | ORAL | Status: DC

## 2015-06-25 MED ORDER — LORATADINE 10 MG PO TABS
10.0000 mg | ORAL_TABLET | Freq: Every day | ORAL | Status: DC
  Administered 2015-06-26: 10 mg via ORAL
  Filled 2015-06-25: qty 1

## 2015-06-25 MED ORDER — ONDANSETRON HCL 4 MG/2ML IJ SOLN
4.0000 mg | Freq: Once | INTRAMUSCULAR | Status: AC
  Administered 2015-06-25: 4 mg via INTRAVENOUS
  Filled 2015-06-25: qty 2

## 2015-06-25 MED ORDER — LEVOTHYROXINE SODIUM 112 MCG PO TABS
112.0000 ug | ORAL_TABLET | Freq: Every day | ORAL | Status: DC
  Administered 2015-06-26: 112 ug via ORAL
  Filled 2015-06-25: qty 1

## 2015-06-25 MED ORDER — SODIUM CHLORIDE 0.9 % IV SOLN
INTRAVENOUS | Status: DC
  Administered 2015-06-25: 16:00:00 via INTRAVENOUS

## 2015-06-25 MED ORDER — OXYCODONE-ACETAMINOPHEN 5-325 MG PO TABS: 1.0000 | ORAL_TABLET | Freq: Four times a day (QID) | ORAL | Status: DC | PRN

## 2015-06-25 MED ORDER — HYDROCODONE-ACETAMINOPHEN 5-325 MG PO TABS
1.0000 | ORAL_TABLET | Freq: Four times a day (QID) | ORAL | Status: DC | PRN
  Administered 2015-06-25: 1 via ORAL
  Filled 2015-06-25: qty 1

## 2015-06-25 MED ORDER — SENNOSIDES-DOCUSATE SODIUM 8.6-50 MG PO TABS: 1.0000 | ORAL_TABLET | Freq: Every evening | ORAL | Status: DC | PRN

## 2015-06-25 MED ORDER — RALOXIFENE HCL 60 MG PO TABS
60.0000 mg | ORAL_TABLET | Freq: Every day | ORAL | Status: DC
  Administered 2015-06-25 – 2015-06-26 (×2): 60 mg via ORAL
  Filled 2015-06-25 (×4): qty 1

## 2015-06-25 MED ORDER — IOHEXOL 350 MG/ML SOLN
50.0000 mL | Freq: Once | INTRAVENOUS | Status: AC | PRN
  Administered 2015-06-25: 50 mL via INTRAVENOUS

## 2015-06-25 MED ORDER — PANTOPRAZOLE SODIUM 40 MG PO TBEC: 40.0000 mg | DELAYED_RELEASE_TABLET | Freq: Every day | ORAL | Status: DC | PRN

## 2015-06-25 MED ORDER — STROKE: EARLY STAGES OF RECOVERY BOOK
Freq: Once | Status: AC
  Administered 2015-06-25: 16:00:00
  Filled 2015-06-25: qty 1

## 2015-06-25 MED ORDER — CLONAZEPAM 1 MG PO TABS
1.0000 mg | ORAL_TABLET | Freq: Two times a day (BID) | ORAL | Status: DC | PRN
  Administered 2015-06-25: 1 mg via ORAL
  Filled 2015-06-25: qty 1

## 2015-06-25 NOTE — ED Notes (Signed)
Pt returned from vascular lab, belongings returned and pt updated, pt taken to floor and arrival confirmed with Roselyn Reef, RN on 55M

## 2015-06-25 NOTE — ED Notes (Signed)
MD at bedside. 

## 2015-06-25 NOTE — ED Notes (Signed)
Family at bedside, informed that pt will be admitted to floor. Family says they will return once pt is back from MRI.

## 2015-06-25 NOTE — ED Provider Notes (Signed)
CSN: [id number]     Arrival date & time 06/25/15  3009 History   First MD Initiated Contact with Patient 06/25/15 0840     Chief Complaint  Patient presents with  . Dizziness  . Weakness     (Consider location/radiation/quality/duration/timing/severity/associated sxs/prior Treatment) HPI Comments: Patient presents to the emergency department for evaluation of headache with dizziness, ataxia and aphasia. Patient reports that she went to bed around 9 PM last night. She woke up at 3 AM to go to the bathroom and felt like her "legs were Jell-O". She had difficulty walking. She felt dizzy and very unsteady on her feet. She was unable to get back to bed. Around 6 AM her husband got up and helped her get back to bed. At that time it was noted that she was having difficulty speaking. She reports that she knew what she wanted to say, but words would not come out. This has resolved, patient speaking fluently now. She does not have a history of migraine headaches. Patient reports family history of berry aneurysm in her sister.  Patient is a 64 y.o. female presenting with dizziness and weakness.  Dizziness Associated symptoms: headaches and weakness   Weakness Associated symptoms include headaches.    Past Medical History  Diagnosis Date  . Hypothyroidism   . Arthritis   . Benign hypertensive heart disease without heart failure   . Dyslipidemia     MILD  . Heart murmur, systolic     MECHNICAL  . Osteoporosis   . Hypertension     mild, lost weight, no meds now   Past Surgical History  Procedure Laterality Date  . Trigger finger release  01/09/2012    Procedure: RELEASE TRIGGER FINGER/A-1 PULLEY;  Surgeon: [name], MD;  Location: [facility];  Service: Orthopedics;  Laterality: Left;  . Lumbar diskectomy w/ dissection, left l4 -- l5  02-23-2004  . Re-do lumbar diskectomy , l4  - l5  05-03-2004  . Posterior lumbar fusion  05-16-2005    L4 -- L5  . Orif left distal  radial fx w/ bone graft  01-14-2009  . Laparoscopic cholecystectomy  1995  . Vaginal hysterectomy  1993    W/ BILATERAL SALPINGOOPHORECTOMY  . Hammer toe surgery Left 07/14/2013    Procedure: HAMMER TOE CORRECTION LEFT SECOND TOE, AUSTIN/AKIN LEFT;  Surgeon: [name], DPM;  Location: [facility];  Service: Podiatry;  Laterality: Left;  . Colonoscopy    . Wrist fracture surgery      left  . Metatarsal osteotomy Left 04/02/2015    Procedure: LEFT FOOT METATARSAL OSTEOTOMY;  Surgeon: [name], MD;  Location: [facility];  Service: Orthopedics;  Laterality: Left;   Family History  Problem Relation Age of Onset  . Heart attack Father   . Colon cancer Father   . Heart attack Brother   . Ovarian cancer Mother   . Rectal cancer Neg Hx   . Stomach cancer Neg Hx   . Breast cancer Neg Hx    Social History  Substance Use Topics  . Smoking status: Never Smoker   . Smokeless tobacco: Never Used  . Alcohol Use: 4.2 oz/week    7 Glasses of wine per week     Comment: 1 WINE DAILY   OB History    No data available     Review of Systems  Neurological: Positive for dizziness, weakness and headaches.  All other systems reviewed and are negative.  Allergies  Sulfa antibiotics  Home Medications   Prior to Admission medications   Medication Sig Start Date End Date Taking? Authorizing Provider  aspirin 81 MG tablet Take 81 mg by mouth 2 (two) times a week.     Historical Provider, MD  cetirizine (ZYRTEC) 10 MG tablet Take 10 mg by mouth daily.    Historical Provider, MD  clonazePAM (KLONOPIN) 1 MG tablet TAKE 1-2 TABLETS BY MOUTH EVERY NIGHT AT BEDTIME AS NEEDED 06/19/15   Darlin Coco, MD  ibuprofen (ADVIL,MOTRIN) 600 MG tablet TAKE 1 TABLET BY MOUTH 1-2 TIMES AS DAY AS NEEDED 05/11/15   Darlin Coco, MD  levothyroxine (SYNTHROID, LEVOTHROID) 112 MCG tablet TAKE 1 TABLET BY MOUTH ONCE DAILY 10/19/14   Lendon Colonel, NP   oxyCODONE-acetaminophen (ROXICET) 5-325 MG per tablet Take 1-2 tablets by mouth every 6 (six) hours as needed for severe pain. 04/02/15   Loni Dolly, PA-C  pantoprazole (PROTONIX) 40 MG tablet Take 1 tablet (40 mg total) by mouth daily. 06/17/15   Darlin Coco, MD  raloxifene (EVISTA) 60 MG tablet Take 60 mg by mouth daily.    Historical Provider, MD   BP 184/89 mmHg  Pulse 68  Temp(Src) 98 F (36.7 C) (Oral)  Resp 15  Ht 5\' 4"  (1.626 m)  Wt 140 lb (63.504 kg)  BMI 24.02 kg/m2  SpO2 100% Physical Exam  Constitutional: She is oriented to person, place, and time. She appears well-developed and well-nourished. No distress.  HENT:  Head: Normocephalic and atraumatic.  Right Ear: Hearing normal.  Left Ear: Hearing normal.  Nose: Nose normal.  Mouth/Throat: Oropharynx is clear and moist and mucous membranes are normal.  Eyes: Conjunctivae and EOM are normal. Pupils are equal, round, and reactive to light.  Neck: Normal range of motion. Neck supple.  Cardiovascular: Regular rhythm, S1 normal and S2 normal.  Exam reveals no gallop and no friction rub.   No murmur heard. Pulmonary/Chest: Effort normal and breath sounds normal. No respiratory distress. She exhibits no tenderness.  Abdominal: Soft. Normal appearance and bowel sounds are normal. There is no hepatosplenomegaly. There is no tenderness. There is no rebound, no guarding, no tenderness at McBurney's point and negative Murphy's sign. No hernia.  Musculoskeletal: Normal range of motion.  Neurological: She is alert and oriented to person, place, and time. She has normal strength. No cranial nerve deficit or sensory deficit. Coordination normal. GCS eye subscore is 4. GCS verbal subscore is 5. GCS motor subscore is 6.  Skin: Skin is warm, dry and intact. No rash noted. No cyanosis.  Psychiatric: She has a normal mood and affect. Her speech is normal and behavior is normal. Thought content normal.  Nursing note and vitals  reviewed.   ED Course  Procedures (including critical care time) Labs Review Labs Reviewed  COMPREHENSIVE METABOLIC PANEL - Abnormal; Notable for the following:    CO2 21 (*)    Glucose, Bld 119 (*)    Creatinine, Ser 1.06 (*)    GFR calc non Af Amer 55 (*)    All other components within normal limits  I-STAT CHEM 8, ED - Abnormal; Notable for the following:    BUN 22 (*)    Glucose, Bld 117 (*)    Calcium, Ion 1.11 (*)    Hemoglobin 16.0 (*)    HCT 47.0 (*)    All other components within normal limits  PROTIME-INR  APTT  CBC  DIFFERENTIAL  URINALYSIS, ROUTINE W REFLEX MICROSCOPIC (NOT AT [facility])  ETHANOL  URINE RAPID DRUG SCREEN, HOSP PERFORMED  I-STAT [place], ED    Imaging Review Ct Head Wo Contrast  06/25/2015  CLINICAL DATA:  Dizziness.  Occipital headache. EXAM: CT HEAD WITHOUT CONTRAST TECHNIQUE: Contiguous axial images were obtained from the base of the skull through the vertex without intravenous contrast. COMPARISON:  None. FINDINGS: Centralized atrophy with commensurate ex vacuo dilatation of the ventricular system. Sulcal spaces are preserved, thus arguing against communicating hydrocephalus. Gray-white differentiation is maintained. No CT evidence of acute large territory infarct. No intraparenchymal or extra-axial mass or hemorrhage. Normal configuration of the ventricles and basilar cisterns. No midline shift. Limited visualization of the paranasal sinuses and mastoid air cells is normal. No air-fluid levels. Regional soft tissues appear normal. No displaced calvarial fracture. 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 Electronically Signed   By: Sandi Mariscal M.D.   On: 06/25/2015 09:25   I have personally reviewed and evaluated these images and lab results as part of my medical decision-making.   EKG Interpretation   Date/Time:  Friday June 25 2015 08:44:59 EDT Ventricular Rate:  79 PR Interval:  138 QRS Duration: 91 QT Interval:   392 QTC Calculation: 449 R Axis:   79 Text Interpretation:  Sinus rhythm Probable left atrial enlargement No  previous tracing Confirmed by POLLINA  MD, CHRISTOPHER [phone]) on  06/25/2015 8:53:26 AM      MDM   Final diagnoses:  Expressive aphasia  Expressive aphasia  TIA  Patient presents to the emergency department for evaluation of multiple neurologic symptoms. Last known normal was 9 PM when she went to bed last night. Not a candidate for code stroke or thrombolytic therapy for stroke. Patient awakened at 3 AM with a headache and had difficulty walking. She reports that she had difficulty controlling her legs and she was feeling dizzy. Her husband is a retired Publishing rights manager. He did not see her at that point, but later in the morning, found her unable to walk back to her bedroom. He could tell that she was very ataxic, but there was not any unilateral findings. She did, however, developed an expressive aphasia that has completely resolved at time of arrival to the ER. Her neurologic examination in the ER is normal. She still has slight headache and is nauseated. There is no history of migraine headache, but complex migraine would be considered a possibility. Patient does have a family history of berry aneurysm (sister). Will need further workup to rule out aneurysm. TIA is also considered. Patient will require hospitalization for further management.  As with Dr. Wallie Char, neurology. He will see the patient for consultation. He recommends initiating CT angiogram of head and neck for further workup, that admission for further TIA workup if negative.   Orpah Greek, MD 06/25/15 [phone]

## 2015-06-25 NOTE — Progress Notes (Signed)
VASCULAR LAB PRELIMINARY  PRELIMINARY  PRELIMINARY  PRELIMINARY  Carotid duplex completed.    Preliminary report:  Bilateral:  1-39% ICA stenosis.  Vertebral artery flow is antegrade.      Waneta Fitting, RVT 06/25/2015, 3:46 PM

## 2015-06-25 NOTE — ED Notes (Signed)
Neuro MD at bedside

## 2015-06-25 NOTE — ED Notes (Signed)
Attempted report 

## 2015-06-25 NOTE — Progress Notes (Signed)
Patient arrived to 5M19 from the ED. Patient denied any pain or appears in any distress. Safety precautions and orders reviewed with patient. TELE applied and confirmed. MD paged for notification of patient arrival. Will continue to monitor.   Ave Filter, RN

## 2015-06-25 NOTE — ED Notes (Signed)
Assumed care from [place], [place] - pt remains in vascular lab.  Vascular called and updated on pt's status, will meet pt prior to her going to floor and return belongings.

## 2015-06-25 NOTE — ED Notes (Signed)
Pt here with c/o dizziness, ataxia, as well as aphasia. Pt is alert and oriented X4. Reports she still feels dizzy. Pt

## 2015-06-25 NOTE — ED Notes (Signed)
Pt transported from MRI to vascular study.

## 2015-06-25 NOTE — H&P (Signed)
[facility] History and Physical  Brittany Washington HDQ:[id number] DOB: 11-07-1950 DOA: 06/25/2015  Referring physician: Emergency Department PCP: Warren Danes, MD   CHIEF COMPLAINT: dizzness, headache, gait disturbance  HPI: Brittany Washington is a 64 y.o. female with a past medical history of hypothyroidism, arthritis, osteoporosis and hypertension. Patient intentionally lost 30 pounds last year, was able to discontinue blood pressure medications. Around 3 AM this morning patient woke up with a headache located in the back and top portion of her head. Headache was associated with dizziness. Patient developed to the restroom and noticed that her legs were weak. She was able to go to the restroom and return back to bed. Around 6:30 this am patient woke up dizzy again. She called for her husband who was downstairs. He was able to assist patient down the stairs. Husband is a retired Publishing rights manager and states patient was ataxic on his exam.       ED COURSE:   Vitals: Afebrile. Hypertensive 149/99. Normal HR and 02 saturation 100% on room air. Normal neuro exam by EDP. Neurology consulted.   Labs:   WBC  16         hgb,    16       BUN     22               Creatinine    0.9                     TROP    0.01                   Urinalysis:  unremarkable    EKG:    Sinus rhythm Probable left atrial enlargement No previous tracing Confirmed by POLLINA MD, CHRISTOPHER [phone]) on 06/25/2015 8:53:26 AM    Medications  ondansetron (ZOFRAN) injection 4 mg (4 mg Intravenous Given 06/25/15 0939)    REVIEW OF SYSTEMS:  Constitutional:  No recent weight loss (intentional loss of 30 pounds last year. No night sweats, fevers, chills, fatigue.  HEENT:  ++ headache, no difficulty swallowing, tooth/dental problems, sore throat.  No sneezing, itching, ear ache, nasal congestion, post nasal drip,  Cardio-vascular:  No chest pain, orthopnea, PND, swelling in lower extremities, anasarca,  dizziness, palpitations  GI:  No heartburn, indigestion, abdominal pain, nausea, vomiting, diarrhea, change in bowel habits, loss of appetite  Resp:  No shortness of breath with exertion or at rest. No excess mucus, no productive cough, No non-productive cough, No coughing up of blood.No change in color of mucus.No wheezing.No chest wall deformity  Skin:  ++ Recent BLE rash, resolved. .  GU:  no dysuria, change in color of urine, no urgency or frequency. No flank pain.  Musculoskeletal:  No joint pain or swelling. No decreased range of motion. No back pain.  Psych:  No change in mood or affect. No depression or anxiety. No memory loss.   Past Medical History  Diagnosis Date  . Hypothyroidism   . Arthritis   . Benign hypertensive heart disease without heart failure   . Dyslipidemia     MILD  . Heart murmur, systolic     MECHNICAL  . Osteoporosis   . Hypertension     mild, lost weight, no meds now   Past Surgical History  Procedure Laterality Date  . Trigger finger release  01/09/2012    Procedure: RELEASE TRIGGER FINGER/A-1 PULLEY;  Surgeon: [name], MD;  Location: [facility];  Service: Orthopedics;  Laterality: Left;  . Lumbar diskectomy w/ dissection, left l4 -- l5  02-23-2004  . Re-do lumbar diskectomy , l4  - l5  05-03-2004  . Posterior lumbar fusion  05-16-2005    L4 -- L5  . Orif left distal radial fx w/ bone graft  01-14-2009  . Laparoscopic cholecystectomy  1995  . Vaginal hysterectomy  1993    W/ BILATERAL SALPINGOOPHORECTOMY  . Hammer toe surgery Left 07/14/2013    Procedure: HAMMER TOE CORRECTION LEFT SECOND TOE, AUSTIN/AKIN LEFT;  Surgeon: [name], DPM;  Location: [facility];  Service: Podiatry;  Laterality: Left;  . Colonoscopy    . Wrist fracture surgery      left  . Metatarsal osteotomy Left 04/02/2015    Procedure: LEFT FOOT METATARSAL OSTEOTOMY;  Surgeon: [name], MD;  Location: [facility];  Service: Orthopedics;  Laterality: Left;   Social History:  reports that she has never smoked. She has never used smokeless tobacco. She reports that she drinks about 4.2 oz of alcohol per week. She reports that she does not use illicit drugs. Lives:   At home With:    Husband Assistive devices:  None needed for ambulation.   Allergies  Allergen Reactions  . Sulfa Antibiotics Other (See Comments)    Body aches    Family History  Problem Relation Age of Onset  . Heart attack Father   . Colon cancer Father   . Heart attack Brother   . Ovarian cancer Mother   . Rectal cancer Neg Hx   . Stomach cancer Neg Hx   . Breast cancer Neg Hx     Prior to Admission medications   Medication Sig Start Date End Date Taking? Authorizing Provider  aspirin 81 MG tablet Take 81 mg by mouth 2 (two) times a week.    Yes Historical Provider, MD  cetirizine (ZYRTEC) 10 MG tablet Take 10 mg by mouth daily.   Yes Historical Provider, MD  clonazePAM (KLONOPIN) 1 MG tablet TAKE 1-2 TABLETS BY MOUTH EVERY NIGHT AT BEDTIME AS NEEDED 06/19/15  Yes Darlin Coco, MD  ibuprofen (ADVIL,MOTRIN) 600 MG tablet TAKE 1 TABLET BY MOUTH 1-2 TIMES AS DAY AS NEEDED 05/11/15  Yes Darlin Coco, MD  levothyroxine (SYNTHROID, LEVOTHROID) 112 MCG tablet TAKE 1 TABLET BY MOUTH ONCE DAILY 10/19/14  Yes Lendon Colonel, NP  pantoprazole (PROTONIX) 40 MG tablet Take 1 tablet (40 mg total) by mouth daily. Patient taking differently: Take 40 mg by mouth daily as needed (acid reflux).  06/17/15  Yes Darlin Coco, MD  raloxifene (EVISTA) 60 MG tablet Take 60 mg by mouth daily.   Yes Historical Provider, MD  oxyCODONE-acetaminophen (ROXICET) 5-325 MG per tablet Take 1-2 tablets by mouth every 6 (six) hours as needed for severe pain. 04/02/15   Loni Dolly, PA-C   PHYSICAL EXAM: Filed Vitals:   06/25/15 0848 06/25/15 0930 06/25/15 1000 06/25/15 1015  BP: 184/89 150/86 158/78 149/99  Pulse: 68 68 64 69  Temp: 98 F  (36.7 C)     TempSrc: Oral     Resp: 15 19 20 18   Height: 5\' 4"  (1.626 m)     Weight: 63.504 kg (140 lb)     SpO2: 100% 100% 97% 98%    Wt Readings from Last 3 Encounters:  06/25/15 63.504 kg (140 lb)  04/02/15 63.504 kg (140 lb)  10/07/14 62.596 kg (138 lb)    General:  Pleasant white  female. Appears calm and comfortable Eyes: PERRL, normal lids, irises & conjunctiva ENT: grossly normal hearing, lips & tongue Neck: no LAD, no masses Cardiovascular: RRR, soft murmur. No LE edema.  Respiratory: Respirations even and unlabored. Normal respiratory effort. Lungs CTA bilaterally, no wheezes / rales .   Abdomen: soft, non-distended, non-tender, active bowel sounds. No obvious masses.  Skin: no rash seen on limited exam Musculoskeletal: grossly normal tone BUE/BLE Psychiatric: grossly normal mood and affect, speech fluent and appropriate Neurologic: grossly non-focal.          Labs on Admission:  Basic Metabolic Panel:  Recent Labs Lab 06/25/15 0840 06/25/15 0859  NA 140 141  K 3.9 3.8  CL 108 107  CO2 21*  --   GLUCOSE 119* 117*  BUN 18 22*  CREATININE 1.06* 0.90  CALCIUM 9.6  --    Liver Function Tests:  Recent Labs Lab 06/25/15 0840  AST 37  ALT 34  ALKPHOS 69  BILITOT 0.6  PROT 8.0  ALBUMIN 4.4   CBC:  Recent Labs Lab 06/25/15 0840 06/25/15 0859  WBC 5.9  --   NEUTROABS 3.1  --   HGB 14.6 16.0*  HCT 43.0 47.0*  MCV 97.1  --   PLT 175  --    Radiological Exams on Admission: Ct Head Wo Contrast  06/25/2015  CLINICAL DATA:  Dizziness.  Occipital headache. EXAM: CT HEAD WITHOUT CONTRAST TECHNIQUE: Contiguous axial images were obtained from the base of the skull through the vertex without intravenous contrast. COMPARISON:  None. FINDINGS: Centralized atrophy with commensurate ex vacuo dilatation of the ventricular system. Sulcal spaces are preserved, thus arguing against communicating hydrocephalus. Gray-white differentiation is maintained. No CT  evidence of acute large territory infarct. No intraparenchymal or extra-axial mass or hemorrhage. Normal configuration of the ventricles and basilar cisterns. No midline shift. Limited visualization of the paranasal sinuses and mastoid air cells is normal. No air-fluid levels. Regional soft tissues appear normal. No displaced calvarial fracture. 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 Electronically Signed   By: Sandi Mariscal M.D.   On: 06/25/2015 09:25    ASSESSMENT / PLAN   Stroke-like symptoms. Transient dizziness with ataxia early this am. Symptoms associated with a headache. Similar episode with fall three weeks ago without associated headache. Neurologic exam normal at present. Orthostatic vitals unremarkable. Etiology of symptoms unclear. Complex migraine? TIA?  Admit to observation -telemetry Neurology consult MRI / MRA head today  Headache. Resolving without treatment.    Dehydration Based upon elevated BUN/ Cr ratio.  Hgb in the past was 14 and now is 16 therefore signifying hemoconcentration.  Will start slow IVF and follow above.   Hypertension. Hx of HTN but was able to discontinue anti-hypertensives last year after losing 30 pounds. BP elevated in ED today 149/99 mmHg. Monitor closely but no intervention until CVA ruled out.   Hypothyroidism. Last TSH in January. Repeat TSH, free T4.      Consultants-  Neurology   Code Status:  Full  DVT Prophylaxis: Lovenox Family Communication: Patient alert, oriented and understands plan of care.   Disposition Plan: Discharge to home in 24-48 hours.    Time spent: 60 minutes Tye Savoy  NP [facility] Pager [phone]  I have examined the patient, reviewed the chart and discussed the case with Tye Savoy, NP. History not consistent with BPPV. Exam not consistent with Orthostatic hypotension despite having an elevated BUN/ Cr ratio and Hgb which appears to be hemoconcentrated.  Await MRI.  Debbe Odea, MD

## 2015-06-25 NOTE — ED Notes (Signed)
Hospitalist at bedside 

## 2015-06-25 NOTE — Progress Notes (Signed)
Admission H&P    Chief Complaint: Dizziness, ataxia and transient speech difficulty.  HPI: Brittany Washington is an 64 y.o. female with a history of hypertension, dyslipidemia, hypothyroidism and arthritis, presenting with new onset vertigo with ataxia as well as transient speech difficulty. Symptoms onset was at 3 AM today. She had a similar episode of dizziness and unsteady gait 3 weeks ago without speech difficulty. She still has a feeling of disequilibrium with movement. Speech abnormality is resolved. CT scan of her head showed no acute intracranial abnormality. CT angiogram was performed because of a family history of dementia intracranial aneurysm involving her sister. CT angiogram was unremarkable. NIH stroke score at the time of this evaluation was 0. She's been taking aspirin every other day, 81 mg.  LSN: 3 AM on 06/25/2015 tPA Given: No: Rapid improvement mRankin:  Past Medical History  Diagnosis Date  . Hypothyroidism   . Arthritis   . Benign hypertensive heart disease without heart failure   . Dyslipidemia     MILD  . Heart murmur, systolic     MECHNICAL  . Osteoporosis   . Hypertension     mild, lost weight, no meds now    Past Surgical History  Procedure Laterality Date  . Trigger finger release  01/09/2012    Procedure: RELEASE TRIGGER FINGER/A-1 PULLEY;  Surgeon: [name], MD;  Location: [facility];  Service: Orthopedics;  Laterality: Left;  . Lumbar diskectomy w/ dissection, left l4 -- l5  02-23-2004  . Re-do lumbar diskectomy , l4  - l5  05-03-2004  . Posterior lumbar fusion  05-16-2005    L4 -- L5  . Orif left distal radial fx w/ bone graft  01-14-2009  . Laparoscopic cholecystectomy  1995  . Vaginal hysterectomy  1993    W/ BILATERAL SALPINGOOPHORECTOMY  . Hammer toe surgery Left 07/14/2013    Procedure: HAMMER TOE CORRECTION LEFT SECOND TOE, AUSTIN/AKIN LEFT;  Surgeon: [name], DPM;  Location: [facility];   Service: Podiatry;  Laterality: Left;  . Colonoscopy    . Wrist fracture surgery      left  . Metatarsal osteotomy Left 04/02/2015    Procedure: LEFT FOOT METATARSAL OSTEOTOMY;  Surgeon: [name], MD;  Location: [facility];  Service: Orthopedics;  Laterality: Left;    Family History  Problem Relation Age of Onset  . Heart attack Father   . Colon cancer Father   . Heart attack Brother   . Ovarian cancer Mother   . Rectal cancer Neg Hx   . Stomach cancer Neg Hx   . Breast cancer Neg Hx    Social History:  reports that she has never smoked. She has never used smokeless tobacco. She reports that she drinks about 4.2 oz of alcohol per week. She reports that she does not use illicit drugs.  Allergies:  Allergies  Allergen Reactions  . Sulfa Antibiotics Other (See Comments)    Body aches   Medications: Patient's preadmission medications were reviewed by me.  ROS: History obtained from the patient  General ROS: negative for - chills, fatigue, fever, night sweats, weight gain or weight loss Psychological ROS: negative for - behavioral disorder, hallucinations, memory difficulties, mood swings or suicidal ideation Ophthalmic ROS: negative for - blurry vision, double vision, eye pain or loss of vision ENT ROS: negative for - epistaxis, nasal discharge, oral lesions, sore throat, tinnitus or vertigo Allergy and Immunology ROS: negative for - hives or itchy/watery  eyes Hematological and Lymphatic ROS: negative for - bleeding problems, bruising or swollen lymph nodes Endocrine ROS: negative for - galactorrhea, hair pattern changes, polydipsia/polyuria or temperature intolerance Respiratory ROS: negative for - cough, hemoptysis, shortness of breath or wheezing Cardiovascular ROS: negative for - chest pain, dyspnea on exertion, edema or irregular heartbeat Gastrointestinal ROS: negative for - abdominal pain, diarrhea, hematemesis, nausea/vomiting or stool  incontinence Genito-Urinary ROS: negative for - dysuria, hematuria, incontinence or urinary frequency/urgency Musculoskeletal ROS: negative for - joint swelling or muscular weakness Neurological ROS: as noted in HPI Dermatological ROS: negative for rash and skin lesion changes  Physical Examination: Blood pressure 142/83, pulse 74, temperature 98 F (36.7 C), temperature source Oral, resp. rate 16, height $RemoveBe'5\' 4"'[id number]$  (1.626 m), weight 63.504 kg (140 lb), SpO2 99 %.  HEENT-  Normocephalic, no lesions, without obvious abnormality.  Normal external eye and conjunctiva.  Normal TM's bilaterally.  Normal auditory canals and external ears. Normal external nose, mucus membranes and septum.  Normal pharynx. Neck supple with no masses, nodes, nodules or enlargement. Cardiovascular - RRR, soft murmur. No LE edema Lungs - chest clear, no wheezing, rales, normal symmetric air entry Abdomen - soft, non-tender; bowel sounds normal; no masses,  no organomegaly Extremities - no joint deformities, effusion, or inflammation, no edema and no skin discoloration  Neurologic Examination: Mental Status: Alert, oriented, thought content appropriate.  Speech fluent without evidence of aphasia. Able to follow commands without difficulty. Cranial Nerves: II-Visual fields were normal. III/IV/VI-Pupils were equal and reacted normally to light. Extraocular movements were full and conjugate.    V/VII-no facial numbness and no facial weakness. VIII-normal. X-normal speech and symmetrical palatal movement. XI: trapezius strength/neck flexion strength normal bilaterally XII-midline tongue extension with normal strength. Motor: 5/5 bilaterally with normal tone and bulk Sensory: Normal throughout. Deep Tendon Reflexes: 2+ and symmetric. Plantars: Mute bilaterally Cerebellar: Normal finger-to-nose testing. Carotid auscultation: NormalMental Status:   Results for orders placed or performed during the hospital encounter of  06/25/15 (from the past 48 hour(s))  Protime-INR     Status: None   Collection Time: 06/25/15  8:40 AM  Result Value Ref Range   Prothrombin Time 13.0 11.6 - 15.2 seconds   INR 0.96 0.00 - 1.49  APTT     Status: None   Collection Time: 06/25/15  8:40 AM  Result Value Ref Range   aPTT 30 24 - 37 seconds  CBC     Status: None   Collection Time: 06/25/15  8:40 AM  Result Value Ref Range   WBC 5.9 4.0 - 10.5 K/uL   RBC 4.43 3.87 - 5.11 MIL/uL   Hemoglobin 14.6 12.0 - 15.0 g/dL   HCT 43.0 36.0 - 46.0 %   MCV 97.1 78.0 - 100.0 fL   MCH 33.0 26.0 - 34.0 pg   MCHC 34.0 30.0 - 36.0 g/dL   RDW 12.0 11.5 - 15.5 %   Platelets 175 150 - 400 K/uL  Differential     Status: None   Collection Time: 06/25/15  8:40 AM  Result Value Ref Range   Neutrophils Relative % 54 %   Neutro Abs 3.1 1.7 - 7.7 K/uL   Lymphocytes Relative 38 %   Lymphs Abs 2.2 0.7 - 4.0 K/uL   Monocytes Relative 7 %   Monocytes Absolute 0.4 0.1 - 1.0 K/uL   Eosinophils Relative 1 %   Eosinophils Absolute 0.1 0.0 - 0.7 K/uL   Basophils Relative 0 %   Basophils Absolute 0.0 0.0 -  0.1 K/uL  Comprehensive metabolic panel     Status: Abnormal   Collection Time: 06/25/15  8:40 AM  Result Value Ref Range   Sodium 140 135 - 145 mmol/L   Potassium 3.9 3.5 - 5.1 mmol/L   Chloride 108 101 - 111 mmol/L   CO2 21 (L) 22 - 32 mmol/L   Glucose, Bld 119 (H) 65 - 99 mg/dL   BUN 18 6 - 20 mg/dL   Creatinine, Ser 3.45 (H) 0.44 - 1.00 mg/dL   Calcium 9.6 8.9 - 78.9 mg/dL   Total Protein 8.0 6.5 - 8.1 g/dL   Albumin 4.4 3.5 - 5.0 g/dL   AST 37 15 - 41 U/L   ALT 34 14 - 54 U/L   Alkaline Phosphatase 69 38 - 126 U/L   Total Bilirubin 0.6 0.3 - 1.2 mg/dL   GFR calc non Af Amer 55 (L) >60 mL/min   GFR calc Af Amer >60 >60 mL/min    Comment: (NOTE) The eGFR has been calculated using the CKD EPI equation. This calculation has not been validated in all clinical situations. eGFR's persistently <60 mL/min signify possible Chronic  Kidney Disease.    Anion gap 11 5 - 15  Ethanol     Status: None   Collection Time: 06/25/15  8:45 AM  Result Value Ref Range   Alcohol, Ethyl (B) <5 <5 mg/dL    Comment:        LOWEST DETECTABLE LIMIT FOR SERUM ALCOHOL IS 5 mg/dL FOR MEDICAL PURPOSES ONLY   I-stat troponin, ED (not at [facility], [facility])     Status: None   Collection Time: 06/25/15  8:57 AM  Result Value Ref Range   Troponin i, poc 0.01 0.00 - 0.08 ng/mL   Comment 3            Comment: Due to the release kinetics of cTnI, a negative result within the first hours of the onset of symptoms does not rule out myocardial infarction with certainty. If myocardial infarction is still suspected, repeat the test at appropriate intervals.   I-Stat Chem 8, ED  (not at [facility], [facility])     Status: Abnormal   Collection Time: 06/25/15  8:59 AM  Result Value Ref Range   Sodium 141 135 - 145 mmol/L   Potassium 3.8 3.5 - 5.1 mmol/L   Chloride 107 101 - 111 mmol/L   BUN 22 (H) 6 - 20 mg/dL   Creatinine, Ser 9.04 0.44 - 1.00 mg/dL   Glucose, Bld 179 (H) 65 - 99 mg/dL   Calcium, Ion 2.04 (L) 1.13 - 1.30 mmol/L   TCO2 20 0 - 100 mmol/L   Hemoglobin 16.0 (H) 12.0 - 15.0 g/dL   HCT 54.2 (H) 52.0 - 53.8 %  Urine rapid drug screen (hosp performed)not at [facility]     Status: Abnormal   Collection Time: 06/25/15  9:03 AM  Result Value Ref Range   Opiates POSITIVE (A) NONE DETECTED   Cocaine NONE DETECTED NONE DETECTED   Benzodiazepines POSITIVE (A) NONE DETECTED   Amphetamines NONE DETECTED NONE DETECTED   Tetrahydrocannabinol NONE DETECTED NONE DETECTED   Barbiturates NONE DETECTED NONE DETECTED    Comment:        DRUG SCREEN FOR MEDICAL PURPOSES ONLY.  IF CONFIRMATION IS NEEDED FOR ANY PURPOSE, NOTIFY LAB WITHIN 5 DAYS.        LOWEST DETECTABLE LIMITS FOR URINE DRUG SCREEN Drug Class       Cutoff (ng/mL) Amphetamine  1000 Barbiturate      200 Benzodiazepine   628 Tricyclics       366 Opiates          300 Cocaine           300 THC              50   Urinalysis, Routine w reflex microscopic (not at [facility])     Status: None   Collection Time: 06/25/15  9:03 AM  Result Value Ref Range   Color, Urine YELLOW YELLOW   APPearance CLEAR CLEAR   Specific Gravity, Urine 1.019 1.005 - 1.030   pH 5.5 5.0 - 8.0   Glucose, UA NEGATIVE NEGATIVE mg/dL   Hgb urine dipstick NEGATIVE NEGATIVE   Bilirubin Urine NEGATIVE NEGATIVE   Ketones, ur NEGATIVE NEGATIVE mg/dL   Protein, ur NEGATIVE NEGATIVE mg/dL   Urobilinogen, UA 0.2 0.0 - 1.0 mg/dL   Nitrite NEGATIVE NEGATIVE   Leukocytes, UA NEGATIVE NEGATIVE    Comment: MICROSCOPIC NOT DONE ON URINES WITH NEGATIVE PROTEIN, BLOOD, LEUKOCYTES, NITRITE, OR GLUCOSE <1000 mg/dL.   Ct Angio Head W/cm &/or Wo Cm  06/25/2015  CLINICAL DATA:  Expressive aphasia beginning yesterday. EXAM: CT ANGIOGRAPHY HEAD AND NECK TECHNIQUE: Multidetector CT imaging of the head and neck was performed using the standard protocol during bolus administration of intravenous contrast. Multiplanar CT image reconstructions and MIPs were obtained to evaluate the vascular anatomy. Carotid stenosis measurements (when applicable) are obtained utilizing NASCET criteria, using the distal internal carotid diameter as the denominator. CONTRAST:  62mL OMNIPAQUE IOHEXOL 350 MG/ML SOLN COMPARISON:  Head CT from earlier today FINDINGS: CTA NECK Aortic arch: No aneurysm or dissection.  Three vessel branching Right carotid system: Mild mainly calcified atherosclerotic plaque at the bifurcation. No stenosis, dissection, or evidence of vasculopathy. Left carotid system: No stenosis, dissection, or evidence of vasculopathy. Vertebral arteries:Left dominant. The subclavian arteries are widely patent. There is a small but convincing left V1 segment medial outpouching measuring 2.4 mm in vertical span and 1 mm in thickness. No other irregularity to suggest an acute dissection or vasculopathy. Skeleton: Usual degenerative disc disease in the  cervical spine. Other neck: No incidentally detected mass or adenopathy in the neck. CTA HEAD Anterior circulation: Intact circle-of-Willis other than the left P1 segment. No major vessel occlusion or flow limiting stenosis. Posterior circulation: Diminutive posterior circulation in the setting of fetal type circulation. Symmetric vertebral and basilar branching other than the terminus where there is hypoplastic/aplastic left P1 segment . Venous sinuses: Patent as permitted by contrast timing Anatomic variants: Hypoplastic left P1 segment Delayed phase: No abnormal parenchymal enhancement. IMPRESSION: 1. No acute arterial finding. 2. 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 No indication of acute dissection or fibromuscular dysplasia, is there history of remote cervical trauma? 3. Mild cervical carotid atherosclerosis without stenosis. Electronically Signed   By: Monte Fantasia M.D.   On: 06/25/2015 11:53   Dg Chest 2 View  06/25/2015  CLINICAL DATA:  Dizziness and fatigue EXAM: CHEST - 2 VIEW COMPARISON:  02/18/2010 FINDINGS: Cardiac shadow is mildly enlarged. The lungs are well aerated bilaterally. No focal infiltrate or sizable effusion is seen. No bony abnormality is noted. IMPRESSION: No acute abnormality noted. Electronically Signed   By: Inez Catalina M.D.   On: 06/25/2015 11:52   Ct Head Wo Contrast  06/25/2015  CLINICAL DATA:  Dizziness.  Occipital headache. EXAM: CT HEAD WITHOUT CONTRAST TECHNIQUE: Contiguous axial images were  obtained from the base of the skull through the vertex without intravenous contrast. COMPARISON:  None. FINDINGS: Centralized atrophy with commensurate ex vacuo dilatation of the ventricular system. Sulcal spaces are preserved, thus arguing against communicating hydrocephalus. Gray-white differentiation is maintained. No CT evidence of acute large territory infarct. No intraparenchymal or extra-axial mass or hemorrhage. Normal  configuration of the ventricles and basilar cisterns. No midline shift. Limited visualization of the paranasal sinuses and mastoid air cells is normal. No air-fluid levels. Regional soft tissues appear normal. No displaced calvarial fracture. 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 Electronically Signed   By: Sandi Mariscal M.D.   On: 06/25/2015 09:25   Ct Angio Neck W/cm &/or Wo/cm  06/25/2015  CLINICAL DATA:  Expressive aphasia beginning yesterday. EXAM: CT ANGIOGRAPHY HEAD AND NECK TECHNIQUE: Multidetector CT imaging of the head and neck was performed using the standard protocol during bolus administration of intravenous contrast. Multiplanar CT image reconstructions and MIPs were obtained to evaluate the vascular anatomy. Carotid stenosis measurements (when applicable) are obtained utilizing NASCET criteria, using the distal internal carotid diameter as the denominator. CONTRAST:  76mL OMNIPAQUE IOHEXOL 350 MG/ML SOLN COMPARISON:  Head CT from earlier today FINDINGS: CTA NECK Aortic arch: No aneurysm or dissection.  Three vessel branching Right carotid system: Mild mainly calcified atherosclerotic plaque at the bifurcation. No stenosis, dissection, or evidence of vasculopathy. Left carotid system: No stenosis, dissection, or evidence of vasculopathy. Vertebral arteries:Left dominant. The subclavian arteries are widely patent. There is a small but convincing left V1 segment medial outpouching measuring 2.4 mm in vertical span and 1 mm in thickness. No other irregularity to suggest an acute dissection or vasculopathy. Skeleton: Usual degenerative disc disease in the cervical spine. Other neck: No incidentally detected mass or adenopathy in the neck. CTA HEAD Anterior circulation: Intact circle-of-Willis other than the left P1 segment. No major vessel occlusion or flow limiting stenosis. Posterior circulation: Diminutive posterior circulation in the setting of fetal type circulation.  Symmetric vertebral and basilar branching other than the terminus where there is hypoplastic/aplastic left P1 segment . Venous sinuses: Patent as permitted by contrast timing Anatomic variants: Hypoplastic left P1 segment Delayed phase: No abnormal parenchymal enhancement. IMPRESSION: 1. No acute arterial finding. 2. Small outpouching from the left V1 segment, likely a tiny pseudoaneurysm (2x79mm). No indication of acute dissection or fibromuscular dysplasia, is there history of remote cervical trauma? 3. Mild cervical carotid atherosclerosis without stenosis. Electronically Signed   By: Monte Fantasia M.D.   On: 06/25/2015 11:53    Assessment: 64 y.o. female with multiple risk factors for stroke presenting with probable TIA with transient speech difficulty. Acute cerebral infarction cannot be ruled out at this point, however.  Stroke Risk Factors - hyperlipidemia and hypertension  Plan: 1. HgbA1c, fasting lipid panel 2. MRI of the brain without contrast 3. PT consult, OT consult, Speech consult 4. Echocardiogram 5. Prophylactic therapy-Antiplatelet med: Aspirin 6. Risk factor modification 7. Telemetry monitoring  C.R. Nicole Kindred, MD [facility] [phone]  06/25/2015, 12:32 PM

## 2015-06-25 NOTE — ED Notes (Signed)
Verbal order from MD that patient may eat/drink. Pt given [place] sandwich and sprite.

## 2015-06-26 ENCOUNTER — Encounter: Payer: Self-pay | Admitting: Neurology

## 2015-06-26 DIAGNOSIS — R2990 Unspecified symptoms and signs involving the nervous system: Secondary | ICD-10-CM | POA: Diagnosis not present

## 2015-06-26 DIAGNOSIS — E890 Postprocedural hypothyroidism: Secondary | ICD-10-CM

## 2015-06-26 DIAGNOSIS — I10 Essential (primary) hypertension: Secondary | ICD-10-CM | POA: Diagnosis not present

## 2015-06-26 LAB — LIPID PANEL
CHOL/HDL RATIO: 3 ratio
CHOLESTEROL: 186 mg/dL (ref 0–200)
HDL: 61 mg/dL (ref >40)
LDL Cholesterol: 72 mg/dL (ref 0–99)
Triglycerides: 264 mg/dL — ABNORMAL HIGH (ref <150)
VLDL: 53 mg/dL — AB (ref 0–40)

## 2015-06-26 MED ORDER — ASPIRIN 81 MG PO TABS: 81.0000 mg | ORAL_TABLET | Freq: Every day | ORAL | Status: DC

## 2015-06-26 NOTE — Progress Notes (Signed)
PT Cancellation Note  Patient Details Name: ABENI FINCHUM MRN: [id number] DOB: [birth year]   Cancelled Treatment:    Reason Eval/Treat Not Completed: PT screened, no needs identified, will sign off.  Pt reports her onset of symptoms occur after getting up quickly at night after lying down asleep, likely result of drop in BP.  Pt at Ind level for ambulation and sit<>stand transfers.  Instructed pt to contact her doctor if her symptoms do not resolve by sitting EOB for ~1 minute prior to standing, pt verbalized understanding.  No PT needs identified.  PT is signing off, thank you for this order.  Joslyn Hy PT, DPT [phone] Pager: [phone] 06/26/2015, 12:07 PM

## 2015-06-26 NOTE — Progress Notes (Signed)
OT Cancellation Note  Patient Details Name: JASMINEMARIE SHERRARD MRN: [id number] DOB: 12/22/1950   Cancelled Treatment:    Reason Eval/Treat Not Completed: OT screened, no needs identified, will sign off  Benito Mccreedy OTR/L [phone] 06/26/2015, 12:12 PM

## 2015-06-26 NOTE — Progress Notes (Signed)
STROKE TEAM PROGRESS NOTE   HISTORY Brittany Washington is a 64 y.o. female with a history of hypertension, dyslipidemia, hypothyroidism and arthritis, presenting with new onset vertigo with ataxia as well as transient speech difficulty associated with a headache. Symptoms onset was at 3 AM today. She had a similar episode of dizziness and unsteady gait 3 weeks ago without speech difficulty. She still has a feeling of disequilibrium with movement. Speech abnormality is resolved. CT scan of her head showed no acute intracranial abnormality. CT angiogram was performed because of a family history of dementia intracranial aneurysm involving her sister. CT angiogram was unremarkable. NIH stroke score at the time of this evaluation was 0. She had been taking aspirin 81 mg daily in the past but not recently.  LSN: 3 AM on 06/25/2015 tPA Given: No: Rapid improvement mRankin:   SUBJECTIVE (INTERVAL HISTORY) The patient feels she is back to baseline. She is anxious for discharge. I spoke with her husband by telephone.   OBJECTIVE Temp:  [97.8 F (36.6 C)-98.7 F (37.1 C)] 97.8 F (36.6 C) (10/22 1024) Pulse Rate:  [53-75] 53 (10/22 1024) Cardiac Rhythm:  [-] Sinus bradycardia (10/22 0705) Resp:  [12-20] 20 (10/22 1024) BP: (118-165)/(65-90) 149/80 mmHg (10/22 1024) SpO2:  [97 %-100 %] 100 % (10/22 1024)  CBC:  Recent Labs Lab 06/25/15 0840 06/25/15 0859  WBC 5.9  --   NEUTROABS 3.1  --   HGB 14.6 16.0*  HCT 43.0 47.0*  MCV 97.1  --   PLT 175  --     Basic Metabolic Panel:  Recent Labs Lab 06/25/15 0840 06/25/15 0859  NA 140 141  K 3.9 3.8  CL 108 107  CO2 21*  --   GLUCOSE 119* 117*  BUN 18 22*  CREATININE 1.06* 0.90  CALCIUM 9.6  --     Lipid Panel:    Component Value Date/Time   CHOL 186 06/26/2015 0230   TRIG 264* 06/26/2015 0230   HDL 61 06/26/2015 0230   CHOLHDL 3.0 06/26/2015 0230   VLDL 53* 06/26/2015 0230   LDLCALC 72 06/26/2015 0230   HgbA1c: No results  found for: HGBA1C Urine Drug Screen:    Component Value Date/Time   LABOPIA POSITIVE* 06/25/2015 0903   COCAINSCRNUR NONE DETECTED 06/25/2015 0903   LABBENZ POSITIVE* 06/25/2015 0903   AMPHETMU NONE DETECTED 06/25/2015 0903   THCU NONE DETECTED 06/25/2015 0903   LABBARB NONE DETECTED 06/25/2015 0903      IMAGING  Ct Angio Head and Neck W/cm &/or Wo Cm 06/25/2015   1. No acute arterial finding.  2. Small outpouching from the left V1 segment, likely a tiny pseudoaneurysm (2x87mm). No indication of acute dissection or fibromuscular dysplasia, is there history of remote cervical trauma? (The patient denied history of cervical trauma) 3. Mild cervical carotid atherosclerosis without stenosis.   Dg Chest 2 View 06/25/2015   No acute abnormality noted. Electronically Signed   By: Inez Catalina M.D.   On: 06/25/2015 11:52   Ct Head Wo Contrast 06/25/2015   Centralized atrophy without acute intracranial process.    Mr Brain Wo Contrast 06/25/2015   1. Mild atrophy and white matter disease likely reflects the sequela of chronic microvascular ischemia.  2. No acute intracranial abnormality or focal lesion to explain sudden onset of ataxia     PHYSICAL EXAM General - pleasant 64 year old female in no acute distress. Heart - Regular rate and rhythm - no murmer Lungs - Clear to auscultation Skin -  Warm and dry  Mental Status: Alert, oriented, thought content appropriate.  Speech fluent without evidence of aphasia.  Able to follow 3 step commands without difficulty. Cranial Nerves: II: Visual fields grossly normal, pupils equal, round, reactive to light and accommodation III,IV, VI: ptosis not present, extra-ocular motions intact bilaterally V,VII: smile symmetric, facial light touch sensation normal bilaterally VIII: hearing normal bilaterally IX,X: gag reflex present XI: bilateral shoulder shrug XII: midline tongue extension Motor: Right : Upper extremity   5/5    Left:      Upper extremity   5/5  Lower extremity   5/5     Lower extremity   5/5 Tone and bulk:normal tone throughout; no atrophy noted Sensory: Light touch intact throughout, bilaterally Cerebellar: normal finger-to-nose, Gait: not performed       ASSESSMENT/PLAN Ms. Brittany Washington is a 64 y.o. female with history of  hypertension, dyslipidemia, hypothyroidism and arthritis, presenting with new onset vertigo with ataxia as well as transient speech difficulty (patient reports one episode of word finding difficulty but she was very anxious at the time) associated with a headache.   She did not receive IV t-PA due to rapid improvement  TIA versus complicated migraine versus episodes of vertigo.  Resultant  resolution of deficits  MRI - Mild atrophy and white matter disease likely reflects the sequela of chronic microvascular ischemia. No infarct.  MRA - not performed  CTA of head and neck - Small outpouching from the left V1 segment, likely a tiny pseudoaneurysm (2x50mm).   Carotid Doppler Bilateral: 1-39% ICA stenosis. Vertebral artery flow is antegrade.   2D Echo - EF 60-65%. No cardiac source of emboli identified.  LDL - 72  HgbA1c pending  VTE prophylaxis - Lovenox  Diet Heart Room service appropriate?: Yes; Fluid consistency:: Thin  Diet - low sodium heart healthy  Intermittent aspirin 81 mg prior to admission, now on aspirin 81 mg daily  Patient counseled to be compliant with her antithrombotic medications  Ongoing aggressive stroke risk factor management  Therapy recommendations: No needs identified  Disposition: Possible discharge today  Hypertension  Stable   Hyperlipidemia  Home meds:  No lipid lowering medications prior to admission  LDL 72, goal < 70  Continue statin at discharge   Other Stroke Risk Factors  Advanced age  ETOH use   Follow-up with [facility] in 2 months.  Hospital day #   Mikey Bussing PA-C [facility] Pager [phone] 06/26/2015, 12:48 PM   Lenor Coffin      To contact Stroke Continuity provider, please refer to [email or web address] After hours, contact General Neurology

## 2015-06-26 NOTE — Progress Notes (Signed)
D/C orders received, pt for D/C home today.  IV and telemetry D/C.  Rx and D/C instructions given with verbalized understanding.  Family at bedside to assist with D/C.  Staff brought pt downstairs via wheelchair.  

## 2015-06-26 NOTE — Discharge Summary (Signed)
PATIENT DETAILS Name: Brittany Washington Age: 64 y.o. Sex: female Date of Birth: May 04, 1951 MRN: [id number]. Admitting Physician: Debbe Odea, MD DPO:[id number], Gershon Mussel, MD  Admit Date: 06/25/2015 Discharge date: 06/26/2015  Recommendations for Outpatient Follow-up:  1. A1c pending-please follow 2. Repeat lipid panel in 3 months 3. Ensure follow up with neurology  PRIMARY DISCHARGE DIAGNOSIS:  Principal Problem:   Stroke-like symptoms Active Problems:   Hypothyroid   Headache   Hypertension      PAST MEDICAL HISTORY: Past Medical History  Diagnosis Date  . Hypothyroidism   . Arthritis   . Benign hypertensive heart disease without heart failure   . Dyslipidemia     MILD  . Heart murmur, systolic     MECHNICAL  . Osteoporosis   . Hypertension     mild, lost weight, no meds now    DISCHARGE MEDICATIONS: Current Discharge Medication List    CONTINUE these medications which have CHANGED   Details  aspirin 81 MG tablet Take 1 tablet (81 mg total) by mouth daily.      CONTINUE these medications which have NOT CHANGED   Details  cetirizine (ZYRTEC) 10 MG tablet Take 10 mg by mouth daily.    clonazePAM (KLONOPIN) 1 MG tablet TAKE 1-2 TABLETS BY MOUTH EVERY NIGHT AT BEDTIME AS NEEDED Qty: 60 tablet, Refills: 5    HYDROcodone-acetaminophen (NORCO/VICODIN) 5-325 MG tablet Take 1 tablet by mouth every 6 (six) hours as needed for moderate pain (as needed for back pain).    ibuprofen (ADVIL,MOTRIN) 600 MG tablet TAKE 1 TABLET BY MOUTH 1-2 TIMES AS DAY AS NEEDED Qty: 135 tablet, Refills: 1   Associated Diagnoses: Primary osteoarthritis involving multiple joints    levothyroxine (SYNTHROID, LEVOTHROID) 112 MCG tablet TAKE 1 TABLET BY MOUTH ONCE DAILY Qty: 90 tablet, Refills: PRN    pantoprazole (PROTONIX) 40 MG tablet Take 1 tablet (40 mg total) by mouth daily. Qty: 90 tablet, Refills: 1    raloxifene (EVISTA) 60 MG tablet Take 60 mg by mouth daily.      STOP  taking these medications     oxyCODONE-acetaminophen (ROXICET) 5-325 MG per tablet         ALLERGIES:   Allergies  Allergen Reactions  . Sulfa Antibiotics Other (See Comments)    Body aches    BRIEF HPI:  See H&P, Labs, Consult and Test reports for all details in brief, patient is a 64 y.o. female with a past medical history of hypothyroidism, arthritis, osteoporosis and hypertension who presented with transient dizzness, headache, gait disturbance  CONSULTATIONS:   neurology  PERTINENT RADIOLOGIC STUDIES: Ct Angio Head W/cm &/or Wo Cm  06/25/2015  CLINICAL DATA:  Expressive aphasia beginning yesterday. EXAM: CT ANGIOGRAPHY HEAD AND NECK TECHNIQUE: Multidetector CT imaging of the head and neck was performed using the standard protocol during bolus administration of intravenous contrast. Multiplanar CT image reconstructions and MIPs were obtained to evaluate the vascular anatomy. Carotid stenosis measurements (when applicable) are obtained utilizing NASCET criteria, using the distal internal carotid diameter as the denominator. CONTRAST:  14mL OMNIPAQUE IOHEXOL 350 MG/ML SOLN COMPARISON:  Head CT from earlier today FINDINGS: CTA NECK Aortic arch: No aneurysm or dissection.  Three vessel branching Right carotid system: Mild mainly calcified atherosclerotic plaque at the bifurcation. No stenosis, dissection, or evidence of vasculopathy. Left carotid system: No stenosis, dissection, or evidence of vasculopathy. Vertebral arteries:Left dominant. The subclavian arteries are widely patent. There is a small but convincing left V1 segment medial outpouching measuring  2.4 mm in vertical span and 1 mm in thickness. No other irregularity to suggest an acute dissection or vasculopathy. Skeleton: Usual degenerative disc disease in the cervical spine. Other neck: No incidentally detected mass or adenopathy in the neck. CTA HEAD Anterior circulation: Intact circle-of-Willis other than the left P1 segment. No  major vessel occlusion or flow limiting stenosis. Posterior circulation: Diminutive posterior circulation in the setting of fetal type circulation. Symmetric vertebral and basilar branching other than the terminus where there is hypoplastic/aplastic left P1 segment . Venous sinuses: Patent as permitted by contrast timing Anatomic variants: Hypoplastic left P1 segment Delayed phase: No abnormal parenchymal enhancement. IMPRESSION: 1. No acute arterial finding. 2. 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 No indication of acute dissection or fibromuscular dysplasia, is there history of remote cervical trauma? 3. Mild cervical carotid atherosclerosis without stenosis. Electronically Signed   By: Monte Fantasia M.D.   On: 06/25/2015 11:53   Dg Chest 2 View  06/25/2015  CLINICAL DATA:  Dizziness and fatigue EXAM: CHEST - 2 VIEW COMPARISON:  02/18/2010 FINDINGS: Cardiac shadow is mildly enlarged. The lungs are well aerated bilaterally. No focal infiltrate or sizable effusion is seen. No bony abnormality is noted. IMPRESSION: No acute abnormality noted. Electronically Signed   By: Inez Catalina M.D.   On: 06/25/2015 11:52   Ct Head Wo Contrast  06/25/2015  CLINICAL DATA:  Dizziness.  Occipital headache. EXAM: CT HEAD WITHOUT CONTRAST TECHNIQUE: Contiguous axial images were obtained from the base of the skull through the vertex without intravenous contrast. COMPARISON:  None. FINDINGS: Centralized atrophy with commensurate ex vacuo dilatation of the ventricular system. Sulcal spaces are preserved, thus arguing against communicating hydrocephalus. Gray-white differentiation is maintained. No CT evidence of acute large territory infarct. No intraparenchymal or extra-axial mass or hemorrhage. Normal configuration of the ventricles and basilar cisterns. No midline shift. Limited visualization of the paranasal sinuses and mastoid air cells is normal. No air-fluid levels. Regional  soft tissues appear normal. No displaced calvarial fracture. 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 Electronically Signed   By: Sandi Mariscal M.D.   On: 06/25/2015 09:25   Ct Angio Neck W/cm &/or Wo/cm  06/25/2015  CLINICAL DATA:  Expressive aphasia beginning yesterday. EXAM: CT ANGIOGRAPHY HEAD AND NECK TECHNIQUE: Multidetector CT imaging of the head and neck was performed using the standard protocol during bolus administration of intravenous contrast. Multiplanar CT image reconstructions and MIPs were obtained to evaluate the vascular anatomy. Carotid stenosis measurements (when applicable) are obtained utilizing NASCET criteria, using the distal internal carotid diameter as the denominator. CONTRAST:  66mL OMNIPAQUE IOHEXOL 350 MG/ML SOLN COMPARISON:  Head CT from earlier today FINDINGS: CTA NECK Aortic arch: No aneurysm or dissection.  Three vessel branching Right carotid system: Mild mainly calcified atherosclerotic plaque at the bifurcation. No stenosis, dissection, or evidence of vasculopathy. Left carotid system: No stenosis, dissection, or evidence of vasculopathy. Vertebral arteries:Left dominant. The subclavian arteries are widely patent. There is a small but convincing left V1 segment medial outpouching measuring 2.4 mm in vertical span and 1 mm in thickness. No other irregularity to suggest an acute dissection or vasculopathy. Skeleton: Usual degenerative disc disease in the cervical spine. Other neck: No incidentally detected mass or adenopathy in the neck. CTA HEAD Anterior circulation: Intact circle-of-Willis other than the left P1 segment. No major vessel occlusion or flow limiting stenosis. Posterior circulation: Diminutive posterior circulation in the setting of fetal type circulation. Symmetric vertebral and  basilar branching other than the terminus where there is hypoplastic/aplastic left P1 segment . Venous sinuses: Patent as permitted by contrast timing Anatomic  variants: Hypoplastic left P1 segment Delayed phase: No abnormal parenchymal enhancement. IMPRESSION: 1. No acute arterial finding. 2. Small outpouching from the left V1 segment, likely a tiny pseudoaneurysm (2x31mm). No indication of acute dissection or fibromuscular dysplasia, is there history of remote cervical trauma? 3. Mild cervical carotid atherosclerosis without stenosis. Electronically Signed   By: Monte Fantasia M.D.   On: 06/25/2015 11:53   Mr Brain Wo Contrast  06/25/2015  CLINICAL DATA:  Ataxia. New onset vertigo. Abnormal CTA of the neck. EXAM: MRI HEAD WITHOUT CONTRAST TECHNIQUE: Multiplanar, multiecho pulse sequences of the brain and surrounding structures were obtained without intravenous contrast. COMPARISON:  CT of the head and neck 06/25/2015. FINDINGS: The diffusion-weighted images demonstrate no evidence for acute or subacute infarction. Mild periventricular and subcortical T2 changes bilaterally are somewhat greater than expected for age. The ventricles are proportionate to the degree of atrophy. No significant extra-axial fluid collection is present. Flow is present in the major intracranial arteries. The internal auditory canals are within normal limits. The brainstem is unremarkable. The globes and orbits are intact. The paranasal sinuses and mastoid air cells are clear. The skullbase is within normal limits. Midline structures are unremarkable. IMPRESSION: 1. Mild atrophy and white matter disease likely reflects the sequela of chronic microvascular ischemia. 2. No acute intracranial abnormality or focal lesion to explain sudden onset of ataxia Electronically Signed   By: San Morelle M.D.   On: 06/25/2015 14:46     PERTINENT LAB RESULTS: CBC:  Recent Labs  06/25/15 0840 06/25/15 0859  WBC 5.9  --   HGB 14.6 16.0*  HCT 43.0 47.0*  PLT 175  --    CMET CMP     Component Value Date/Time   NA 141 06/25/2015 0859   K 3.8 06/25/2015 0859   CL 107 06/25/2015 0859     CO2 21* 06/25/2015 0840   GLUCOSE 117* 06/25/2015 0859   BUN 22* 06/25/2015 0859   CREATININE 0.90 06/25/2015 0859   CALCIUM 9.6 06/25/2015 0840   PROT 8.0 06/25/2015 0840   ALBUMIN 4.4 06/25/2015 0840   AST 37 06/25/2015 0840   ALT 34 06/25/2015 0840   ALKPHOS 69 06/25/2015 0840   BILITOT 0.6 06/25/2015 0840   GFRNONAA 55* 06/25/2015 0840   GFRAA >60 06/25/2015 0840    GFR Estimated Creatinine Clearance: 56 mL/min (by C-G formula based on Cr of 0.9). No results for input(s): LIPASE, AMYLASE in the last 72 hours. No results for input(s): CKTOTAL, CKMB, CKMBINDEX, TROPONINI in the last 72 hours. Invalid input(s): POCBNP No results for input(s): DDIMER in the last 72 hours. No results for input(s): HGBA1C in the last 72 hours.  Recent Labs  06/26/15 0230  CHOL 186  HDL 61  LDLCALC 72  TRIG 264*  CHOLHDL 3.0   No results for input(s): TSH, T4TOTAL, T3FREE, THYROIDAB in the last 72 hours.  Invalid input(s): FREET3 No results for input(s): VITAMINB12, FOLATE, FERRITIN, TIBC, IRON, RETICCTPCT in the last 72 hours. Coags:  Recent Labs  06/25/15 0840  INR 0.96   Microbiology: No results found for this or any previous visit (from the past 240 hour(s)).   BRIEF HOSPITAL COURSE:   TIA versus atypical migraine: Presented with transient headache, ataxic gait, speech difficulty. Symptoms have all resolved. CTA neck and head negative for any significant stenosis. MRI brain negative for acute  CVA. Carotid Doppler negative for significant stenosis. Not sure if symptoms are related to vertebrobasilar ischemia or atypical migraine (no prior history of headaches in the past). Irrespective of etiology-symptoms have resolved-Will discharge on aspirin 81 mg a daily and outpatient neurology follow-up.  Elevated blood pressure without the diagnosis of hypertension: Had elevated blood pressure on admission, blood pressure currently very well controlled without the use of any  antihypertensives. Hold off on starting antihypertensives-reassess at next visit with PCP   Hypothyroidism: Continue levothyroxine  GERD: PPI  Small outpouching from the left V1 segment, likely a tiny pseudoaneurysm (2x64mm): Seen on CT angiogram of the head-Stable for outpatient follow-up   TODAY-DAY OF DISCHARGE:  Subjective:   Cerinity Zynda today has no headache,no chest abdominal pain,no new weakness tingling or numbness, feels much better wants to go home today.   Objective:   Blood pressure 149/80, pulse 53, temperature 97.8 F (36.6 C), temperature source Oral, resp. rate 20, height 5\' 4"  (1.626 m), weight 63.504 kg (140 lb), SpO2 100 %.  Intake/Output Summary (Last 24 hours) at 06/26/15 1122 Last data filed at 06/25/15 1900  Gross per 24 hour  Intake  697.5 ml  Output      0 ml  Net  697.5 ml   Filed Weights   06/25/15 0848  Weight: 63.504 kg (140 lb)    Exam Awake Alert, Oriented *3, No new F.N deficits, Normal affect [place].AT,PERRAL Supple Neck,No JVD, No cervical lymphadenopathy appriciated.  Symmetrical Chest wall movement, Good air movement bilaterally, CTAB RRR,No Gallops,Rubs or new Murmurs, No Parasternal Heave +ve B.Sounds, Abd Soft, Non tender, No organomegaly appriciated, No rebound -guarding or rigidity. No Cyanosis, Clubbing or edema, No new Rash or bruise  DISCHARGE CONDITION: Stable  DISPOSITION: Home  DISCHARGE INSTRUCTIONS:    Activity:  As tolerated   Get Medicines reviewed and adjusted: Please take all your medications with you for your next visit with your Primary MD  Please request your Primary MD to go over all hospital tests and procedure/radiological results at the follow up, please ask your Primary MD to get all Hospital records sent to his/her office.  If you experience worsening of your admission symptoms, develop shortness of breath, life threatening emergency, suicidal or homicidal thoughts you must seek medical attention  immediately by calling 911 or calling your MD immediately  if symptoms less severe.  You must read complete instructions/literature along with all the possible adverse reactions/side effects for all the Medicines you take and that have been prescribed to you. Take any new Medicines after you have completely understood and accpet all the possible adverse reactions/side effects.   Do not drive when taking Pain medications.   Do not take more than prescribed Pain, Sleep and Anxiety Medications  Special Instructions: If you have smoked or chewed Tobacco  in the last 2 yrs please stop smoking, stop any regular Alcohol  and or any Recreational drug use.  Wear Seat belts while driving.  Please note  You were cared for by a hospitalist during your hospital stay. Once you are discharged, your primary care physician will handle any further medical issues. Please note that NO REFILLS for any discharge medications will be authorized once you are discharged, as it is imperative that you return to your primary care physician (or establish a relationship with a primary care physician if you do not have one) for your aftercare needs so that they can reassess your need for medications and monitor your lab values.  Diet recommendation: Heart Healthy diet  Discharge Instructions    Ambulatory referral to Neurology    Complete by:  As directed   An appointment is requested in approximately: 4 weeks     Call MD for:  extreme fatigue    Complete by:  As directed      Call MD for:  persistant dizziness or light-headedness    Complete by:  As directed      Diet - low sodium heart healthy    Complete by:  As directed      Increase activity slowly    Complete by:  As directed            Follow-up Information    Follow up with Warren Danes, MD. Schedule an appointment as soon as possible for a visit in 2 weeks.   Specialty:  Cardiology   Contact information:   [address] [place] [address] [phone]       Follow up with Lenor Coffin, MD.   Specialty:  Neurology   Why:  Office will call you for a follow-up appointment-if you do not hear from them-please call them in the next few days   Contact information:   [address] [place] [address] [phone]       Total Time spent on discharge equals 25 minutes.  SignedOren Binet 06/26/2015 11:22 AM

## 2015-06-28 LAB — HEMOGLOBIN A1C
Hgb A1c MFr Bld: 5.3 % (ref 4.8–5.6)
MEAN PLASMA GLUCOSE: 105 mg/dL

## 2015-06-30 ENCOUNTER — Other Ambulatory Visit: Payer: Self-pay | Admitting: Cardiology

## 2015-06-30 NOTE — Telephone Encounter (Signed)
Should the patient still be taking this medication? Is is not on her current med list. If she is to be taking, should it still be 12.5mg  qod? Please advise. Thanks, MI

## 2015-07-01 NOTE — Telephone Encounter (Signed)
Okay to refill HCTZ

## 2015-07-22 ENCOUNTER — Ambulatory Visit (INDEPENDENT_AMBULATORY_CARE_PROVIDER_SITE_OTHER): Payer: 59 | Admitting: Cardiology

## 2015-07-22 ENCOUNTER — Encounter: Payer: Self-pay | Admitting: Cardiology

## 2015-07-22 ENCOUNTER — Other Ambulatory Visit: Payer: 59

## 2015-07-22 VITALS — BP 130/80 | HR 71 | Ht 64.0 in | Wt 146.8 lb

## 2015-07-22 DIAGNOSIS — R011 Cardiac murmur, unspecified: Secondary | ICD-10-CM

## 2015-07-22 DIAGNOSIS — E039 Hypothyroidism, unspecified: Secondary | ICD-10-CM | POA: Diagnosis not present

## 2015-07-22 DIAGNOSIS — I119 Hypertensive heart disease without heart failure: Secondary | ICD-10-CM | POA: Diagnosis not present

## 2015-07-22 LAB — TSH: TSH: 0.483 u[IU]/mL (ref 0.350–4.500)

## 2015-07-22 LAB — T4, FREE: FREE T4: 1.19 ng/dL (ref 0.80–1.80)

## 2015-07-22 NOTE — Progress Notes (Signed)
Cardiology Office Note   Date:  07/22/2015   ID:  Brittany Washington, DOB [birth year], MRN [id number]  PCP:  Warren Danes, MD  Cardiologist: Darlin Coco MD  Chief Complaint  Patient presents with  . Hypertension      History of Present Illness: Brittany Washington is a 64 y.o. female who presents for  Scheduled follow-up visit  This pleasant 64 year old Caucasian female is seen for a scheduled 6 month followup office visit. 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 She also has a history of known heart murmur and a past history of abnormal liver function studies. She has a history of mild dyslipidemia but is not on statins because of her abnormal liver function studies. In the past her transaminases have been elevated significantly and have improved after she cut back on wine and on nonsteroidal anti-inflammatories such as ibuprofen.  She is no longer on her strict diet. Her weight is up 8 pounds since last visit.  She went off her diet because her hair started to fall out.. 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  Since last visit she has been feeling well. She's had no chest pain or shortness of breath. She tries to ride her exercise bike for an hour a day. She has not been having a recent back pain. Since we last saw her she had normal colonoscopy with Dr. Henrene Pastor  the patient was admitted on 06/25/15 with a severe headache and with ataxia. At first it was felt that she was having a stroke.  However the final diagnosis after multiple normal tests was that she had a complex migraine.  She is being followed up with Dr. Jannifer Franklin.  Her echocardiogram on 06/25/15 showed ejection fraction of 60-65% with mild mitral regurgitation.  Her carotid duplex was normal.  Past Medical History  Diagnosis Date  . Hypothyroidism   . Arthritis   . Benign hypertensive heart  disease without heart failure   . Dyslipidemia     MILD  . Heart murmur, systolic     MECHNICAL  . Osteoporosis   . Hypertension     mild, lost weight, no meds now    Past Surgical History  Procedure Laterality Date  . Trigger finger release  01/09/2012    Procedure: RELEASE TRIGGER FINGER/A-1 PULLEY;  Surgeon: [name], MD;  Location: [facility];  Service: Orthopedics;  Laterality: Left;  . Lumbar diskectomy w/ dissection, left l4 -- l5  02-23-2004  . Re-do lumbar diskectomy , l4  - l5  05-03-2004  . Posterior lumbar fusion  05-16-2005    L4 -- L5  . Orif left distal radial fx w/ bone graft  01-14-2009  . Laparoscopic cholecystectomy  1995  . Vaginal hysterectomy  1993    W/ BILATERAL SALPINGOOPHORECTOMY  . Hammer toe surgery Left 07/14/2013    Procedure: HAMMER TOE CORRECTION LEFT SECOND TOE, AUSTIN/AKIN LEFT;  Surgeon: [name], DPM;  Location: [facility];  Service: Podiatry;  Laterality: Left;  . Colonoscopy    . Wrist fracture surgery      left  . Metatarsal osteotomy Left 04/02/2015    Procedure: LEFT FOOT METATARSAL OSTEOTOMY;  Surgeon: [name], MD;  Location: [facility];  Service: Orthopedics;  Laterality: Left;     Current Outpatient Prescriptions  Medication Sig Dispense Refill  . aspirin  81 MG tablet Take 1 tablet (81 mg total) by mouth daily.    . cetirizine (ZYRTEC) 10 MG tablet Take 10 mg by mouth daily.    . clonazePAM (KLONOPIN) 1 MG tablet Take by mouth at bedtime. Take 1-2 tablets by mouth daily at bedtime as needed for sleep.    . hydrochlorothiazide (MICROZIDE) 12.5 MG capsule Take 1 capsule (12.5 mg total) by mouth daily. 30 capsule 2  . HYDROcodone-acetaminophen (NORCO/VICODIN) 5-325 MG tablet Take 1 tablet by mouth every 6 (six) hours as needed for moderate pain (as needed for back pain).    Marland Kitchen ibuprofen (ADVIL,MOTRIN) 600 MG tablet Take 600 mg by mouth 2 (two) times daily as needed  (pain).    Marland Kitchen levothyroxine (SYNTHROID, LEVOTHROID) 112 MCG tablet TAKE 1 TABLET BY MOUTH ONCE DAILY 90 tablet PRN  . pantoprazole (PROTONIX) 40 MG tablet Take 40 mg by mouth daily as needed (acid reflux).    . raloxifene (EVISTA) 60 MG tablet Take 60 mg by mouth daily.     No current facility-administered medications for this visit.    Allergies:   Sulfa antibiotics    Social History:  The patient  reports that she has never smoked. She has never used smokeless tobacco. She reports that she drinks about 4.2 oz of alcohol per week. She reports that she does not use illicit drugs.   Family History:  The patient's family history includes Colon cancer in her father; Heart attack in her brother and father; Ovarian cancer in her mother. There is no history of Rectal cancer, Stomach cancer, or Breast cancer.    ROS:  Please see the history of present illness.   Otherwise, review of systems are positive for none.   All other systems are reviewed and negative.    PHYSICAL EXAM: VS:  BP 130/80 mmHg  Pulse 71  Ht 5\' 4"  (1.626 m)  Wt 146 lb 12.8 oz (66.588 kg)  BMI 25.19 kg/m2 , BMI Body mass index is 25.19 kg/(m^2). GEN: Well nourished, well developed, in no acute distress HEENT: normal Neck: no JVD, carotid bruits, or masses Cardiac: RRR; no murmurs, rubs, or gallops,no edema  Respiratory:  clear to auscultation bilaterally, normal work of breathing GI: soft, nontender, nondistended, + BS MS: no deformity or atrophy Skin: warm and dry, no rash Neuro:  Strength and sensation are intact Psych: euthymic mood, full affect   EKG:  EKG is not ordered today.    Recent Labs: 10/02/2014: TSH 0.52 06/25/2015: ALT 34; BUN 22*; Creatinine, Ser 0.90; Hemoglobin 16.0*; Platelets 175; Potassium 3.8; Sodium 141    Lipid Panel    Component Value Date/Time   CHOL 186 06/26/2015 0230   TRIG 264* 06/26/2015 0230   HDL 61 06/26/2015 0230   CHOLHDL 3.0 06/26/2015 0230   VLDL 53* 06/26/2015 0230     LDLCALC 72 06/26/2015 0230   LDLDIRECT 78.9 01/21/2013 0822      Wt Readings from Last 3 Encounters:  07/22/15 146 lb 12.8 oz (66.588 kg)  06/25/15 140 lb (63.504 kg)  04/02/15 140 lb (63.504 kg)        ASSESSMENT AND PLAN:  1. benign hypertensive heart disease without heart failure. 2. Hypercholesterolemia, significantly improved since she has lost weight. She is not on statin therapy. 3. heart murmur,  Recent echo shows mild mitral regurgitation. 4. severe seasonal allergies quiescent at present 5. past history of abnormal liver function studies, possibly fatty liver, improved with weight loss and stopping his statin therapy  6. Hypothyroidism, presently euthyroid on medication 7. Recent overnight hospitalization in October 2016 for complex migraine, now followed up by Dr. Jannifer Franklin   Current medicines are reviewed at length with the patient today.  The patient does not have concerns regarding medicines.  The following changes have been made:  no change  Labs/ tests ordered today include:   Orders Placed This Encounter  Procedures  . TSH  . T4, free     Disposition:    Continue current medication.  We are checking her thyroid functions today. She'll return in 6 months for follow-up office visit with Dr. Acie Fredrickson for office visit lipid panel hepatic function panel basal metabolic panel free T4 and TSH  Signed, Darlin Coco MD 07/22/2015 10:15 AM    [address], [place], [place]  [address] Phone: [phone]; Fax: [phone]

## 2015-07-22 NOTE — Patient Instructions (Signed)
Medication Instructions:  Your physician recommends that you continue on your current medications as directed. Please refer to the Current Medication list given to you today.  Labwork: TSH/FT4   Testing/Procedures: NONE  Follow-Up: Your physician wants you to follow-up in: 6 months with fasting labs (lp/bmet/hfp/TSH/FT4) WITH DR Acie Fredrickson  You will receive a reminder letter in the mail two months in advance. If you don't receive a letter, please call our office to schedule the follow-up appointment.  If you need a refill on your cardiac medications before your next appointment, please call your pharmacy.

## 2015-07-22 NOTE — Progress Notes (Signed)
Quick Note:  Please report to patient. The recent labs are stable. Continue same medication and careful diet. Thyroid levels are satisfactory. ______

## 2015-08-09 ENCOUNTER — Ambulatory Visit (INDEPENDENT_AMBULATORY_CARE_PROVIDER_SITE_OTHER): Payer: 59 | Admitting: Neurology

## 2015-08-09 ENCOUNTER — Encounter: Payer: Self-pay | Admitting: Neurology

## 2015-08-09 VITALS — BP 147/89 | HR 62 | Ht 64.0 in | Wt 147.5 lb

## 2015-08-09 DIAGNOSIS — R2990 Unspecified symptoms and signs involving the nervous system: Secondary | ICD-10-CM | POA: Diagnosis not present

## 2015-08-09 NOTE — Progress Notes (Signed)
Reason for visit: TIA  Brittany Washington is an 64 y.o. female  History of present illness:  Brittany Washington is a 64 year old right-handed female with a history of an admission to the hospital on 06/26/2015. The patient had an episode of transient aphasia. The patient had an episode prior to the hospitalization that was more brief, unassociated with headache. The second event was more prolonged, and was associated with headache. The patient does not have a history of migraine. MRI brain evaluation did not show any acute stroke, CT angiogram of the head and neck was relatively unremarkable. The patient at this time feels normal. She is on low-dose aspirin therapy. She has not had any recurrence of symptoms. She is back to work at this time. She has not had any further headache events, and no visual changes.  Past Medical History  Diagnosis Date  . Hypothyroidism   . Arthritis   . Benign hypertensive heart disease without heart failure   . Dyslipidemia     MILD  . Heart murmur, systolic     MECHNICAL  . Osteoporosis   . Hypertension     mild, lost weight, no meds now    Past Surgical History  Procedure Laterality Date  . Trigger finger release  01/09/2012    Procedure: RELEASE TRIGGER FINGER/A-1 PULLEY;  Surgeon: [name], MD;  Location: [place];  Service: Orthopedics;  Laterality: Left;  . Lumbar diskectomy w/ dissection, left l4 -- l5  02-23-2004  . Re-do lumbar diskectomy , l4  - l5  05-03-2004  . Posterior lumbar fusion  05-16-2005    L4 -- L5  . Orif left distal radial fx w/ bone graft  01-14-2009  . Laparoscopic cholecystectomy  1995  . Vaginal hysterectomy  1993    W/ BILATERAL SALPINGOOPHORECTOMY  . Hammer toe surgery Left 07/14/2013    Procedure: HAMMER TOE CORRECTION LEFT SECOND TOE, AUSTIN/AKIN LEFT;  Surgeon: [name], DPM;  Location: [place];  Service: Podiatry;  Laterality: Left;  . Colonoscopy    . Wrist  fracture surgery      left  . Metatarsal osteotomy Left 04/02/2015    Procedure: LEFT FOOT METATARSAL OSTEOTOMY;  Surgeon: [name], MD;  Location: [place];  Service: Orthopedics;  Laterality: Left;    Family History  Problem Relation Age of Onset  . Heart attack Father   . Colon cancer Father   . Heart attack Brother   . Ovarian cancer Mother   . Rectal cancer Neg Hx   . Stomach cancer Neg Hx   . Breast cancer Neg Hx     Social history:  reports that she has never smoked. She has never used smokeless tobacco. She reports that she drinks about 4.2 oz of alcohol per week. She reports that she does not use illicit drugs.    Allergies  Allergen Reactions  . Sulfa Antibiotics Other (See Comments)    Body aches    Medications:  Prior to Admission medications   Medication Sig Start Date End Date Taking? Authorizing Provider  aspirin 81 MG tablet Take 1 tablet (81 mg total) by mouth daily. 06/26/15  Yes Shanker Kristeen Mans, MD  cetirizine (ZYRTEC) 10 MG tablet Take 10 mg by mouth daily.   Yes Historical Provider, MD  clonazePAM (KLONOPIN) 1 MG tablet Take by mouth at bedtime. Take 1-2 tablets by mouth daily at bedtime as needed for sleep.   Yes Historical Provider,  MD  hydrochlorothiazide (MICROZIDE) 12.5 MG capsule Take 1 capsule (12.5 mg total) by mouth daily. 07/02/15  Yes Darlin Coco, MD  HYDROcodone-acetaminophen (NORCO/VICODIN) 5-325 MG tablet Take 1 tablet by mouth every 6 (six) hours as needed for moderate pain (as needed for back pain).   Yes Historical Provider, MD  ibuprofen (ADVIL,MOTRIN) 600 MG tablet Take 600 mg by mouth 2 (two) times daily as needed (pain).   Yes Historical Provider, MD  levothyroxine (SYNTHROID, LEVOTHROID) 112 MCG tablet TAKE 1 TABLET BY MOUTH ONCE DAILY 10/19/14  Yes Lendon Colonel, NP  pantoprazole (PROTONIX) 40 MG tablet Take 40 mg by mouth daily as needed (acid reflux).   Yes Historical Provider, MD  raloxifene (EVISTA)  60 MG tablet Take 60 mg by mouth daily.   Yes Historical Provider, MD    ROS:  Out of a complete 14 system review of symptoms, the patient complains only of the following symptoms, and all other reviewed systems are negative.  History of transient aphasia  Blood pressure 147/89, pulse 62, height 5\' 4"  (1.626 m), weight 147 lb 8 oz (66.906 kg).  Physical Exam  General: The patient is alert and cooperative at the time of the examination.  Skin: No significant peripheral edema is noted.   Neurologic Exam  Mental status: The patient is alert and oriented x 3 at the time of the examination. The patient has apparent normal recent and remote memory, with an apparently normal attention span and concentration ability.   Cranial nerves: Facial symmetry is present. Speech is normal, no aphasia or dysarthria is noted. Extraocular movements are full. Visual fields are full.  Motor: The patient has good strength in all 4 extremities.  Sensory examination: Soft touch sensation is symmetric on the face, arms, and legs.  Coordination: The patient has good finger-nose-finger and heel-to-shin bilaterally.  Gait and station: The patient has a normal gait. Tandem gait is normal. Romberg is negative. No drift is seen.  Reflexes: Deep tendon reflexes are symmetric.   MRI brain 06/26/15:  IMPRESSION: 1. Mild atrophy and white matter disease likely reflects the sequela of chronic microvascular ischemia. 2. No acute intracranial abnormality or focal lesion to explain sudden onset of ataxia  * MRI scan images were reviewed online. I agree with the written report.    Assessment/Plan:  1. Transient aphasia, headache  The etiology of the event is not clear, but could represent a migrainous episode. The patient will remain on aspirin, she will contact our office if symptoms recur. The episode was also associated some ataxia. She will follow-up through this office on an as-needed basis.  Jill Alexanders MD 08/09/2015 9:15 PM  [facility] [address] [place], [place] [address]  Phone [phone] Fax [phone]

## 2015-08-09 NOTE — Patient Instructions (Signed)
Aphasia Aphasia is damage to the part of your brain that you need to communicate. For most people, that area is on the left side of the brain. Aphasia does not affect your intelligence, but you may struggle to talk, understand speech, read, or write. Aphasia can happen to anyone at any age, but it is most common in older age. CAUSES  An interruption of blood supply to the brain (stroke) is the most common cause of aphasia. Any disease or disorder that damages the communication areas of the brain can cause aphasia. This includes:   Brain tumors.  Brain injuries.  Brain infections.  Progressive diseases of the nervous system (neurological disorders). RISK FACTORS You may be at risk for aphasia if you have had any trauma, disease, or disorder that damaged the communication areas of the brain. SIGNS AND SYMPTOMS  Aphasia may start suddenly if it is caused by a stroke or brain injury. Aphasia caused by a tumor or a progressive neurological disorder may start gradually. The condition affects people differently. Signs and symptoms of aphasia include:  Trouble finding the right word.  Using the wrong words.  Talking in sentences that do not make sense.  Making up words.  Being unable to understand other people's speech.  Having problems writing, spelling, or reading.  Having trouble with numbers.  Having trouble swallowing. DIAGNOSIS  Your health care provider may suspect you have aphasia if you lose the ability to speak or understand language. You may need to see a specialist (speech and language pathologist) to help determine the diagnosis of aphasia. This person may do a series of tests to check your ability to:  Speak.  Express ideas.  Make conversation.  Understand speech.  Read and write. TREATMENT  In some cases, aphasia may improve on its own over time. Treatment for aphasia usually involves therapy with a pathologist. Your treatment will be designed to meet your needs  and abilities. Common treatments include:  Speech therapy.  Learning other ways to communicate.  Working with family members to find the best ways to communicate.  Working with an occupational therapist to find ways to communicate at work. HOME CARE INSTRUCTIONS  Keep all follow-up appointments.  Make sure you have a good support system at home.  The following techniques may be helpful while communicating:  Use short, simple sentences. Ask family members to do the same. Sentences that require one-word answers are easiest.  Avoid distractions like background noise when trying to listen or talk.  Try communicating with gestures, pointing, or drawing.  Talk slowly. Ask family members to talk to you slowly.  Maintain eye contact when communicating. SEEK MEDICAL CARE IF:  Your symptoms change or get worse.  You need more support at home.  You are struggling with anxiety or depression.  You develop trouble swallowing.   This information is not intended to replace advice given to you by your health care provider. Make sure you discuss any questions you have with your health care provider.   Document Released: 05/13/2002 Document Revised: 09/11/2014 Document Reviewed: 11/10/2013 Elsevier Interactive Patient Education [employer].

## 2015-08-26 ENCOUNTER — Telehealth: Payer: Self-pay | Admitting: Cardiology

## 2015-08-26 MED ORDER — HYDROCOD POLST-CPM POLST ER 10-8 MG/5ML PO SUER: 5.0000 mL | Freq: Two times a day (BID) | ORAL | Status: DC | PRN

## 2015-08-26 MED ORDER — AZITHROMYCIN 250 MG PO TABS: ORAL_TABLET | ORAL | Status: DC

## 2015-08-26 NOTE — Telephone Encounter (Signed)
Unable to send RX in electronically, attempted to call pharmacy and reached a vm.  Requested someone call back.  Spoke with Kendrick called in.  Rx for Tussionex signed for by Dr Curt Bears.

## 2015-08-26 NOTE — Telephone Encounter (Signed)
Okay to call her a Z-Pak and Tussionex

## 2015-08-26 NOTE — Telephone Encounter (Signed)
Will forward to Dr Mare Ferrari to make sure this is OK to RX

## 2015-08-26 NOTE — Telephone Encounter (Signed)
Lm to cb - printed and signed RX left at the front desk to be picked up.

## 2015-08-26 NOTE — Telephone Encounter (Signed)
New message      Pt want  presc called in to  out pt pharmacy for a zpack and tussionex.  She states she has a cold and Dr Mare Ferrari always call in this medication for her.

## 2015-08-27 NOTE — Telephone Encounter (Signed)
Spoke with pt who picked up the RX yesterday.  She was very Patent attorney.

## 2015-08-27 NOTE — Telephone Encounter (Signed)
I left a message for the patient to call. 

## 2015-09-21 ENCOUNTER — Other Ambulatory Visit: Payer: Self-pay | Admitting: Cardiology

## 2015-09-21 MED FILL — DICLOFENAC SODIUM 1% GEL: 1 | 30 days supply | Qty: 100 | Fill #2

## 2015-09-21 MED FILL — clonazePAM 1 MG TABS: 1 | 30 days supply | Qty: 60 | Fill #3

## 2015-09-21 MED FILL — HYDROCHLOROTHIAZIDE 12.5 MG: 12.5 | 90 days supply | Qty: 90 | Fill #0

## 2015-10-04 ENCOUNTER — Telehealth: Payer: Self-pay | Admitting: Cardiology

## 2015-10-04 DIAGNOSIS — R059 Cough, unspecified: Secondary | ICD-10-CM

## 2015-10-04 DIAGNOSIS — R05 Cough: Secondary | ICD-10-CM

## 2015-10-04 NOTE — Telephone Encounter (Signed)
New message    Patient calling back to check on the status of form that needs to be complete

## 2015-10-04 NOTE — Telephone Encounter (Signed)
Pt says she still have not heard anything,said she called this morning.

## 2015-10-04 NOTE — Telephone Encounter (Signed)
Advised patient forms will be ready for pick up Thursday

## 2015-10-05 MED FILL — LEVOTHYROXINE 112 MCG TAB: 112 | 90 days supply | Qty: 90 | Fill #4

## 2015-10-06 NOTE — Telephone Encounter (Signed)
When spoke with patient she was complaining of chest congestion and up coughing all night Patient requested Rx for Tussionex to use for cough as needed  Discussed with  Dr. Mare Ferrari and ok to fill time 1

## 2015-10-07 MED ORDER — HYDROCOD POLST-CPM POLST ER 10-8 MG/5ML PO SUER: 5.0000 mL | Freq: Two times a day (BID) | ORAL | Status: DC | PRN

## 2015-10-07 MED FILL — HYDROCODONE-CHLORPHENIRAM S: 10-8 | 12 days supply | Qty: 120 | Fill #0

## 2015-10-21 MED FILL — clonazePAM 1 MG TABS: 1 | 30 days supply | Qty: 60 | Fill #4

## 2015-11-19 MED FILL — clonazePAM 1 MG TABS: 1 | 30 days supply | Qty: 60 | Fill #5

## 2015-12-06 MED FILL — RALOXIFENE HCL 60 MG TABLET: 60 | 30 days supply | Qty: 30 | Fill #0

## 2015-12-16 DIAGNOSIS — Z1389 Encounter for screening for other disorder: Secondary | ICD-10-CM | POA: Diagnosis not present

## 2015-12-16 DIAGNOSIS — M810 Age-related osteoporosis without current pathological fracture: Secondary | ICD-10-CM | POA: Diagnosis not present

## 2015-12-16 DIAGNOSIS — L900 Lichen sclerosus et atrophicus: Secondary | ICD-10-CM | POA: Diagnosis not present

## 2015-12-16 DIAGNOSIS — Z01419 Encounter for gynecological examination (general) (routine) without abnormal findings: Secondary | ICD-10-CM | POA: Diagnosis not present

## 2015-12-16 DIAGNOSIS — J069 Acute upper respiratory infection, unspecified: Secondary | ICD-10-CM | POA: Diagnosis not present

## 2015-12-16 DIAGNOSIS — Z6825 Body mass index (BMI) 25.0-25.9, adult: Secondary | ICD-10-CM | POA: Diagnosis not present

## 2015-12-16 MED FILL — AZITHROMYCIN 250 MG TABLET: 250 | 5 days supply | Qty: 6 | Fill #0

## 2015-12-16 MED FILL — CLOBETASOL 0.05% CREAM: 0.05 | 20 days supply | Qty: 60 | Fill #0

## 2015-12-20 ENCOUNTER — Other Ambulatory Visit: Payer: Self-pay | Admitting: *Deleted

## 2015-12-20 MED ORDER — CLONAZEPAM 1 MG PO TABS: 1.0000 mg | ORAL_TABLET | Freq: Every day | ORAL | Status: AC

## 2015-12-20 NOTE — Telephone Encounter (Signed)
Patient left a message on the refill voicemail requesting that Dr Acie Fredrickson refill this. Please advise. Thanks, MI

## 2015-12-20 NOTE — Telephone Encounter (Signed)
Advised patient that per Dr. Acie Fredrickson he will refill for #15 tablets and future refills will need to be handled by PCP.  Patient verbalized understanding and agreement and Rx was called in to [facility]

## 2015-12-21 MED FILL — clonazePAM 1 MG TABS: 1 | 30 days supply | Qty: 60 | Fill #0

## 2015-12-27 MED FILL — LEVOTHYROXINE 112 MCG TAB: 112 | 30 days supply | Qty: 30 | Fill #0

## 2015-12-27 MED FILL — HYDROCHLOROTHIAZIDE 12.5 MG: 12.5 | 90 days supply | Qty: 90 | Fill #1

## 2016-01-03 MED FILL — IBUPROFEN 600 MG TABLET: 600 | 68 days supply | Qty: 135 | Fill #1

## 2016-01-03 MED FILL — RALOXIFENE HCL 60 MG TABLET: 60 | 30 days supply | Qty: 30 | Fill #0

## 2016-01-06 DIAGNOSIS — Z0100 Encounter for examination of eyes and vision without abnormal findings: Secondary | ICD-10-CM | POA: Diagnosis not present

## 2016-01-10 MED FILL — PANTOPRAZOLE SOD DR 40 MG T: 40 | 90 days supply | Qty: 90 | Fill #1

## 2016-01-12 MED FILL — DICLOFENAC SODIUM 1% GEL: 1 | 30 days supply | Qty: 200 | Fill #0

## 2016-01-18 MED FILL — clonazePAM 1 MG TABS: 1 | 30 days supply | Qty: 60 | Fill #1

## 2016-01-24 ENCOUNTER — Encounter: Payer: Self-pay | Admitting: Cardiovascular Disease

## 2016-01-24 ENCOUNTER — Ambulatory Visit (INDEPENDENT_AMBULATORY_CARE_PROVIDER_SITE_OTHER): Payer: 59 | Admitting: Cardiovascular Disease

## 2016-01-24 VITALS — BP 132/84 | HR 66 | Ht 64.0 in | Wt 151.1 lb

## 2016-01-24 DIAGNOSIS — E785 Hyperlipidemia, unspecified: Secondary | ICD-10-CM | POA: Diagnosis not present

## 2016-01-24 DIAGNOSIS — E890 Postprocedural hypothyroidism: Secondary | ICD-10-CM

## 2016-01-24 DIAGNOSIS — I119 Hypertensive heart disease without heart failure: Secondary | ICD-10-CM | POA: Diagnosis not present

## 2016-01-24 DIAGNOSIS — I10 Essential (primary) hypertension: Secondary | ICD-10-CM

## 2016-01-24 DIAGNOSIS — I340 Nonrheumatic mitral (valve) insufficiency: Secondary | ICD-10-CM

## 2016-01-24 LAB — LIPID PANEL
CHOLESTEROL: 211 mg/dL — AB (ref 125–200)
HDL: 76 mg/dL (ref >=46)
LDL Cholesterol: 95 mg/dL (ref <130)
TRIGLYCERIDES: 199 mg/dL — AB (ref <150)
Total CHOL/HDL Ratio: 2.8 Ratio (ref <=5.0)
VLDL: 40 mg/dL — ABNORMAL HIGH (ref <30)

## 2016-01-24 LAB — COMPREHENSIVE METABOLIC PANEL
ALBUMIN: 4.6 g/dL (ref 3.6–5.1)
ALK PHOS: 60 U/L (ref 33–130)
ALT: 59 U/L — AB (ref 6–29)
AST: 47 U/L — AB (ref 10–35)
BILIRUBIN TOTAL: 0.4 mg/dL (ref 0.2–1.2)
BUN: 16 mg/dL (ref 7–25)
CALCIUM: 9.4 mg/dL (ref 8.6–10.4)
CO2: 26 mmol/L (ref 20–31)
Chloride: 103 mmol/L (ref 98–110)
Creat: 1 mg/dL — ABNORMAL HIGH (ref 0.50–0.99)
Glucose, Bld: 81 mg/dL (ref 65–99)
Potassium: 4.3 mmol/L (ref 3.5–5.3)
Sodium: 139 mmol/L (ref 135–146)
Total Protein: 7.3 g/dL (ref 6.1–8.1)

## 2016-01-24 LAB — TSH: TSH: 1.55 mIU/L

## 2016-01-24 NOTE — Progress Notes (Signed)
Cardiology Office Note   Date:  01/24/2016   ID:  Brittany Washington, DOB 1951-07-04, MRN [id number]  PCP:  Marton Redwood, MD  Cardiologist: Mertie Moores, MD  Chief Complaint  Patient presents with  . Hypertension      History of Present Illness: Brittany Washington is a 65 y.o. female who presents for  Scheduled follow-up visit  This pleasant 65 year old Caucasian female is seen for a scheduled 6 month followup office visit. 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 She also has a history of known heart murmur and a past history of abnormal liver function studies. She has a history of mild dyslipidemia but is not on statins because of her abnormal liver function studies. In the past her transaminases have been elevated significantly and have improved after she cut back on wine and on nonsteroidal anti-inflammatories such as ibuprofen.  She is no longer on her strict diet. Her weight is up 8 pounds since last visit.  She went off her diet because her hair started to fall out.. 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  Since last visit she has been feeling well. She's had no chest pain or shortness of breath. She tries to ride her exercise bike for an hour a day. She has not been having a recent back pain. Since we last saw her she had normal colonoscopy with Dr. Henrene Pastor  the patient was admitted on 06/25/15 with a severe headache and with ataxia. At first it was felt that she was having a stroke.  However the final diagnosis after multiple normal tests was that she had a complex migraine.  She is being followed up with Dr. Jannifer Franklin.  Her echocardiogram on 06/25/15 showed ejection fraction of 60-65% with mild mitral regurgitation.  Her carotid duplex was normal.  Jan 24, 2016  Brittany Washington is seen today - previous seen by Dr. Mare Ferrari .   Has HTN and a murmur  No CP, no  dyspnea[place] [facility] [place] [facility], rides her bike several times a week .     Past Medical History  Diagnosis Date  . Hypothyroidism   . Arthritis   . Benign hypertensive heart disease without heart failure   . Dyslipidemia     MILD  . Heart murmur, systolic     MECHNICAL  . Osteoporosis   . Hypertension     mild, lost weight, no meds now    Past Surgical History  Procedure Laterality Date  . Trigger finger release  01/09/2012    Procedure: RELEASE TRIGGER FINGER/A-1 PULLEY;  Surgeon: [name], MD;  Location: [place];  Service: Orthopedics;  Laterality: Left;  . Lumbar diskectomy w/ dissection, left l4 -- l5  02-23-2004  . Re-do lumbar diskectomy , l4  - l5  05-03-2004  . Posterior lumbar fusion  05-16-2005    L4 -- L5  . Orif left distal radial fx w/ bone graft  01-14-2009  . Laparoscopic cholecystectomy  1995  . Vaginal hysterectomy  1993    W/ BILATERAL SALPINGOOPHORECTOMY  . Hammer toe surgery Left 07/14/2013    Procedure: HAMMER TOE CORRECTION LEFT SECOND TOE, AUSTIN/AKIN LEFT;  Surgeon: [name], DPM;  Location: [place];  Service: Podiatry;  Laterality: Left;  . Colonoscopy    . Wrist fracture surgery  left  . Metatarsal osteotomy Left 04/02/2015    Procedure: LEFT FOOT METATARSAL OSTEOTOMY;  Surgeon: [name], MD;  Location: [place];  Service: Orthopedics;  Laterality: Left;     Current Outpatient Prescriptions  Medication Sig Dispense Refill  . aspirin 81 MG tablet Take 1 tablet (81 mg total) by mouth daily.    Marland Kitchen azithromycin (ZITHROMAX) 250 MG tablet Take 2 tablets on day 1, one tablet thereafter until completed 6 each 0  . cetirizine (ZYRTEC) 10 MG tablet Take 10 mg by mouth daily.    . chlorpheniramine-HYDROcodone (TUSSIONEX PENNKINETIC ER) 10-8 MG/5ML SUER Take 5 mLs by mouth every 12 (twelve) hours as needed for cough. 120 mL 0  . clonazePAM (KLONOPIN) 1 MG tablet Take 1  tablet (1 mg total) by mouth at bedtime. Take 1-2 tablets by mouth daily at bedtime as needed for sleep. 15 tablet 0  . hydrochlorothiazide (MICROZIDE) 12.5 MG capsule TAKE 1 CAPSULE BY MOUTH DAILY. 30 capsule 9  . HYDROcodone-acetaminophen (NORCO/VICODIN) 5-325 MG tablet Take 1 tablet by mouth every 6 (six) hours as needed for moderate pain (as needed for back pain).    Marland Kitchen ibuprofen (ADVIL,MOTRIN) 600 MG tablet Take 600 mg by mouth 2 (two) times daily as needed (pain).    Marland Kitchen levothyroxine (SYNTHROID, LEVOTHROID) 112 MCG tablet TAKE 1 TABLET BY MOUTH ONCE DAILY 90 tablet PRN  . pantoprazole (PROTONIX) 40 MG tablet Take 40 mg by mouth daily as needed (acid reflux).    . raloxifene (EVISTA) 60 MG tablet Take 60 mg by mouth daily.     No current facility-administered medications for this visit.    Allergies:   Sulfa antibiotics    Social History:  The patient  reports that she has never smoked. She has never used smokeless tobacco. She reports that she drinks about 4.2 oz of alcohol per week. She reports that she does not use illicit drugs.   Family History:  The patient's family history includes Colon cancer in her father; Heart attack in her brother and father; Ovarian cancer in her mother. There is no history of Rectal cancer, Stomach cancer, or Breast cancer.    ROS:  Please see the history of present illness.   Otherwise, review of systems are positive for none.   All other systems are reviewed and negative.    PHYSICAL EXAM: VS:  BP 132/84 mmHg  Pulse 66  Ht 5\' 4"  (1.626 m)  Wt 151 lb 1.9 oz (68.548 kg)  BMI 25.93 kg/m2 , BMI Body mass index is 25.93 kg/(m^2). GEN: Well nourished, well developed, in no acute distress HEENT: normal Neck: no JVD, carotid bruits, or masses Cardiac:  Mild Pectus excavatum RRR; soft systolic  murmurs, rubs, or gallops, no edema  Respiratory:  clear to auscultation bilaterally, normal work of breathing GI: soft, nontender, nondistended, + BS MS: no  deformity or atrophy Skin: warm and dry, no rash Neuro:  Strength and sensation are intact Psych: euthymic mood, full affect  EKG:  EKG is not ordered today.    Recent Labs: 06/25/2015: ALT 34; BUN 22*; Creatinine, Ser 0.90; Hemoglobin 16.0*; Platelets 175; Potassium 3.8; Sodium 141 07/22/2015: TSH 0.483    Lipid Panel    Component Value Date/Time   CHOL 186 06/26/2015 0230   TRIG 264* 06/26/2015 0230   HDL 61 06/26/2015 0230   CHOLHDL 3.0 06/26/2015 0230   VLDL 53* 06/26/2015 0230   LDLCALC 72 06/26/2015 0230   LDLDIRECT 78.9 01/21/2013 [id number]  Wt Readings from Last 3 Encounters:  01/24/16 151 lb 1.9 oz (68.548 kg)  08/09/15 147 lb 8 oz (66.906 kg)  07/22/15 146 lb 12.8 oz (66.588 kg)        ASSESSMENT AND PLAN:  1. benign hypertensive heart disease without heart failure. BP is well controlled  2. Hypercholesterolemia, significantly improved since she has lost weight. She is not on statin therapy.  3. Mitral regurgitation:  Stable  4. severe seasonal allergies quiescent at present   6. Hypothyroidism:  - managed by her medical doctor  Will check TSH today    Current medicines are reviewed at length with the patient today.  The patient does not have concerns regarding medicines.  The following changes have been made:  no change  Labs/ tests ordered today include:   No orders of the defined types were placed in this encounter.    I will see her in 6 months   Mertie Moores, MD  01/24/2016 9:10 AM    [address] [place],  [place], [place]  [address] Pager [phone] Phone: [phone]; Fax: [phone]

## 2016-01-24 NOTE — Patient Instructions (Signed)
Medication Instructions:  Your physician recommends that you continue on your current medications as directed. Please refer to the Current Medication list given to you today.   Labwork: TODAY - cholesterol, complete metabolic panel, TSH   Testing/Procedures: None Ordered   Follow-Up: Your physician wants you to follow-up in: 6 months with Dr. Acie Fredrickson.  You will receive a reminder letter in the mail two months in advance. If you don't receive a letter, please call our office to schedule the follow-up appointment.   If you need a refill on your cardiac medications before your next appointment, please call your pharmacy.   Thank you for choosing [facility]! Christen Bame, RN [phone]

## 2016-02-01 MED FILL — LEVOTHYROXINE 112 MCG TAB: 112 | 30 days supply | Qty: 30 | Fill #1

## 2016-02-08 MED FILL — RALOXIFENE HCL 60 MG TABLET: 60 | 30 days supply | Qty: 30 | Fill #1

## 2016-02-09 DIAGNOSIS — Z6826 Body mass index (BMI) 26.0-26.9, adult: Secondary | ICD-10-CM | POA: Diagnosis not present

## 2016-02-09 DIAGNOSIS — Z800 Family history of malignant neoplasm of digestive organs: Secondary | ICD-10-CM | POA: Diagnosis not present

## 2016-02-09 DIAGNOSIS — I340 Nonrheumatic mitral (valve) insufficiency: Secondary | ICD-10-CM | POA: Diagnosis not present

## 2016-02-09 DIAGNOSIS — I10 Essential (primary) hypertension: Secondary | ICD-10-CM | POA: Diagnosis not present

## 2016-02-09 DIAGNOSIS — E038 Other specified hypothyroidism: Secondary | ICD-10-CM | POA: Diagnosis not present

## 2016-02-09 DIAGNOSIS — R740 Nonspecific elevation of levels of transaminase and lactic acid dehydrogenase [LDH]: Secondary | ICD-10-CM | POA: Diagnosis not present

## 2016-02-09 DIAGNOSIS — M859 Disorder of bone density and structure, unspecified: Secondary | ICD-10-CM | POA: Diagnosis not present

## 2016-02-09 DIAGNOSIS — Z1389 Encounter for screening for other disorder: Secondary | ICD-10-CM | POA: Diagnosis not present

## 2016-02-09 DIAGNOSIS — M545 Low back pain: Secondary | ICD-10-CM | POA: Diagnosis not present

## 2016-02-16 MED FILL — clonazePAM 1 MG TABS: 1 | 30 days supply | Qty: 60 | Fill #0

## 2016-02-28 ENCOUNTER — Other Ambulatory Visit: Payer: Self-pay | Admitting: Adult Health

## 2016-02-28 MED FILL — LEVOTHYROXINE 112 MCG TAB: 112 | 30 days supply | Qty: 30 | Fill #0

## 2016-03-08 MED FILL — RALOXIFENE HCL 60 MG TABLET: 60 | 30 days supply | Qty: 30 | Fill #2

## 2016-03-16 ENCOUNTER — Other Ambulatory Visit: Payer: Self-pay | Admitting: Obstetrics and Gynecology

## 2016-03-16 DIAGNOSIS — Z1231 Encounter for screening mammogram for malignant neoplasm of breast: Secondary | ICD-10-CM

## 2016-03-17 MED FILL — clonazePAM 1 MG TABS: 1 | 30 days supply | Qty: 60 | Fill #1

## 2016-03-24 DIAGNOSIS — M25551 Pain in right hip: Secondary | ICD-10-CM | POA: Diagnosis not present

## 2016-03-29 MED FILL — LEVOTHYROXINE 112 MCG TAB: 112 | 30 days supply | Qty: 30 | Fill #1

## 2016-03-29 MED FILL — HYDROCHLOROTHIAZIDE 12.5 MG: 12.5 | 90 days supply | Qty: 90 | Fill #2

## 2016-03-29 MED FILL — RALOXIFENE HCL 60 MG TABLET: 60 | 30 days supply | Qty: 30 | Fill #3

## 2016-04-04 ENCOUNTER — Ambulatory Visit
Admission: RE | Admit: 2016-04-04 | Discharge: 2016-04-04 | Disposition: A | Discharge status: Home / Self Care | Payer: 59 | Source: Ambulatory Visit | Attending: Obstetrics and Gynecology | Admitting: Obstetrics and Gynecology

## 2016-04-04 DIAGNOSIS — Z1231 Encounter for screening mammogram for malignant neoplasm of breast: Secondary | ICD-10-CM

## 2016-04-10 MED FILL — DICLOFENAC SODIUM 1% GEL: 1 | 30 days supply | Qty: 200 | Fill #1

## 2016-04-19 MED FILL — RALOXIFENE HCL 60 MG TABLET: 60 | 30 days supply | Qty: 30 | Fill #4

## 2016-04-19 MED FILL — clonazePAM 1 MG TABS: 1 | 30 days supply | Qty: 60 | Fill #2

## 2016-04-26 MED FILL — LEVOTHYROXINE 112 MCG TAB: 112 | 30 days supply | Qty: 30 | Fill #2

## 2016-05-01 DIAGNOSIS — M79672 Pain in left foot: Secondary | ICD-10-CM | POA: Diagnosis not present

## 2016-05-01 MED FILL — HYDROCODON-APAP 5-325: 5-325 | 15 days supply | Qty: 30 | Fill #0

## 2016-05-19 MED FILL — clonazePAM 1 MG TABS: 1 | 30 days supply | Qty: 60 | Fill #3

## 2016-05-26 DIAGNOSIS — M79672 Pain in left foot: Secondary | ICD-10-CM | POA: Diagnosis not present

## 2016-05-29 ENCOUNTER — Other Ambulatory Visit: Payer: Self-pay | Admitting: Cardiovascular Disease

## 2016-05-29 MED FILL — LEVOTHYROXINE 112 MCG TAB: 112 | 30 days supply | Qty: 30 | Fill #3

## 2016-05-29 NOTE — Telephone Encounter (Signed)
Pharmacy requesting a refill on Ibuprofen 600 mg tablet. Would you like to refill this medication? Please advise

## 2016-06-06 MED FILL — IBUPROFEN 600 MG TABLET: 600 | 90 days supply | Qty: 90 | Fill #0

## 2016-06-12 DIAGNOSIS — M542 Cervicalgia: Secondary | ICD-10-CM | POA: Diagnosis not present

## 2016-06-12 DIAGNOSIS — M9902 Segmental and somatic dysfunction of thoracic region: Secondary | ICD-10-CM | POA: Diagnosis not present

## 2016-06-12 DIAGNOSIS — M546 Pain in thoracic spine: Secondary | ICD-10-CM | POA: Diagnosis not present

## 2016-06-12 DIAGNOSIS — M9901 Segmental and somatic dysfunction of cervical region: Secondary | ICD-10-CM | POA: Diagnosis not present

## 2016-06-12 MED FILL — RALOXIFENE HCL 60 MG TABLET: 60 | 30 days supply | Qty: 30 | Fill #5

## 2016-06-13 MED FILL — HYDROCODON-APAP 5-325: 5-325 | 15 days supply | Qty: 30 | Fill #0

## 2016-06-14 DIAGNOSIS — M542 Cervicalgia: Secondary | ICD-10-CM | POA: Diagnosis not present

## 2016-06-14 DIAGNOSIS — M9901 Segmental and somatic dysfunction of cervical region: Secondary | ICD-10-CM | POA: Diagnosis not present

## 2016-06-14 DIAGNOSIS — M546 Pain in thoracic spine: Secondary | ICD-10-CM | POA: Diagnosis not present

## 2016-06-14 DIAGNOSIS — M9902 Segmental and somatic dysfunction of thoracic region: Secondary | ICD-10-CM | POA: Diagnosis not present

## 2016-06-15 ENCOUNTER — Other Ambulatory Visit: Payer: Self-pay | Admitting: Cardiovascular Disease

## 2016-06-15 MED FILL — PANTOPRAZOLE SOD DR 40 MG T: 40 | 90 days supply | Qty: 90 | Fill #0

## 2016-06-15 NOTE — Telephone Encounter (Signed)
Pharmacy requesting a refill on pantoprazole 40 mg tablet. Would you like to refill this medication? Please advise °

## 2016-06-19 MED FILL — clonazePAM 1 MG TABS: 1 | 30 days supply | Qty: 60 | Fill #4

## 2016-06-19 NOTE — Telephone Encounter (Signed)
Please refer to PCP 

## 2016-06-26 MED FILL — HYDROCHLOROTHIAZIDE 12.5 MG: 12.5 | 30 days supply | Qty: 30 | Fill #3

## 2016-06-26 MED FILL — LEVOTHYROXINE 112 MCG TAB: 112 | 30 days supply | Qty: 30 | Fill #4

## 2016-07-13 MED FILL — RALOXIFENE HCL 60 MG TABLET: 60 | 30 days supply | Qty: 30 | Fill #6

## 2016-07-17 MED FILL — DICLOFENAC SODIUM 1% GEL: 1 | 28 days supply | Qty: 200 | Fill #0

## 2016-07-17 MED FILL — clonazePAM 1 MG TABS: 1 | 30 days supply | Qty: 60 | Fill #5

## 2016-07-20 DIAGNOSIS — Z0000 Encounter for general adult medical examination without abnormal findings: Secondary | ICD-10-CM | POA: Diagnosis not present

## 2016-07-20 DIAGNOSIS — E038 Other specified hypothyroidism: Secondary | ICD-10-CM | POA: Diagnosis not present

## 2016-07-20 DIAGNOSIS — I10 Essential (primary) hypertension: Secondary | ICD-10-CM | POA: Diagnosis not present

## 2016-07-20 DIAGNOSIS — M859 Disorder of bone density and structure, unspecified: Secondary | ICD-10-CM | POA: Diagnosis not present

## 2016-07-24 MED FILL — LEVOTHYROXINE 112 MCG TAB: 112 | 30 days supply | Qty: 30 | Fill #0

## 2016-07-31 ENCOUNTER — Other Ambulatory Visit: Payer: Self-pay | Admitting: Orthopaedic Surgery

## 2016-08-01 DIAGNOSIS — R740 Nonspecific elevation of levels of transaminase and lactic acid dehydrogenase [LDH]: Secondary | ICD-10-CM | POA: Diagnosis not present

## 2016-08-01 DIAGNOSIS — E038 Other specified hypothyroidism: Secondary | ICD-10-CM | POA: Diagnosis not present

## 2016-08-01 DIAGNOSIS — Z23 Encounter for immunization: Secondary | ICD-10-CM | POA: Diagnosis not present

## 2016-08-01 DIAGNOSIS — Z6827 Body mass index (BMI) 27.0-27.9, adult: Secondary | ICD-10-CM | POA: Diagnosis not present

## 2016-08-01 DIAGNOSIS — Z01818 Encounter for other preprocedural examination: Secondary | ICD-10-CM | POA: Diagnosis not present

## 2016-08-01 DIAGNOSIS — I10 Essential (primary) hypertension: Secondary | ICD-10-CM | POA: Diagnosis not present

## 2016-08-01 DIAGNOSIS — Z0000 Encounter for general adult medical examination without abnormal findings: Secondary | ICD-10-CM | POA: Diagnosis not present

## 2016-08-01 DIAGNOSIS — Z1389 Encounter for screening for other disorder: Secondary | ICD-10-CM | POA: Diagnosis not present

## 2016-08-01 DIAGNOSIS — M859 Disorder of bone density and structure, unspecified: Secondary | ICD-10-CM | POA: Diagnosis not present

## 2016-08-01 DIAGNOSIS — I340 Nonrheumatic mitral (valve) insufficiency: Secondary | ICD-10-CM | POA: Diagnosis not present

## 2016-08-01 DIAGNOSIS — Z800 Family history of malignant neoplasm of digestive organs: Secondary | ICD-10-CM | POA: Diagnosis not present

## 2016-08-01 MED FILL — HYDROCHLOROTHIAZIDE 12.5 MG: 12.5 | 90 days supply | Qty: 90 | Fill #0

## 2016-08-03 MED FILL — OSELTAMIVIR PHOS 75 MG CAP: 75 | 5 days supply | Qty: 10 | Fill #0

## 2016-08-07 DIAGNOSIS — M79672 Pain in left foot: Secondary | ICD-10-CM | POA: Diagnosis not present

## 2016-08-09 MED FILL — CLOBETASOL 0.05% CREAM: 0.05 | 20 days supply | Qty: 60 | Fill #1

## 2016-08-11 ENCOUNTER — Encounter (HOSPITAL_BASED_OUTPATIENT_CLINIC_OR_DEPARTMENT_OTHER): Payer: Self-pay | Admitting: *Deleted

## 2016-08-14 MED FILL — clonazePAM 1 MG TABS: 1 | 30 days supply | Qty: 60 | Fill #0

## 2016-08-16 NOTE — H&P (Signed)
Brittany Washington is an 65 y.o. female.   Chief Complaint: left foot pain HPI: Brittany Washington is set up for surgery in a couple of weeks. Her foot continues to bother her on the undersurface of her second MTP joint. There is no longer any pain under the third MTP joint. She continues with various shoe inserts and pads.  Radiographs:  X-rays that were ordered, performed, and interpreted by me today included multiple views of the left foot show a chronically dislocated second toe.  She has also had multiple prior surgeries on this foot.  No acute bony abnormalities are noted.  Past Medical History:  Diagnosis Date  . Arthritis   . Benign hypertensive heart disease without heart failure   . Dyslipidemia    MILD  . Heart murmur, systolic    MECHNICAL  . Hypertension    mild, lost weight, no meds now  . Hypothyroidism   . Osteoporosis     Past Surgical History:  Procedure Laterality Date  . COLONOSCOPY    . HAMMER TOE SURGERY Left 07/14/2013   Procedure: HAMMER TOE CORRECTION LEFT SECOND TOE, [place]USTIN/AKIN LEFT;  Surgeon: [name], DPM;  Location: [facility];  Service: Podiatry;  Laterality: Left;  . LAPAROSCOPIC CHOLECYSTECTOMY  1995  . LUMBAR DISKECTOMY W/ DISSECTION, LEFT L4 -- L5  02-23-2004  . METATARSAL OSTEOTOMY Left 04/02/2015   Procedure: LEFT FOOT METATARSAL OSTEOTOMY;  Surgeon: [name], MD;  Location: [facility];  Service: Orthopedics;  Laterality: Left;  . ORIF LEFT DISTAL RADIAL FX W/ BONE GRAFT  01-14-2009  . POSTERIOR LUMBAR FUSION  05-16-2005   L4 -- L5  . RE-DO LUMBAR DISKECTOMY , L4  - L5  05-03-2004  . TRIGGER FINGER RELEASE  01/09/2012   Procedure: RELEASE TRIGGER FINGER/A-1 PULLEY;  Surgeon: [name], MD;  Location: [facility];  Service: Orthopedics;  Laterality: Left;  Marland Kitchen VAGINAL HYSTERECTOMY  1993   W/ BILATERAL SALPINGOOPHORECTOMY  . WRIST FRACTURE SURGERY     left    Family History  Problem Relation  Age of Onset  . Heart attack Father   . Colon cancer Father   . Heart attack Brother   . Ovarian cancer Mother   . Rectal cancer Neg Hx   . Stomach cancer Neg Hx   . Breast cancer Neg Hx    Social History:  reports that she has never smoked. She has never used smokeless tobacco. She reports that she drinks about 4.2 oz of alcohol per week . She reports that she does not use drugs.  Allergies:  Allergies  Allergen Reactions  . Sulfa Antibiotics Other (See Comments)    Body aches    No prescriptions prior to admission.    No results found for this or any previous visit (from the past 48 hour(s)). No results found.  Review of Systems  Musculoskeletal: Positive for joint pain.       Left foot  All other systems reviewed and are negative.   Height 5\' 4"  (1.626 m), weight 68 kg (150 lb). Physical Exam  Constitutional: She is oriented to person, place, and time. She appears well-developed and well-nourished.  HENT:  Head: Normocephalic and atraumatic.  Eyes: Pupils are equal, round, and reactive to light.  Neck: Normal range of motion.  Cardiovascular: Normal rate and regular rhythm.   Respiratory: Effort normal.  GI: Soft.  Musculoskeletal:  Left foot is painful under the second MTP joint. She has a  callus in this location. She has no gross deformity despite the fact that her second MTP joint is dislocated. Sensation is intact distally. Her arch is low but still present. Her Achilles does not feel tight. She has a palpable pulse in the foot. She has healed dorsal incisions.   Neurological: She is alert and oriented to person, place, and time.  Skin: Skin is warm and dry.  Psychiatric: She has a normal mood and affect. Her behavior is normal. Judgment and thought content normal.     Assessment/Plan Assessment: Left foot metatarsalgia with hallux valgus surgery 2014 and third metatarsal osteotomy 04/02/15  Plan: We are going to go forward with a second metatarsal osteotomy.  We did the same procedure on the third metatarsal a year and a half back. I again stressed that there are more aggressive operations that she could consider but I'm not sure the results would be much better. I reviewed risk of anesthesia, infection, nonunion.   [name], [name], PA-C 08/16/2016, 1:18 PM

## 2016-08-18 ENCOUNTER — Ambulatory Visit (HOSPITAL_BASED_OUTPATIENT_CLINIC_OR_DEPARTMENT_OTHER): Payer: 59 | Admitting: Certified Registered"

## 2016-08-18 ENCOUNTER — Ambulatory Visit (HOSPITAL_BASED_OUTPATIENT_CLINIC_OR_DEPARTMENT_OTHER)
Admission: RE | Admit: 2016-08-18 | Discharge: 2016-08-18 | Disposition: A | Discharge status: Home / Self Care | Payer: 59 | Source: Ambulatory Visit | Attending: Orthopaedic Surgery | Admitting: Orthopaedic Surgery

## 2016-08-18 ENCOUNTER — Encounter (HOSPITAL_BASED_OUTPATIENT_CLINIC_OR_DEPARTMENT_OTHER)
Admission: RE | Disposition: A | Discharge status: Home / Self Care | Payer: Self-pay | Source: Ambulatory Visit | Attending: Orthopaedic Surgery

## 2016-08-18 ENCOUNTER — Encounter (HOSPITAL_BASED_OUTPATIENT_CLINIC_OR_DEPARTMENT_OTHER): Payer: Self-pay | Admitting: *Deleted

## 2016-08-18 DIAGNOSIS — M7742 Metatarsalgia, left foot: Secondary | ICD-10-CM | POA: Diagnosis not present

## 2016-08-18 DIAGNOSIS — E785 Hyperlipidemia, unspecified: Secondary | ICD-10-CM | POA: Insufficient documentation

## 2016-08-18 DIAGNOSIS — I119 Hypertensive heart disease without heart failure: Secondary | ICD-10-CM | POA: Insufficient documentation

## 2016-08-18 DIAGNOSIS — E039 Hypothyroidism, unspecified: Secondary | ICD-10-CM | POA: Insufficient documentation

## 2016-08-18 DIAGNOSIS — M24875 Other specific joint derangements left foot, not elsewhere classified: Secondary | ICD-10-CM | POA: Insufficient documentation

## 2016-08-18 HISTORY — PX: METATARSAL OSTEOTOMY: SHX1641

## 2016-08-18 SURGERY — OSTEOTOMY, METATARSAL BONE
Anesthesia: Monitor Anesthesia Care | Site: Foot | Laterality: Left

## 2016-08-18 MED ORDER — LACTATED RINGERS IV SOLN
INTRAVENOUS | Status: DC
  Administered 2016-08-18: 10 mL/h via INTRAVENOUS

## 2016-08-18 MED ORDER — LACTATED RINGERS IV SOLN: INTRAVENOUS | Status: DC

## 2016-08-18 MED ORDER — OXYCODONE HCL 5 MG/5ML PO SOLN: 5.0000 mg | Freq: Once | ORAL | Status: DC | PRN

## 2016-08-18 MED ORDER — HYDROMORPHONE HCL 1 MG/ML IJ SOLN: 0.2500 mg | INTRAMUSCULAR | Status: DC | PRN

## 2016-08-18 MED ORDER — BUPIVACAINE-EPINEPHRINE (PF) 0.5% -1:200000 IJ SOLN
INTRAMUSCULAR | Status: DC | PRN
  Administered 2016-08-18: 30 mL via PERINEURAL

## 2016-08-18 MED ORDER — ONDANSETRON HCL 4 MG/2ML IJ SOLN
INTRAMUSCULAR | Status: DC | PRN
  Administered 2016-08-18: 4 mg via INTRAVENOUS

## 2016-08-18 MED ORDER — OXYCODONE HCL 5 MG PO TABS: 5.0000 mg | ORAL_TABLET | Freq: Once | ORAL | Status: DC | PRN

## 2016-08-18 MED ORDER — PROPOFOL 500 MG/50ML IV EMUL
INTRAVENOUS | Status: DC | PRN
  Administered 2016-08-18: 100 ug/kg/min via INTRAVENOUS

## 2016-08-18 MED ORDER — CEFAZOLIN SODIUM-DEXTROSE 2-4 GM/100ML-% IV SOLN
INTRAVENOUS | Status: AC
  Filled 2016-08-18: qty 100

## 2016-08-18 MED ORDER — CEFAZOLIN SODIUM-DEXTROSE 2-4 GM/100ML-% IV SOLN
2.0000 g | INTRAVENOUS | Status: AC
  Administered 2016-08-18: 2 g via INTRAVENOUS

## 2016-08-18 MED ORDER — MIDAZOLAM HCL 2 MG/2ML IJ SOLN
INTRAMUSCULAR | Status: AC
  Filled 2016-08-18: qty 2

## 2016-08-18 MED ORDER — LIDOCAINE HCL (CARDIAC) 20 MG/ML IV SOLN
INTRAVENOUS | Status: DC | PRN
  Administered 2016-08-18: 30 mg via INTRAVENOUS

## 2016-08-18 MED ORDER — FENTANYL CITRATE (PF) 100 MCG/2ML IJ SOLN
INTRAMUSCULAR | Status: AC
  Filled 2016-08-18: qty 2

## 2016-08-18 MED ORDER — MIDAZOLAM HCL 2 MG/2ML IJ SOLN
1.0000 mg | INTRAMUSCULAR | Status: DC | PRN
  Administered 2016-08-18: 1 mg via INTRAVENOUS

## 2016-08-18 MED ORDER — CHLORHEXIDINE GLUCONATE 4 % EX LIQD: 60.0000 mL | Freq: Once | CUTANEOUS | Status: DC

## 2016-08-18 MED ORDER — ONDANSETRON HCL 4 MG/2ML IJ SOLN: 4.0000 mg | Freq: Four times a day (QID) | INTRAMUSCULAR | Status: DC | PRN

## 2016-08-18 MED ORDER — FENTANYL CITRATE (PF) 100 MCG/2ML IJ SOLN
50.0000 ug | INTRAMUSCULAR | Status: DC | PRN
  Administered 2016-08-18: 50 ug via INTRAVENOUS

## 2016-08-18 MED ORDER — SCOPOLAMINE 1 MG/3DAYS TD PT72: 1.0000 | MEDICATED_PATCH | Freq: Once | TRANSDERMAL | Status: DC | PRN

## 2016-08-18 MED FILL — OXYCODONE W/APAP 5/325 TAB: 5-325 | 4 days supply | Qty: 30 | Fill #0

## 2016-08-18 SURGICAL SUPPLY — 76 items
BANDAGE ACE 3X5.8 VEL STRL LF (GAUZE/BANDAGES/DRESSINGS) ×2 IMPLANT
BANDAGE ESMARK 6X9 LF (GAUZE/BANDAGES/DRESSINGS) ×1 IMPLANT
BLADE AVERAGE 25X9 (BLADE) ×1 IMPLANT
BLADE CRESCENTIC 13.5X.38X32 (BLADE) IMPLANT
BLADE OSC/SAG .038X5.5 CUT EDG (BLADE) IMPLANT
BLADE SURG 15 STRL LF DISP TIS (BLADE) ×2 IMPLANT
BLADE SURG 15 STRL SS (BLADE) ×4
BNDG CMPR 9X6 STRL LF SNTH (GAUZE/BANDAGES/DRESSINGS) ×1
BNDG ESMARK 6X9 LF (GAUZE/BANDAGES/DRESSINGS) ×2
BNDG GAUZE ELAST 4 BULKY (GAUZE/BANDAGES/DRESSINGS) ×2 IMPLANT
CANISTER SUCT 1200ML W/VALVE (MISCELLANEOUS) IMPLANT
CAP PIN PROTECTOR ORTHO WHT (CAP) IMPLANT
COVER BACK TABLE 60X90IN (DRAPES) ×2 IMPLANT
CUFF TOURNIQUET SINGLE 18IN (TOURNIQUET CUFF) IMPLANT
CUFF TOURNIQUET SINGLE 24IN (TOURNIQUET CUFF) ×1 IMPLANT
CUFF TOURNIQUET SINGLE 34IN LL (TOURNIQUET CUFF) IMPLANT
DECANTER SPIKE VIAL GLASS SM (MISCELLANEOUS) IMPLANT
DRAPE EXTREMITY T 121X128X90 (DRAPE) ×2 IMPLANT
DRAPE OEC MINIVIEW 54X84 (DRAPES) ×1 IMPLANT
DRAPE U 20/CS (DRAPES) ×1 IMPLANT
DRAPE U-SHAPE 47X51 STRL (DRAPES) ×2 IMPLANT
DRSG EMULSION OIL 3X3 NADH (GAUZE/BANDAGES/DRESSINGS) ×1 IMPLANT
DRSG PAD ABDOMINAL 8X10 ST (GAUZE/BANDAGES/DRESSINGS) ×1 IMPLANT
DRSG TELFA 3X8 NADH (GAUZE/BANDAGES/DRESSINGS) IMPLANT
DURAPREP 26ML APPLICATOR (WOUND CARE) ×2 IMPLANT
ELECT NDL TIP 2.8 STRL (NEEDLE) ×1 IMPLANT
ELECT NEEDLE TIP 2.8 STRL (NEEDLE) ×2 IMPLANT
ELECT REM PT RETURN 9FT ADLT (ELECTROSURGICAL) ×2
ELECTRODE REM PT RTRN 9FT ADLT (ELECTROSURGICAL) ×1 IMPLANT
GAUZE SPONGE 4X4 12PLY STRL (GAUZE/BANDAGES/DRESSINGS) ×2 IMPLANT
GAUZE SPONGE 4X4 16PLY XRAY LF (GAUZE/BANDAGES/DRESSINGS) IMPLANT
GAUZE XEROFORM 1X8 LF (GAUZE/BANDAGES/DRESSINGS) ×1 IMPLANT
GLOVE BIO SURGEON STRL SZ8 (GLOVE) ×4 IMPLANT
GLOVE BIOGEL PI IND STRL 7.0 (GLOVE) IMPLANT
GLOVE BIOGEL PI IND STRL 8 (GLOVE) ×2 IMPLANT
GLOVE BIOGEL PI INDICATOR 7.0 (GLOVE) ×2
GLOVE BIOGEL PI INDICATOR 8 (GLOVE) ×2
GLOVE ECLIPSE 6.5 STRL STRAW (GLOVE) ×1 IMPLANT
GOWN STRL REUS W/ TWL LRG LVL3 (GOWN DISPOSABLE) ×1 IMPLANT
GOWN STRL REUS W/ TWL XL LVL3 (GOWN DISPOSABLE) ×2 IMPLANT
GOWN STRL REUS W/TWL LRG LVL3 (GOWN DISPOSABLE) ×2
GOWN STRL REUS W/TWL XL LVL3 (GOWN DISPOSABLE) ×4
K-WIRE .045X4 (WIRE) IMPLANT
K-WIRE .062X4 (WIRE) IMPLANT
NDL HYPO 25X1 1.5 SAFETY (NEEDLE) IMPLANT
NEEDLE HYPO 22GX1.5 SAFETY (NEEDLE) IMPLANT
NEEDLE HYPO 25X1 1.5 SAFETY (NEEDLE) IMPLANT
NS IRRIG 1000ML POUR BTL (IV SOLUTION) ×2 IMPLANT
PACK BASIN DAY SURGERY FS (CUSTOM PROCEDURE TRAY) ×2 IMPLANT
PAD DRESSING TELFA 3X8 NADH (GAUZE/BANDAGES/DRESSINGS) IMPLANT
PADDING CAST ABS 3INX4YD NS (CAST SUPPLIES)
PADDING CAST ABS 4INX4YD NS (CAST SUPPLIES)
PADDING CAST ABS COTTON 3X4 (CAST SUPPLIES) IMPLANT
PADDING CAST ABS COTTON 4X4 ST (CAST SUPPLIES) ×1 IMPLANT
PENCIL BUTTON HOLSTER BLD 10FT (ELECTRODE) ×2 IMPLANT
SHEET MEDIUM DRAPE 40X70 STRL (DRAPES) ×1 IMPLANT
STOCKINETTE 6  STRL (DRAPES) ×1
STOCKINETTE 6 STRL (DRAPES) ×1 IMPLANT
SUCTION FRAZIER HANDLE 10FR (MISCELLANEOUS)
SUCTION TUBE FRAZIER 10FR DISP (MISCELLANEOUS) IMPLANT
SUT BONE WAX W31G (SUTURE) IMPLANT
SUT ETHILON 3 0 PS 1 (SUTURE) ×1 IMPLANT
SUT ETHILON 4 0 PS 2 18 (SUTURE) IMPLANT
SUT ETHILON 5 0 PS 2 18 (SUTURE) IMPLANT
SUT VIC AB 0 CT1 27 (SUTURE)
SUT VIC AB 0 CT1 27XBRD ANBCTR (SUTURE) IMPLANT
SUT VIC AB 2-0 SH 27 (SUTURE)
SUT VIC AB 2-0 SH 27XBRD (SUTURE) IMPLANT
SUT VIC AB 4-0 P-3 18XBRD (SUTURE) IMPLANT
SUT VIC AB 4-0 P3 18 (SUTURE)
SYR BULB 3OZ (MISCELLANEOUS) ×2 IMPLANT
SYR CONTROL 10ML LL (SYRINGE) IMPLANT
TOWEL OR 17X24 6PK STRL BLUE (TOWEL DISPOSABLE) ×3 IMPLANT
TOWEL OR NON WOVEN STRL DISP B (DISPOSABLE) ×2 IMPLANT
TUBE CONNECTING 20X1/4 (TUBING) IMPLANT
UNDERPAD 30X30 (UNDERPADS AND DIAPERS) ×2 IMPLANT

## 2016-08-18 NOTE — Discharge Instructions (Signed)
°  Post Anesthesia Home Care Instructions ° °Activity: °Get plenty of rest for the remainder of the day. A responsible adult should stay with you for 24 hours following the procedure.  °For the next 24 hours, DO NOT: °-Drive a car °-Operate machinery °-Drink alcoholic beverages °-Take any medication unless instructed by your physician °-Make any legal decisions or sign important papers. ° °Meals: °Start with liquid foods such as gelatin or soup. Progress to regular foods as tolerated. Avoid greasy, spicy, heavy foods. If nausea and/or vomiting occur, drink only clear liquids until the nausea and/or vomiting subsides. Call your physician if vomiting continues. ° °Special Instructions/Symptoms: °Your throat may feel dry or sore from the anesthesia or the breathing tube placed in your throat during surgery. If this causes discomfort, gargle with warm salt water. The discomfort should disappear within 24 hours. ° °If you had a scopolamine patch placed behind your ear for the management of post- operative nausea and/or vomiting: ° °1. The medication in the patch is effective for 72 hours, after which it should be removed.  Wrap patch in a tissue and discard in the trash. Wash hands thoroughly with soap and water. °2. You may remove the patch earlier than 72 hours if you experience unpleasant side effects which may include dry mouth, dizziness or visual disturbances. °3. Avoid touching the patch. Wash your hands with soap and water after contact with the patch. °  °Regional Anesthesia Blocks ° °1. Numbness or the inability to move the "blocked" extremity may last from 3-48 hours after placement. The length of time depends on the medication injected and your individual response to the medication. If the numbness is not going away after 48 hours, call your surgeon. ° °2. The extremity that is blocked will need to be protected until the numbness is gone and the  Strength has returned. Because you cannot feel it, you will need  to take extra care to avoid injury. Because it may be weak, you may have difficulty moving it or using it. You may not know what position it is in without looking at it while the block is in effect. ° °3. For blocks in the legs and feet, returning to weight bearing and walking needs to be done carefully. You will need to wait until the numbness is entirely gone and the strength has returned. You should be able to move your leg and foot normally before you try and bear weight or walk. You will need someone to be with you when you first try to ensure you do not fall and possibly risk injury. ° °4. Bruising and tenderness at the needle site are common side effects and will resolve in a few days. ° °5. Persistent numbness or new problems with movement should be communicated to the surgeon or the [facility] ([phone])/ [facility] ([phone]). °

## 2016-08-18 NOTE — Anesthesia Procedure Notes (Signed)
Anesthesia Regional Block:  Popliteal block  Pre-Anesthetic Checklist: ,, timeout performed, Correct Patient, Correct Site, Correct Laterality, Correct Procedure, Correct Position, site marked, Risks and benefits discussed,  Surgical consent,  Pre-op evaluation,  At surgeon's request and post-op pain management  Laterality: Left  Prep: chloraprep       Needles:  Injection technique: Single-shot  Needle Type: Echogenic Stimulator Needle     Needle Length:cm 9 cm Needle Gauge: 21 G    Additional Needles:  Procedures: ultrasound guided (picture in chart) and nerve stimulator Popliteal block  Nerve Stimulator or Paresthesia:  Response: plantar flexion of foot, 0.45 mA,   Additional Responses:   Narrative:  Start time: 08/18/2016 7:50 AM End time: 08/18/2016 7:57 AM Injection made incrementally with aspirations every 5 mL.  Performed by: Personally  Anesthesiologist: Marico Buckle  Additional Notes: Functioning IV was confirmed and monitors were applied.  A 13mm 21ga Arrow echogenic stimulator needle was used. Sterile prep and drape,hand hygiene and sterile gloves were used.  Negative aspiration and negative test dose prior to incremental administration of local anesthetic. The patient tolerated the procedure well.  Ultrasound guidance: relevent anatomy identified, needle position confirmed, local anesthetic spread visualized around nerve(s), vascular puncture avoided.  Image printed for medical record.

## 2016-08-18 NOTE — Anesthesia Postprocedure Evaluation (Signed)
Anesthesia Post Note  Patient: Brittany Washington  Procedure(s) Performed: Procedure(s) (LRB): METATARSAL OSTEOTOMY SECOND TOE (Left)  Patient location during evaluation: PACU Anesthesia Type: MAC and Regional Level of consciousness: awake and alert Pain management: pain level controlled Vital Signs Assessment: post-procedure vital signs reviewed and stable Respiratory status: spontaneous breathing, nonlabored ventilation, respiratory function stable and patient connected to nasal cannula oxygen Cardiovascular status: stable and blood pressure returned to baseline Anesthetic complications: no    Last Vitals:  Vitals:   08/18/16 1000 08/18/16 1028  BP: (!) 144/82 (!) 143/80  Pulse: 65 62  Resp: (!) 22 16  Temp:  36.4 C    Last Pain:  Vitals:   08/18/16 1028  TempSrc:   PainSc: 0-No pain    LLE Motor Response: No movement due to regional block (08/18/16 1028) LLE Sensation: Numbness;No pain;No tingling (08/18/16 1028)          [place]

## 2016-08-18 NOTE — Anesthesia Preprocedure Evaluation (Signed)
Anesthesia Evaluation  Patient identified by MRN, date of birth, ID band Patient awake    Reviewed: Allergy & Precautions, H&P , NPO status , Patient's Chart, lab work & pertinent test results  Airway Mallampati: II   Neck ROM: full    Dental   Pulmonary neg pulmonary ROS,    breath sounds clear to auscultation       Cardiovascular hypertension,  Rhythm:regular Rate:Normal     Neuro/Psych  Headaches,    GI/Hepatic   Endo/Other  Hypothyroidism   Renal/GU      Musculoskeletal  (+) Arthritis ,   Abdominal   Peds  Hematology   Anesthesia Other Findings   Reproductive/Obstetrics                             Anesthesia Physical Anesthesia Plan  ASA: II  Anesthesia Plan: MAC and Regional   Post-op Pain Management:    Induction: Intravenous  Airway Management Planned: Simple Face Mask  Additional Equipment:   Intra-op Plan:   Post-operative Plan:   Informed Consent: I have reviewed the patients History and Physical, chart, labs and discussed the procedure including the risks, benefits and alternatives for the proposed anesthesia with the patient or authorized representative who has indicated his/her understanding and acceptance.     Plan Discussed with: CRNA, Anesthesiologist and Surgeon  Anesthesia Plan Comments:         Anesthesia Quick Evaluation

## 2016-08-18 NOTE — Progress Notes (Signed)
Assisted Dr. Hodierne with left, ultrasound guided, popliteal block. Side rails up, monitors on throughout procedure. See vital signs in flow sheet. Tolerated Procedure well. 

## 2016-08-18 NOTE — Op Note (Signed)
#  [id number] 

## 2016-08-18 NOTE — Transfer of Care (Signed)
Immediate Anesthesia Transfer of Care Note  Patient: Brittany Washington  Procedure(s) Performed: Procedure(s): METATARSAL OSTEOTOMY SECOND TOE (Left)  Patient Location: PACU  Anesthesia Type:MAC combined with regional for post-op pain  Level of Consciousness: awake, alert , oriented and patient cooperative  Airway & Oxygen Therapy: Patient Spontanous Breathing  Post-op Assessment: Report given to RN and Post -op Vital signs reviewed and stable  Post vital signs: Reviewed and stable  Last Vitals:  Vitals:   08/18/16 0745 08/18/16 0750  BP: (!) 151/77 (!) 144/76  Pulse: 66 65  Resp: 17 14  Temp:      Last Pain:  Vitals:   08/18/16 0710  TempSrc: Oral  PainSc: 0-No pain         Complications: No apparent anesthesia complications

## 2016-08-18 NOTE — Interval H&P Note (Signed)
History and Physical Interval Note:  08/18/2016 7:42 AM  Brittany Washington  has presented today for surgery, with the diagnosis of LEFT FOOT METATARSALGIA  The various methods of treatment have been discussed with the patient and family. After consideration of risks, benefits and other options for treatment, the patient has consented to  Procedure(s): METATARSAL OSTEOTOMY SECOND TOE (Left) as a surgical intervention .  The patient's history has been reviewed, patient examined, no change in status, stable for surgery.  I have reviewed the patient's chart and labs.  Questions were answered to the patient's satisfaction.     [name]

## 2016-08-18 NOTE — Anesthesia Procedure Notes (Signed)
Procedure Name: MAC Date/Time: 08/18/2016 8:30 AM Performed by: Maurianna Benard D Pre-anesthesia Checklist: Patient identified, Emergency Drugs available, Suction available, Patient being monitored and Timeout performed Patient Re-evaluated:Patient Re-evaluated prior to inductionOxygen Delivery Method: Simple face mask

## 2016-08-21 ENCOUNTER — Encounter (HOSPITAL_BASED_OUTPATIENT_CLINIC_OR_DEPARTMENT_OTHER): Payer: Self-pay | Admitting: Orthopaedic Surgery

## 2016-08-21 DIAGNOSIS — Z1212 Encounter for screening for malignant neoplasm of rectum: Secondary | ICD-10-CM | POA: Diagnosis not present

## 2016-08-21 NOTE — Op Note (Signed)
NAMESAYLOR, SOLIZ             ACCOUNT NO.:  [id number]  MEDICAL RECORD NO.:  [id number]  LOCATION:                                FACILITY:  [facility]  PHYSICIAN:  Monico Blitz. Siraj Dermody, M.D.DATE OF BIRTH:  09/17/1950  DATE OF PROCEDURE:  08/18/2016 DATE OF DISCHARGE:  08/18/2016                              OPERATIVE REPORT   PREOPERATIVE DIAGNOSIS:  Left foot metatarsalgia.  POSTOPERATIVE DIAGNOSIS:  Left foot metatarsalgia.  PROCEDURE:  Left 2nd metatarsal osteotomy.  ANESTHESIA:  Ankle block and MAC.  ATTENDING SURGEON:  Monico Blitz. Rhona Raider, M.D.  ASSISTANT:  None.  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  She had some hallux valgus surgery 3 years ago and has persisted with difficulty.  About a year and a half ago, she had a 3rd metatarsal osteotomy for pain under that metatarsal head.  At this point, she has pain in the 2nd metatarsal head which is refractory to nonsurgical management.  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  She is offered several different procedures and encouraged to consider the minimalist procedure which is an osteotomy at the base of the second metatarsal.  Informed operative consent was obtained after discussion of possible complications including reaction to anesthesia and infection.  SUMMARY OF FINDINGS AND PROCEDURE:  Under ankle block and MAC, a left foot 2nd metatarsal base osteotomy was performed.  I removed a small wedge of bone and left periosteum intact.  I used fluoroscopic guidance throughout the case.  She was closed primarily and discharged home.  DESCRIPTION OF PROCEDURE:  The patient was taken to the operating suite where ankle block and sedation were applied.  She was positioned supine and prepped and draped in normal sterile fashion.  After administration of preop IV Kefzol and an appropriate time out, the left leg was elevated, exsanguinated, and  tourniquet inflated out about the calf.  I made a small dorsal incision with dissection down to the base of the 2nd metatarsal.  Then made a small cut to this bone removing a small wedge of bone dorsally.  It did seem to bring the 2nd metatarsal head up in line with the other metatarsals at the forefoot.  The wound was irrigated.  I took some of the bone trimmings around the osteotomy site. The tourniquet was deflated and skin edges bled well.  Her toe became pink and warm immediately.  We reapproximated with nylon in an interrupted fashion followed by Adaptic, dry gauze, and a loose Ace wrap.  ESTIMATED BLOOD LOSS AND FLUIDS:  Obtained from anesthesia records as can accurate tourniquet time.  DISPOSITION:  The patient was taken to recovery in stable condition. She was to go home same-day and follow up in the office closely.  I will contact her by phone tonight.     Monico Blitz Rhona Raider, M.D.     PGD/MEDQ  D:  08/18/2016  T:  08/19/2016  Job:  [id number]

## 2016-08-25 DIAGNOSIS — Z9889 Other specified postprocedural states: Secondary | ICD-10-CM | POA: Diagnosis not present

## 2016-08-29 MED FILL — LEVOTHYROXINE 112 MCG TAB: 112 | 30 days supply | Qty: 30 | Fill #1

## 2016-09-14 MED FILL — clonazePAM 1 MG TABS: 1 | 30 days supply | Qty: 60 | Fill #1

## 2016-09-14 MED FILL — IBUPROFEN 600 MG TABLET: 600 | 90 days supply | Qty: 90 | Fill #1

## 2016-09-15 DIAGNOSIS — Z9889 Other specified postprocedural states: Secondary | ICD-10-CM | POA: Diagnosis not present

## 2016-09-18 DIAGNOSIS — J029 Acute pharyngitis, unspecified: Secondary | ICD-10-CM | POA: Diagnosis not present

## 2016-09-18 DIAGNOSIS — Z6827 Body mass index (BMI) 27.0-27.9, adult: Secondary | ICD-10-CM | POA: Diagnosis not present

## 2016-09-18 DIAGNOSIS — R112 Nausea with vomiting, unspecified: Secondary | ICD-10-CM | POA: Diagnosis not present

## 2016-09-18 MED FILL — ONDANSETRON ODT 4 MG TABLET: 4 | 7 days supply | Qty: 30 | Fill #0

## 2016-09-18 MED FILL — AMOX-CLAV 875-125 MG TABLET: 875-125 | 7 days supply | Qty: 14 | Fill #0

## 2016-09-28 MED FILL — LEVOTHYROXINE 112 MCG TAB: 112 | 30 days supply | Qty: 30 | Fill #2

## 2016-10-17 MED FILL — clonazePAM 1 MG TABS: 1 | 30 days supply | Qty: 60 | Fill #0

## 2016-10-26 MED FILL — LEVOTHYROXINE 112 MCG TAB: 112 | 30 days supply | Qty: 30 | Fill #3

## 2016-11-03 MED FILL — HYDROCHLOROTHIAZIDE 12.5 MG: 12.5 | 90 days supply | Qty: 90 | Fill #1

## 2016-11-14 MED FILL — clonazePAM 1 MG TABS: 1 | 30 days supply | Qty: 60 | Fill #1

## 2016-11-23 MED FILL — LEVOTHYROXINE 112 MCG TAB: 112 | 90 days supply | Qty: 90 | Fill #0

## 2016-12-18 MED FILL — clonazePAM 1 MG TABS: 1 | 30 days supply | Qty: 60 | Fill #2

## 2016-12-29 ENCOUNTER — Emergency Department (HOSPITAL_COMMUNITY): Payer: 59 | Admitting: Certified Registered Nurse Anesthetist

## 2016-12-29 ENCOUNTER — Inpatient Hospital Stay (HOSPITAL_COMMUNITY)
Admission: EM | Admit: 2016-12-29 | Discharge: 2016-12-31 | DRG: 340 | Disposition: A | Discharge status: Home / Self Care | Payer: 59 | Attending: General Surgery | Admitting: General Surgery

## 2016-12-29 ENCOUNTER — Encounter (HOSPITAL_COMMUNITY): Payer: Self-pay | Admitting: Emergency Medicine

## 2016-12-29 ENCOUNTER — Encounter (HOSPITAL_COMMUNITY): Admission: EM | Disposition: A | Discharge status: Home / Self Care | Payer: Self-pay | Source: Home / Self Care

## 2016-12-29 DIAGNOSIS — Z79891 Long term (current) use of opiate analgesic: Secondary | ICD-10-CM

## 2016-12-29 DIAGNOSIS — I340 Nonrheumatic mitral (valve) insufficiency: Secondary | ICD-10-CM | POA: Diagnosis present

## 2016-12-29 DIAGNOSIS — Z981 Arthrodesis status: Secondary | ICD-10-CM

## 2016-12-29 DIAGNOSIS — K353 Acute appendicitis with localized peritonitis, without perforation or gangrene: Secondary | ICD-10-CM

## 2016-12-29 DIAGNOSIS — K3533 Acute appendicitis with perforation and localized peritonitis, with abscess: Secondary | ICD-10-CM | POA: Diagnosis present

## 2016-12-29 DIAGNOSIS — Z800 Family history of malignant neoplasm of digestive organs: Secondary | ICD-10-CM

## 2016-12-29 DIAGNOSIS — N736 Female pelvic peritoneal adhesions (postinfective): Secondary | ICD-10-CM | POA: Diagnosis present

## 2016-12-29 DIAGNOSIS — R1031 Right lower quadrant pain: Secondary | ICD-10-CM | POA: Diagnosis not present

## 2016-12-29 DIAGNOSIS — Z8041 Family history of malignant neoplasm of ovary: Secondary | ICD-10-CM | POA: Diagnosis not present

## 2016-12-29 DIAGNOSIS — Z79899 Other long term (current) drug therapy: Secondary | ICD-10-CM

## 2016-12-29 DIAGNOSIS — I119 Hypertensive heart disease without heart failure: Secondary | ICD-10-CM | POA: Diagnosis present

## 2016-12-29 DIAGNOSIS — M1990 Unspecified osteoarthritis, unspecified site: Secondary | ICD-10-CM | POA: Diagnosis present

## 2016-12-29 DIAGNOSIS — Z90710 Acquired absence of both cervix and uterus: Secondary | ICD-10-CM | POA: Diagnosis not present

## 2016-12-29 DIAGNOSIS — K760 Fatty (change of) liver, not elsewhere classified: Secondary | ICD-10-CM | POA: Diagnosis not present

## 2016-12-29 DIAGNOSIS — E785 Hyperlipidemia, unspecified: Secondary | ICD-10-CM | POA: Diagnosis present

## 2016-12-29 DIAGNOSIS — Z7982 Long term (current) use of aspirin: Secondary | ICD-10-CM

## 2016-12-29 DIAGNOSIS — M810 Age-related osteoporosis without current pathological fracture: Secondary | ICD-10-CM | POA: Diagnosis present

## 2016-12-29 DIAGNOSIS — I10 Essential (primary) hypertension: Secondary | ICD-10-CM | POA: Diagnosis not present

## 2016-12-29 DIAGNOSIS — K352 Acute appendicitis with generalized peritonitis: Secondary | ICD-10-CM | POA: Diagnosis not present

## 2016-12-29 DIAGNOSIS — Z79810 Long term (current) use of selective estrogen receptor modulators (SERMs): Secondary | ICD-10-CM | POA: Diagnosis not present

## 2016-12-29 DIAGNOSIS — R52 Pain, unspecified: Secondary | ICD-10-CM | POA: Diagnosis not present

## 2016-12-29 DIAGNOSIS — Z882 Allergy status to sulfonamides status: Secondary | ICD-10-CM | POA: Diagnosis not present

## 2016-12-29 DIAGNOSIS — Z90722 Acquired absence of ovaries, bilateral: Secondary | ICD-10-CM

## 2016-12-29 DIAGNOSIS — E039 Hypothyroidism, unspecified: Secondary | ICD-10-CM | POA: Diagnosis not present

## 2016-12-29 DIAGNOSIS — Z6827 Body mass index (BMI) 27.0-27.9, adult: Secondary | ICD-10-CM | POA: Diagnosis not present

## 2016-12-29 DIAGNOSIS — R5383 Other fatigue: Secondary | ICD-10-CM | POA: Diagnosis not present

## 2016-12-29 DIAGNOSIS — Z8249 Family history of ischemic heart disease and other diseases of the circulatory system: Secondary | ICD-10-CM

## 2016-12-29 DIAGNOSIS — Z9049 Acquired absence of other specified parts of digestive tract: Secondary | ICD-10-CM | POA: Diagnosis not present

## 2016-12-29 DIAGNOSIS — K3580 Unspecified acute appendicitis: Secondary | ICD-10-CM | POA: Diagnosis not present

## 2016-12-29 DIAGNOSIS — R011 Cardiac murmur, unspecified: Secondary | ICD-10-CM | POA: Diagnosis present

## 2016-12-29 HISTORY — PX: LAPAROSCOPIC APPENDECTOMY: SHX408

## 2016-12-29 LAB — BASIC METABOLIC PANEL
ANION GAP: 13 (ref 5–15)
BUN: 11 mg/dL (ref 6–20)
CALCIUM: 9.1 mg/dL (ref 8.9–10.3)
CO2: 22 mmol/L (ref 22–32)
Chloride: 101 mmol/L (ref 101–111)
Creatinine, Ser: 0.81 mg/dL (ref 0.44–1.00)
GLUCOSE: 116 mg/dL — AB (ref 65–99)
POTASSIUM: 3.2 mmol/L — AB (ref 3.5–5.1)
Sodium: 136 mmol/L (ref 135–145)

## 2016-12-29 LAB — URINALYSIS, ROUTINE W REFLEX MICROSCOPIC
BACTERIA UA: NONE SEEN
Bilirubin Urine: NEGATIVE
GLUCOSE, UA: NEGATIVE mg/dL
KETONES UR: 20 mg/dL — AB
Leukocytes, UA: NEGATIVE
Nitrite: NEGATIVE
PH: 5 (ref 5.0–8.0)
Protein, ur: NEGATIVE mg/dL

## 2016-12-29 LAB — CBC
HCT: 36.8 % (ref 36.0–46.0)
Hemoglobin: 12.8 g/dL (ref 12.0–15.0)
MCH: 33.4 pg (ref 26.0–34.0)
MCHC: 34.8 g/dL (ref 30.0–36.0)
MCV: 96.1 fL (ref 78.0–100.0)
PLATELETS: 149 10*3/uL — AB (ref 150–400)
RBC: 3.83 MIL/uL — AB (ref 3.87–5.11)
RDW: 12.1 % (ref 11.5–15.5)
WBC: 12 10*3/uL — ABNORMAL HIGH (ref 4.0–10.5)

## 2016-12-29 SURGERY — APPENDECTOMY, LAPAROSCOPIC
Anesthesia: General

## 2016-12-29 MED ORDER — BUPIVACAINE HCL (PF) 0.25 % IJ SOLN
INTRAMUSCULAR | Status: DC | PRN
  Administered 2016-12-29: 26 mL

## 2016-12-29 MED ORDER — MORPHINE SULFATE (PF) 4 MG/ML IV SOLN: 2.0000 mg | INTRAVENOUS | Status: DC | PRN

## 2016-12-29 MED ORDER — SUGAMMADEX SODIUM 200 MG/2ML IV SOLN
INTRAVENOUS | Status: DC | PRN
  Administered 2016-12-29: 150 mg via INTRAVENOUS

## 2016-12-29 MED ORDER — ONDANSETRON HCL 4 MG/2ML IJ SOLN: 4.0000 mg | Freq: Four times a day (QID) | INTRAMUSCULAR | Status: DC | PRN

## 2016-12-29 MED ORDER — METRONIDAZOLE IN NACL 5-0.79 MG/ML-% IV SOLN
INTRAVENOUS | Status: AC
  Filled 2016-12-29: qty 100

## 2016-12-29 MED ORDER — LACTATED RINGERS IV SOLN: INTRAVENOUS | Status: DC

## 2016-12-29 MED ORDER — HYDROMORPHONE HCL 1 MG/ML IJ SOLN: 0.2500 mg | INTRAMUSCULAR | Status: DC | PRN

## 2016-12-29 MED ORDER — SODIUM CHLORIDE 0.9 % IV SOLN
Freq: Once | INTRAVENOUS | Status: AC
  Administered 2016-12-29: 20:00:00 via INTRAVENOUS

## 2016-12-29 MED ORDER — CLONAZEPAM 1 MG PO TABS
1.0000 mg | ORAL_TABLET | Freq: Every day | ORAL | Status: DC
  Administered 2016-12-29: 1 mg via ORAL
  Filled 2016-12-29: qty 1

## 2016-12-29 MED ORDER — ROCURONIUM BROMIDE 50 MG/5ML IV SOSY
PREFILLED_SYRINGE | INTRAVENOUS | Status: AC
  Filled 2016-12-29: qty 5

## 2016-12-29 MED ORDER — LACTATED RINGERS IV SOLN
INTRAVENOUS | Status: DC | PRN
  Administered 2016-12-29: 22:00:00 via INTRAVENOUS

## 2016-12-29 MED ORDER — CEFTRIAXONE SODIUM 2 G IJ SOLR
2.0000 g | Freq: Once | INTRAMUSCULAR | Status: AC
  Administered 2016-12-29: 2 g via INTRAVENOUS
  Filled 2016-12-29: qty 2

## 2016-12-29 MED ORDER — ONDANSETRON HCL 4 MG/2ML IJ SOLN
INTRAMUSCULAR | Status: AC
  Filled 2016-12-29: qty 2

## 2016-12-29 MED ORDER — FENTANYL CITRATE (PF) 100 MCG/2ML IJ SOLN
INTRAMUSCULAR | Status: AC
  Filled 2016-12-29: qty 2

## 2016-12-29 MED ORDER — FENTANYL CITRATE (PF) 100 MCG/2ML IJ SOLN
INTRAMUSCULAR | Status: DC | PRN
  Administered 2016-12-29: 25 ug via INTRAVENOUS
  Administered 2016-12-29 (×2): 50 ug via INTRAVENOUS

## 2016-12-29 MED ORDER — ACETAMINOPHEN 10 MG/ML IV SOLN
INTRAVENOUS | Status: DC | PRN
  Administered 2016-12-29: 1000 mg via INTRAVENOUS

## 2016-12-29 MED ORDER — SODIUM CHLORIDE 0.9 % IV SOLN: INTRAVENOUS | Status: DC

## 2016-12-29 MED ORDER — SUCCINYLCHOLINE CHLORIDE 200 MG/10ML IV SOSY
PREFILLED_SYRINGE | INTRAVENOUS | Status: DC | PRN
  Administered 2016-12-29: 110 mg via INTRAVENOUS

## 2016-12-29 MED ORDER — DEXAMETHASONE SODIUM PHOSPHATE 10 MG/ML IJ SOLN
INTRAMUSCULAR | Status: DC | PRN
  Administered 2016-12-29: 10 mg via INTRAVENOUS

## 2016-12-29 MED ORDER — KCL IN DEXTROSE-NACL 20-5-0.9 MEQ/L-%-% IV SOLN
INTRAVENOUS | Status: DC
  Administered 2016-12-29 – 2016-12-31 (×3): via INTRAVENOUS
  Filled 2016-12-29 (×3): qty 1000

## 2016-12-29 MED ORDER — LIDOCAINE 2% (20 MG/ML) 5 ML SYRINGE
INTRAMUSCULAR | Status: DC | PRN
  Administered 2016-12-29: 70 mg via INTRAVENOUS

## 2016-12-29 MED ORDER — ROCURONIUM BROMIDE 50 MG/5ML IV SOSY
PREFILLED_SYRINGE | INTRAVENOUS | Status: DC | PRN
  Administered 2016-12-29: 30 mg via INTRAVENOUS

## 2016-12-29 MED ORDER — MORPHINE SULFATE (PF) 4 MG/ML IV SOLN: 4.0000 mg | Freq: Once | INTRAVENOUS | Status: DC | PRN

## 2016-12-29 MED ORDER — LEVOTHYROXINE SODIUM 112 MCG PO TABS
112.0000 ug | ORAL_TABLET | Freq: Every day | ORAL | Status: DC
  Administered 2016-12-29 – 2016-12-30 (×2): 112 ug via ORAL
  Filled 2016-12-29 (×2): qty 1

## 2016-12-29 MED ORDER — RALOXIFENE HCL 60 MG PO TABS
60.0000 mg | ORAL_TABLET | Freq: Every day | ORAL | Status: DC
  Filled 2016-12-29 (×2): qty 1

## 2016-12-29 MED ORDER — SUGAMMADEX SODIUM 200 MG/2ML IV SOLN
INTRAVENOUS | Status: AC
  Filled 2016-12-29: qty 2

## 2016-12-29 MED ORDER — METRONIDAZOLE IN NACL 5-0.79 MG/ML-% IV SOLN
500.0000 mg | Freq: Once | INTRAVENOUS | Status: AC
  Administered 2016-12-29: 500 mg via INTRAVENOUS

## 2016-12-29 MED ORDER — HYDROCHLOROTHIAZIDE 12.5 MG PO CAPS
12.5000 mg | ORAL_CAPSULE | Freq: Every day | ORAL | Status: DC
  Administered 2016-12-30 – 2016-12-31 (×2): 12.5 mg via ORAL
  Filled 2016-12-29 (×2): qty 1

## 2016-12-29 MED ORDER — ONDANSETRON HCL 4 MG/2ML IJ SOLN
INTRAMUSCULAR | Status: DC | PRN
  Administered 2016-12-29: 4 mg via INTRAVENOUS

## 2016-12-29 MED ORDER — SODIUM CHLORIDE 0.9 % IV SOLN
INTRAVENOUS | Status: DC | PRN
  Administered 2016-12-29: 20:00:00 via INTRAVENOUS

## 2016-12-29 MED ORDER — PROPOFOL 10 MG/ML IV BOLUS
INTRAVENOUS | Status: DC | PRN
  Administered 2016-12-29: 130 mg via INTRAVENOUS

## 2016-12-29 MED ORDER — DEXAMETHASONE SODIUM PHOSPHATE 10 MG/ML IJ SOLN
INTRAMUSCULAR | Status: AC
  Filled 2016-12-29: qty 1

## 2016-12-29 MED ORDER — ONDANSETRON 4 MG PO TBDP: 4.0000 mg | ORAL_TABLET | Freq: Four times a day (QID) | ORAL | Status: DC | PRN

## 2016-12-29 MED ORDER — PROMETHAZINE HCL 25 MG/ML IJ SOLN: 6.2500 mg | INTRAMUSCULAR | Status: DC | PRN

## 2016-12-29 MED ORDER — ENOXAPARIN SODIUM 40 MG/0.4ML ~~LOC~~ SOLN
40.0000 mg | SUBCUTANEOUS | Status: DC
  Administered 2016-12-30 – 2016-12-31 (×2): 40 mg via SUBCUTANEOUS
  Filled 2016-12-29 (×2): qty 0.4

## 2016-12-29 MED ORDER — PANTOPRAZOLE SODIUM 40 MG PO TBEC: 40.0000 mg | DELAYED_RELEASE_TABLET | Freq: Every day | ORAL | Status: DC | PRN

## 2016-12-29 MED ORDER — SUCCINYLCHOLINE CHLORIDE 200 MG/10ML IV SOSY
PREFILLED_SYRINGE | INTRAVENOUS | Status: AC
  Filled 2016-12-29: qty 10

## 2016-12-29 MED ORDER — PIPERACILLIN-TAZOBACTAM 3.375 G IVPB
3.3750 g | Freq: Three times a day (TID) | INTRAVENOUS | Status: DC
  Administered 2016-12-30 – 2016-12-31 (×5): 3.375 g via INTRAVENOUS
  Filled 2016-12-29 (×6): qty 50

## 2016-12-29 MED ORDER — HYDROCODONE-ACETAMINOPHEN 5-325 MG PO TABS
1.0000 | ORAL_TABLET | ORAL | Status: DC | PRN
  Administered 2016-12-30 – 2016-12-31 (×5): 1 via ORAL
  Filled 2016-12-29 (×5): qty 1

## 2016-12-29 MED ORDER — ACETAMINOPHEN 10 MG/ML IV SOLN
INTRAVENOUS | Status: AC
  Filled 2016-12-29: qty 100

## 2016-12-29 MED ORDER — BUPIVACAINE HCL (PF) 0.25 % IJ SOLN
INTRAMUSCULAR | Status: AC
  Filled 2016-12-29: qty 30

## 2016-12-29 MED ORDER — LACTATED RINGERS IR SOLN
Status: DC | PRN
  Administered 2016-12-29: 1000 mL

## 2016-12-29 MED ORDER — LIDOCAINE 2% (20 MG/ML) 5 ML SYRINGE
INTRAMUSCULAR | Status: AC
  Filled 2016-12-29: qty 5

## 2016-12-29 MED ORDER — PROPOFOL 10 MG/ML IV BOLUS
INTRAVENOUS | Status: AC
  Filled 2016-12-29: qty 20

## 2016-12-29 MED ORDER — MEPERIDINE HCL 50 MG/ML IJ SOLN: 6.2500 mg | INTRAMUSCULAR | Status: DC | PRN

## 2016-12-29 SURGICAL SUPPLY — 40 items
ADH SKN CLS APL DERMABOND .7 (GAUZE/BANDAGES/DRESSINGS) ×1
APPLIER CLIP 5 13 M/L LIGAMAX5 (MISCELLANEOUS)
APPLIER CLIP ROT 10 11.4 M/L (STAPLE)
APR CLP MED LRG 11.4X10 (STAPLE)
APR CLP MED LRG 5 ANG JAW (MISCELLANEOUS)
BAG SPEC RTRVL LRG 6X4 10 (ENDOMECHANICALS) ×1
CABLE HIGH FREQUENCY MONO STRZ (ELECTRODE) ×1 IMPLANT
CHLORAPREP W/TINT 26ML (MISCELLANEOUS) ×2 IMPLANT
CLIP APPLIE 5 13 M/L LIGAMAX5 (MISCELLANEOUS) IMPLANT
CLIP APPLIE ROT 10 11.4 M/L (STAPLE) IMPLANT
COVER SURGICAL LIGHT HANDLE (MISCELLANEOUS) ×2 IMPLANT
CUTTER FLEX LINEAR 45M (STAPLE) ×1 IMPLANT
DECANTER SPIKE VIAL GLASS SM (MISCELLANEOUS) ×1 IMPLANT
DERMABOND ADVANCED (GAUZE/BANDAGES/DRESSINGS) ×1
DERMABOND ADVANCED .7 DNX12 (GAUZE/BANDAGES/DRESSINGS) IMPLANT
DRAPE LAPAROSCOPIC ABDOMINAL (DRAPES) ×2 IMPLANT
ELECT REM PT RETURN 15FT ADLT (MISCELLANEOUS) ×2 IMPLANT
GLOVE BIOGEL PI IND STRL 7.5 (GLOVE) ×1 IMPLANT
GLOVE BIOGEL PI INDICATOR 7.5 (GLOVE) ×1
GLOVE ECLIPSE 7.5 STRL STRAW (GLOVE) ×7 IMPLANT
GOWN STRL REUS W/TWL XL LVL3 (GOWN DISPOSABLE) ×5 IMPLANT
IRRIG SUCT STRYKERFLOW 2 WTIP (MISCELLANEOUS) ×2
IRRIGATION SUCT STRKRFLW 2 WTP (MISCELLANEOUS) ×1 IMPLANT
KIT BASIN OR (CUSTOM PROCEDURE TRAY) ×2 IMPLANT
PAD POSITIONING PINK XL (MISCELLANEOUS) ×2 IMPLANT
POUCH SPECIMEN RETRIEVAL 10MM (ENDOMECHANICALS) ×2 IMPLANT
RELOAD 45 VASCULAR/THIN (ENDOMECHANICALS) IMPLANT
RELOAD STAPLE 45 2.5 WHT GRN (ENDOMECHANICALS) IMPLANT
RELOAD STAPLE 45 3.5 BLU ETS (ENDOMECHANICALS) IMPLANT
RELOAD STAPLE TA45 3.5 REG BLU (ENDOMECHANICALS) ×2 IMPLANT
SCISSORS LAP 5X35 DISP (ENDOMECHANICALS) ×1 IMPLANT
SHEARS HARMONIC ACE PLUS 36CM (ENDOMECHANICALS) ×2 IMPLANT
SLEEVE XCEL OPT CAN 5 100 (ENDOMECHANICALS) ×2 IMPLANT
SUT MNCRL AB 4-0 PS2 18 (SUTURE) ×2 IMPLANT
TOWEL OR 17X26 10 PK STRL BLUE (TOWEL DISPOSABLE) ×2 IMPLANT
TRAY FOLEY W/METER SILVER 16FR (SET/KITS/TRAYS/PACK) ×1 IMPLANT
TRAY LAPAROSCOPIC (CUSTOM PROCEDURE TRAY) ×2 IMPLANT
TROCAR BLADELESS OPT 5 100 (ENDOMECHANICALS) ×2 IMPLANT
TROCAR XCEL BLUNT TIP 100MML (ENDOMECHANICALS) ×2 IMPLANT
TUBING INSUF HEATED (TUBING) ×2 IMPLANT

## 2016-12-29 NOTE — Anesthesia Preprocedure Evaluation (Signed)
Anesthesia Evaluation  Patient identified by MRN, date of birth, ID band Patient awake    Reviewed: Allergy & Precautions, H&P , NPO status , Patient's Chart, lab work & pertinent test results  Airway Mallampati: II   Neck ROM: full    Dental   Pulmonary neg pulmonary ROS,    breath sounds clear to auscultation       Cardiovascular hypertension,  Rhythm:regular Rate:Normal     Neuro/Psych  Headaches,    GI/Hepatic   Endo/Other  Hypothyroidism   Renal/GU      Musculoskeletal  (+) Arthritis ,   Abdominal   Peds  Hematology   Anesthesia Other Findings   Reproductive/Obstetrics                             Anesthesia Physical  Anesthesia Plan  ASA: II and emergent  Anesthesia Plan: General   Post-op Pain Management:    Induction: Intravenous, Rapid sequence and Cricoid pressure planned  Airway Management Planned: Oral ETT  Additional Equipment:   Intra-op Plan:   Post-operative Plan: Extubation in OR  Informed Consent: I have reviewed the patients History and Physical, chart, labs and discussed the procedure including the risks, benefits and alternatives for the proposed anesthesia with the patient or authorized representative who has indicated his/her understanding and acceptance.   Dental advisory given  Plan Discussed with: CRNA  Anesthesia Plan Comments:         Anesthesia Quick Evaluation

## 2016-12-29 NOTE — ED Notes (Signed)
Pt transferred to  OR.

## 2016-12-29 NOTE — Anesthesia Postprocedure Evaluation (Signed)
Anesthesia Post Note  Patient: Brittany Washington  Procedure(s) Performed: Procedure(s) (LRB): APPENDECTOMY LAPAROSCOPIC (N/A)  Patient location during evaluation: PACU Anesthesia Type: General Level of consciousness: sedated and patient cooperative Pain management: pain level controlled Vital Signs Assessment: post-procedure vital signs reviewed and stable Respiratory status: spontaneous breathing Cardiovascular status: stable Anesthetic complications: no       Last Vitals:  Vitals:   12/29/16 2200 12/29/16 2215  BP: 131/71 118/73  Pulse: 76 77  Resp: (!) 21 13  Temp:      Last Pain:  Vitals:   12/29/16 1846  TempSrc: Oral  PainSc:                  [name]

## 2016-12-29 NOTE — ED Notes (Signed)
This Probation officer spoke with [place] MD who verbalizes en route to see pt.

## 2016-12-29 NOTE — Anesthesia Procedure Notes (Signed)
Procedure Name: Intubation Date/Time: 12/29/2016 8:35 PM Performed by: Montel Clock Pre-anesthesia Checklist: Patient identified, Emergency Drugs available, Suction available, Patient being monitored and Timeout performed Patient Re-evaluated:Patient Re-evaluated prior to inductionOxygen Delivery Method: Circle system utilized Preoxygenation: Pre-oxygenation with 100% oxygen Intubation Type: IV induction, Cricoid Pressure applied and Rapid sequence Laryngoscope Size: Mac and 3 Grade View: Grade I Tube type: Oral Tube size: 7.0 mm Number of attempts: 1 Airway Equipment and Method: Stylet Placement Confirmation: ETT inserted through vocal cords under direct vision,  positive ETCO2 and breath sounds checked- equal and bilateral Secured at: 21 cm Tube secured with: Tape Dental Injury: Teeth and Oropharynx as per pre-operative assessment

## 2016-12-29 NOTE — Op Note (Signed)
Preoperative Diagnosis: Acute appendicitis with localized peritonitis [K35.3]  Postoprative Diagnosis: Acute appendicitis with perforation and abscess  Procedure: Procedure(s): APPENDECTOMY LAPAROSCOPIC   Surgeon: Excell Seltzer T   Assistants: None  Anesthesia:  General endotracheal anesthesia  Indications: Patient is a 66 year old female with 36 hours of progressive mid abdominal and then right lower quadrant abdominal pain with fever. CT scan has shown evidence of acute appendicitis with a possible small perforation at the tip. After preoperative discussion regarding surgery and risks and indications detailed elsewhere we have elected to proceed with emergency laparoscopic appendectomy.    Procedure Detail: The patient received preoperative broad-spectrum IV antibiotics. She was taken to the operating room, placed in the supine position on the operating table, and general endotracheal anesthesia induced. Foley catheter was placed. PAS replaced. The abdomen was widely sterilely prepped and draped. Patient timeout was performed and correct procedure verified. Trocar sites were infiltrated with local anesthesia. Patient had a previous infraumbilical laparoscopic port incision which was used opening it for 1 cm and dissection was carried down the midline fascia which was incised for 1 cm the peritoneum entered under direct vision. Through a mattress suture of 0 Vicryl Trocar Was Placed and Pneumoperitoneum Established. Under Direct Vision 5 Mm Trochars Were Placed in the Left Lower Quadrant and the Upper Midline. There was an obvious inflammatory process in the right lower quadrant. The base of the appendix was identified following the tinea and several adherent loops of ileum were carefully bluntly dissected away from the appendix which was markedly acutely inflamed. As the tip was exposed and a small abscess was opened and this was completely evacuated. There was a small perforation at the tip  of the appendix. Further inflammatory adhesions of the appendix to the rectosigmoid and the right lateral pelvic side wall were carefully taken down with blunt dissection and then the appendix and mesoappendix was able to be elevated and completely exposed. The base of the appendix was relatively uninflamed. Lateral peritoneal attachments were divided mobilizing the tip of the cecum and the appendix. The mesial appendix was then sequentially divided with the Harmonic scalpel until the appendix was completely freed down to its base and the tip of the cecum. The appendix was divided across the tip of the cecum with a single firing of the Endo GIA 45 mm blue load stapler. The appendix was placed in Endo Catch bag and brought out through the umbilical incision. There were some minor bleeding points along the staple line and mesoappendix that were controlled. The right lower quadrant was thoroughly irrigated with saline until clear. All inflammatory adhesions broken up. There was no bleeding or evidence of injury or other problems. All CO2 was evacuated and trochars removed and the mattress suture secured the umbilicus. Skin incisions were closed with subcuticular Monocryl and Dermabond. Sponge needle and instrument counts were correct.   Findings: Acute appendicitis with perforation and small abscess  Estimated Blood Loss:  Minimal         Drains: None  Blood Given: none          Specimens: Appendix        Complications:  * No complications entered in OR log *         Disposition: PACU - hemodynamically stable.         Condition: stable

## 2016-12-29 NOTE — ED Notes (Signed)
RN starting line and collecting blood work 

## 2016-12-29 NOTE — H&P (Signed)
Brittany Washington is an 66 y.o. female.   Chief Complaint: Abdominal pain  HPI: Patient is a generally healthy 66 year old female who 36 hours ago developed vague crampy mid abdominal pain and nausea. This continued and that evening she felt feverish and chilled. Today the pain became more localized to the right lower quadrant and constant. Worse with any motion. She remains nauseated without vomiting. No GU symptoms. Normal bowel movements. No history of any chronic GI complaints. She saw her primary physician and an outpatient CT scan was obtained. This was reviewed by phone which shows apparent acute appendicitis with no abscess but a possible small perforation at the tip. White blood count was elevated at over 12,000 at her primary office.  Past Medical History:  Diagnosis Date  . Arthritis   . Benign hypertensive heart disease without heart failure   . Dyslipidemia    MILD  . Heart murmur, systolic    MECHNICAL  . Hypertension    mild, lost weight, no meds now  . Hypothyroidism   . Osteoporosis     Past Surgical History:  Procedure Laterality Date  . COLONOSCOPY    . HAMMER TOE SURGERY Left 07/14/2013   Procedure: HAMMER TOE CORRECTION LEFT SECOND TOE, AUSTIN/AKIN LEFT;  Surgeon: [name], DPM;  Location: [facility];  Service: Podiatry;  Laterality: Left;  . LAPAROSCOPIC CHOLECYSTECTOMY  1995  . LUMBAR DISKECTOMY W/ DISSECTION, LEFT L4 -- L5  02-23-2004  . METATARSAL OSTEOTOMY Left 04/02/2015   Procedure: LEFT FOOT METATARSAL OSTEOTOMY;  Surgeon: [name], MD;  Location: [facility];  Service: Orthopedics;  Laterality: Left;  . METATARSAL OSTEOTOMY Left 08/18/2016   Procedure: METATARSAL OSTEOTOMY SECOND TOE;  Surgeon: [name], MD;  Location: [facility];  Service: Orthopedics;  Laterality: Left;  . ORIF LEFT DISTAL RADIAL FX W/ BONE GRAFT  01-14-2009  . POSTERIOR LUMBAR FUSION  05-16-2005   L4 -- L5  . RE-DO LUMBAR  DISKECTOMY , L4  - L5  05-03-2004  . TRIGGER FINGER RELEASE  01/09/2012   Procedure: RELEASE TRIGGER FINGER/A-1 PULLEY;  Surgeon: [name], MD;  Location: [facility];  Service: Orthopedics;  Laterality: Left;  Marland Kitchen VAGINAL HYSTERECTOMY  1993   W/ BILATERAL SALPINGOOPHORECTOMY  . WRIST FRACTURE SURGERY     left    Family History  Problem Relation Age of Onset  . Heart attack Father   . Colon cancer Father   . Heart attack Brother   . Ovarian cancer Mother   . Rectal cancer Neg Hx   . Stomach cancer Neg Hx   . Breast cancer Neg Hx    Social History:  reports that she has never smoked. She has never used smokeless tobacco. She reports that she drinks about 4.2 oz of alcohol per week . She reports that she does not use drugs.  Allergies:  Allergies  Allergen Reactions  . Sulfa Antibiotics Other (See Comments)    Reaction:  Body aches     Current Facility-Administered Medications  Medication Dose Route Frequency Provider Last Rate Last Dose  . 0.9 %  sodium chloride infusion   Intravenous Once Forde Dandy, MD      . cefTRIAXone (ROCEPHIN) 2 g in dextrose 5 % 50 mL IVPB  2 g Intravenous Once Forde Dandy, MD       And  . metroNIDAZOLE (FLAGYL) IVPB 500 mg  500 mg Intravenous Once Forde Dandy, MD      .  morphine 4 MG/ML injection 4 mg  4 mg Intravenous Once PRN Forde Dandy, MD       Current Outpatient Prescriptions  Medication Sig Dispense Refill  . aspirin EC 81 MG tablet Take 81 mg by mouth 2 (two) times a week.    . cetirizine (ZYRTEC) 10 MG tablet Take 10 mg by mouth at bedtime.     . clonazePAM (KLONOPIN) 1 MG tablet Take 1 tablet (1 mg total) by mouth at bedtime. Take 1-2 tablets by mouth daily at bedtime as needed for sleep. (Patient taking differently: Take 1-2 mg by mouth at bedtime as needed (for sleep). ) 15 tablet 0  . hydrochlorothiazide (MICROZIDE) 12.5 MG capsule TAKE 1 CAPSULE BY MOUTH DAILY. 30 capsule 9  . HYDROcodone-acetaminophen  (NORCO/VICODIN) 5-325 MG tablet Take 0.5-1 tablets by mouth every 6 (six) hours as needed (for back pain).     Marland Kitchen levothyroxine (SYNTHROID, LEVOTHROID) 112 MCG tablet TAKE 1 TABLET BY MOUTH ONCE DAILY (Patient taking differently: Take 1 tablet by mouth at bedtime) 90 tablet PRN  . pantoprazole (PROTONIX) 40 MG tablet Take 40 mg by mouth daily as needed (for acid reflux).     . raloxifene (EVISTA) 60 MG tablet Take 60 mg by mouth daily.       Results for orders placed or performed during the hospital encounter of 12/29/16 (from the past 48 hour(s))  CBC     Status: Abnormal   Collection Time: 12/29/16  7:13 PM  Result Value Ref Range   WBC 12.0 (H) 4.0 - 10.5 K/uL   RBC 3.83 (L) 3.87 - 5.11 MIL/uL   Hemoglobin 12.8 12.0 - 15.0 g/dL   HCT 36.8 36.0 - 46.0 %   MCV 96.1 78.0 - 100.0 fL   MCH 33.4 26.0 - 34.0 pg   MCHC 34.8 30.0 - 36.0 g/dL   RDW 12.1 11.5 - 15.5 %   Platelets 149 (L) 150 - 400 K/uL   No results found.  Review of Systems  Constitutional: Positive for chills and fever.  Respiratory: Negative.   Cardiovascular: Negative.   Gastrointestinal: Positive for abdominal pain and nausea. Negative for blood in stool, constipation and vomiting.  Genitourinary: Negative.     Blood pressure (!) 152/94, pulse (!) 101, temperature 98.3 F (36.8 C), temperature source Oral, resp. rate 16, weight 69.4 kg (153 lb), SpO2 98 %. Physical Exam  General: Alert, well-developed Caucasian female, in no distress Skin: Warm and dry without rash or infection. HEENT: No palpable masses or thyromegaly. Sclera nonicteric. Pupils equal round and reactive. Lymph nodes: No cervical, supraclavicular, or inguinal nodes palpable. Lungs: Breath sounds clear and equal without increased work of breathing Cardiovascular: Regular rate and rhythm. No JVD or edema. Peripheral pulses intact. Abdomen: Nondistended. Hypoactive bowel sounds. Localized moderate right lower quadrant tenderness with minimal guarding  and no peritoneal signs.. No masses palpable. No organomegaly. No palpable hernias. Extremities: No edema or joint swelling or deformity. No chronic venous stasis changes. Neurologic: Alert and fully oriented. Affect normal. No gross motor deficits. . Assessment/Plan History and physical findings consistent with acute appendicitis which has been confirmed by CT scan. Current lab work here pending although leukocytosis noted at her primary office. This was all discussed with the patient and her husband. I recommended proceeding with laparoscopic appendectomy. We discussed the nature of the surgery and indications of expected recovery as well as risks of anesthetic complications, bleeding, infection or open surgery. She is receiving preoperative broad-spectrum IV antibiotics.  Edward Jolly, MD 12/29/2016, 7:30 PM

## 2016-12-29 NOTE — ED Provider Notes (Signed)
[facility] DEPT Provider Note   CSN: [id number] Arrival date & time: 12/29/16  1962     History   Chief Complaint Chief Complaint  Patient presents with  . Appendicitis    HPI Brittany Washington is a 66 y.o. female.  HPI  66 year old female who presents with abdominal pain. She has a history of vaginal hysterectomy and laparoscopic cholecystectomy. States that yesterday evening began to develop generalized aching and numbness in her abdomen. Pain subsequently began to localize to her right lower quadrant. She has not had nausea, vomiting, diarrhea, dysuria. Notes some urinary frequency, generalized achiness, subjective fevers and chills. Did have outpatient CT scan of the abdomen and pelvis through [facility], with signs of an acute appendicitis. Patient did speak with Dr. Excell Seltzer who directed her to come to the ED for evaluation.   Past Medical History:  Diagnosis Date  . Arthritis   . Benign hypertensive heart disease without heart failure   . Dyslipidemia    MILD  . Heart murmur, systolic    MECHNICAL  . Hypertension    mild, lost weight, no meds now  . Hypothyroidism   . Osteoporosis     Patient Active Problem List   Diagnosis Date Noted  . Mitral regurgitation 01/24/2016  . Stroke-like symptoms 06/25/2015  . Headache 06/25/2015  . Hypertension 06/25/2015  . Expressive aphasia   . Cough 06/25/2014  . Multiple respiratory allergies 03/11/2014  . Other abnormal blood chemistry 06/13/2011  . Benign hypertensive heart disease without heart failure 06/13/2011  . Hypothyroid 06/13/2011  . Heart murmur 06/13/2011    Past Surgical History:  Procedure Laterality Date  . COLONOSCOPY    . HAMMER TOE SURGERY Left 07/14/2013   Procedure: HAMMER TOE CORRECTION LEFT SECOND TOE, AUSTIN/AKIN LEFT;  Surgeon: [name], DPM;  Location: [facility];  Service: Podiatry;  Laterality: Left;  . LAPAROSCOPIC CHOLECYSTECTOMY  1995  . LUMBAR DISKECTOMY W/  DISSECTION, LEFT L4 -- L5  02-23-2004  . METATARSAL OSTEOTOMY Left 04/02/2015   Procedure: LEFT FOOT METATARSAL OSTEOTOMY;  Surgeon: [name], MD;  Location: [facility];  Service: Orthopedics;  Laterality: Left;  . METATARSAL OSTEOTOMY Left 08/18/2016   Procedure: METATARSAL OSTEOTOMY SECOND TOE;  Surgeon: [name], MD;  Location: [facility];  Service: Orthopedics;  Laterality: Left;  . ORIF LEFT DISTAL RADIAL FX W/ BONE GRAFT  01-14-2009  . POSTERIOR LUMBAR FUSION  05-16-2005   L4 -- L5  . RE-DO LUMBAR DISKECTOMY , L4  - L5  05-03-2004  . TRIGGER FINGER RELEASE  01/09/2012   Procedure: RELEASE TRIGGER FINGER/A-1 PULLEY;  Surgeon: [name], MD;  Location: [facility];  Service: Orthopedics;  Laterality: Left;  Marland Kitchen VAGINAL HYSTERECTOMY  1993   W/ BILATERAL SALPINGOOPHORECTOMY  . WRIST FRACTURE SURGERY     left    OB History    No data available       Home Medications    Prior to Admission medications   Medication Sig Start Date End Date Taking? Authorizing Provider  aspirin EC 81 MG tablet Take 81 mg by mouth 2 (two) times a week.   Yes Historical Provider, MD  cetirizine (ZYRTEC) 10 MG tablet Take 10 mg by mouth at bedtime.    Yes Historical Provider, MD  clonazePAM (KLONOPIN) 1 MG tablet Take 1 tablet (1 mg total) by mouth at bedtime. Take 1-2 tablets by mouth daily at bedtime as needed for sleep. Patient taking differently:  Take 1-2 mg by mouth at bedtime as needed (for sleep).  12/20/15  Yes Thayer Headings, MD  hydrochlorothiazide (MICROZIDE) 12.5 MG capsule TAKE 1 CAPSULE BY MOUTH DAILY. 09/21/15  Yes Darlin Coco, MD  HYDROcodone-acetaminophen (NORCO/VICODIN) 5-325 MG tablet Take 0.5-1 tablets by mouth every 6 (six) hours as needed (for back pain).    Yes Historical Provider, MD  levothyroxine (SYNTHROID, LEVOTHROID) 112 MCG tablet TAKE 1 TABLET BY MOUTH ONCE DAILY Patient taking differently: Take 1 tablet by  mouth at bedtime 10/19/14  Yes Lendon Colonel, NP  pantoprazole (PROTONIX) 40 MG tablet Take 40 mg by mouth daily as needed (for acid reflux).    Yes Historical Provider, MD  raloxifene (EVISTA) 60 MG tablet Take 60 mg by mouth daily.   Yes Historical Provider, MD    Family History Family History  Problem Relation Age of Onset  . Heart attack Father   . Colon cancer Father   . Heart attack Brother   . Ovarian cancer Mother   . Rectal cancer Neg Hx   . Stomach cancer Neg Hx   . Breast cancer Neg Hx     Social History Social History  Substance Use Topics  . Smoking status: Never Smoker  . Smokeless tobacco: Never Used  . Alcohol use 4.2 oz/week    7 Glasses of wine per week     Comment: 1 WINE DAILY     Allergies   Sulfa antibiotics   Review of Systems Review of Systems  Constitutional: Positive for fever.  Respiratory: Negative for shortness of breath.   Cardiovascular: Negative for chest pain.  Gastrointestinal: Positive for abdominal pain.  Genitourinary: Positive for frequency.  Allergic/Immunologic: Negative for immunocompromised state.  Hematological: Does not bruise/bleed easily.  All other systems reviewed and are negative.    Physical Exam Updated Vital Signs BP (!) 152/94 (BP Location: Right Arm)   Pulse (!) 101   Temp 98.3 F (36.8 C) (Oral)   Resp 16   Wt 153 lb (69.4 kg)   SpO2 98%   BMI 26.26 kg/m   Physical Exam Physical Exam  Nursing note and vitals reviewed. Constitutional: Well developed, well nourished, non-toxic, and in no acute distress Head: Normocephalic and atraumatic.  Mouth/Throat: Oropharynx is clear and moist.  Neck: Normal range of motion. Neck supple.  Cardiovascular: Normal rate and regular rhythm.   Pulmonary/Chest: Effort normal and breath sounds normal.  Abdominal: Soft. There is right lower quadrant tenderness. There is no rebound and no guarding.  Musculoskeletal: Normal range of motion.  Neurological: Alert, no  facial droop, fluent speech, moves all extremities symmetrically Skin: Skin is warm and dry.  Psychiatric: Cooperative   ED Treatments / Results  Labs (all labs ordered are listed, but only abnormal results are displayed) Labs Reviewed  CBC - Abnormal; Notable for the following:       Result Value   WBC 12.0 (*)    RBC 3.83 (*)    Platelets 149 (*)    All other components within normal limits  BASIC METABOLIC PANEL  URINALYSIS, ROUTINE W REFLEX MICROSCOPIC    EKG  EKG Interpretation None       Radiology No results found.  Procedures Procedures (including critical care time)  Medications Ordered in ED Medications  cefTRIAXone (ROCEPHIN) 2 g in dextrose 5 % 50 mL IVPB (not administered)    And  metroNIDAZOLE (FLAGYL) IVPB 500 mg (not administered)  0.9 %  sodium chloride infusion (not administered)  morphine  4 MG/ML injection 4 mg (not administered)     Initial Impression / Assessment and Plan / ED Course  I have reviewed the triage vital signs and the nursing notes.  Pertinent labs & imaging results that were available during my care of the patient were reviewed by me and considered in my medical decision making (see chart for details).     Patient with acute appendicitis on outpatient CT scan. Dr. Lear Ng consultated and will admit patient for surgical management.  Final Clinical Impressions(s) / ED Diagnoses   Final diagnoses:  Acute appendicitis with localized peritonitis    New Prescriptions New Prescriptions   No medications on file     Forde Dandy, MD 12/29/16 1930

## 2016-12-29 NOTE — ED Triage Notes (Addendum)
Pt to see [facility] MD related to confirmed appendicitis. Hoxworth MD request verbal orders to be placed and to be paged on pt arrival. Verbal orders placed CBC, BMP, and UA. Hoxworth MD paged.

## 2016-12-29 NOTE — Transfer of Care (Signed)
Immediate Anesthesia Transfer of Care Note  Patient: Brittany Washington  Procedure(s) Performed: Procedure(s): APPENDECTOMY LAPAROSCOPIC (N/A)  Patient Location: PACU  Anesthesia Type:General  Level of Consciousness:  sedated, patient cooperative and responds to stimulation  Airway & Oxygen Therapy:Patient Spontanous Breathing and Patient connected to face mask oxgen  Post-op Assessment:  Report given to PACU RN and Post -op Vital signs reviewed and stable  Post vital signs:  Reviewed and stable  Last Vitals:  Vitals:   12/29/16 2130 12/29/16 2141  BP:    Pulse:  88  Resp:  14  Temp: 65.8 C     Complications: No apparent anesthesia complications

## 2016-12-30 LAB — CBC
HEMATOCRIT: 37.1 % (ref 36.0–46.0)
HEMOGLOBIN: 12.5 g/dL (ref 12.0–15.0)
MCH: 32.6 pg (ref 26.0–34.0)
MCHC: 33.7 g/dL (ref 30.0–36.0)
MCV: 96.6 fL (ref 78.0–100.0)
PLATELETS: 152 10*3/uL (ref 150–400)
RBC: 3.84 MIL/uL — AB (ref 3.87–5.11)
RDW: 12 % (ref 11.5–15.5)
WBC: 13 10*3/uL — ABNORMAL HIGH (ref 4.0–10.5)

## 2016-12-30 LAB — BASIC METABOLIC PANEL
Anion gap: 7 (ref 5–15)
BUN: 9 mg/dL (ref 6–20)
CALCIUM: 8.6 mg/dL — AB (ref 8.9–10.3)
CO2: 23 mmol/L (ref 22–32)
Chloride: 106 mmol/L (ref 101–111)
Creatinine, Ser: 0.8 mg/dL (ref 0.44–1.00)
GFR calc Af Amer: >60 mL/min (ref >60)
GLUCOSE: 202 mg/dL — AB (ref 65–99)
Potassium: 3.7 mmol/L (ref 3.5–5.1)
Sodium: 136 mmol/L (ref 135–145)

## 2016-12-30 MED ORDER — DIPHENHYDRAMINE HCL 25 MG PO CAPS
25.0000 mg | ORAL_CAPSULE | Freq: Once | ORAL | Status: AC
Start: 2016-12-30 — End: 2016-12-30
  Administered 2016-12-30: 25 mg via ORAL
  Filled 2016-12-30: qty 1

## 2016-12-30 MED ORDER — CLONAZEPAM 1 MG PO TABS
2.0000 mg | ORAL_TABLET | Freq: Every day | ORAL | Status: DC
  Administered 2016-12-30: 2 mg via ORAL
  Filled 2016-12-30: qty 2

## 2016-12-30 MED ORDER — LORATADINE 10 MG PO TABS
10.0000 mg | ORAL_TABLET | Freq: Every day | ORAL | Status: DC
  Administered 2016-12-30: 10 mg via ORAL
  Filled 2016-12-30 (×2): qty 1

## 2016-12-30 NOTE — Progress Notes (Signed)
[facility] Office:  [phone] General Surgery Progress Note   LOS: 1 day  POD -  1 Day Post-Op  Chief Complaint: Abdominal pain  Assessment and Plan: 1.  APPENDECTOMY LAPAROSCOPIC - 12/29/2016 - [facility]  for perforated appendicitis  WBC - 13,000 - 12/30/2016  On Zosyn - 4/27 >>>  Looks good - will keep at least until tomorrow because of perforated appendix.  Will check CBC again in AM.  Will advance diet as tolerated.  2.  DVT prophylaxis - Lovenox  Active Problems:   Acute appendicitis with perforation and peritoneal abscess   Subjective:  Doing well.  Has only had clear liquids so far.  Objective:   Vitals:   12/30/16 0525 12/30/16 0528  BP: 119/70 (!) 107/55  Pulse: 76 93  Resp: 18 18  Temp: 98.3 F (36.8 C) 98.5 F (36.9 C)     Intake/Output from previous day:  04/27 0701 - 04/28 0700 In: 1850 [I.V.:1800; IV Piggyback:50] Out: 160 [Urine:150; Blood:10]  Intake/Output this shift:  Total I/O In: 675 [P.O.:240; I.V.:435] Out: -    Physical Exam:   General: WN WF who is alert and oriented.    HEENT: Normal. Pupils equal. .   Lungs: Clear   Abdomen: Soft, has a few BS.   Wound: Clean  Lab Results:    Recent Labs  12/29/16 1913 12/30/16 0613  WBC 12.0* 13.0*  HGB 12.8 12.5  HCT 36.8 37.1  PLT 149* 152    BMET   Recent Labs  12/29/16 1913 12/30/16 0613  NA 136 136  K 3.2* 3.7  CL 101 106  CO2 22 23  GLUCOSE 116* 202*  BUN 11 9  CREATININE 0.81 0.80  CALCIUM 9.1 8.6*    PT/INR  No results for input(s): LABPROT, INR in the last 72 hours.  ABG  No results for input(s): PHART, HCO3 in the last 72 hours.  Invalid input(s): PCO2, PO2   Studies/Results:  No results found.   Anti-infectives:   Anti-infectives    Start     Dose/Rate Route Frequency Ordered Stop   12/29/16 2300  piperacillin-tazobactam (ZOSYN) IVPB 3.375 g     3.375 g 12.5 mL/hr over 240 Minutes Intravenous Every 8 hours 12/29/16 2256     12/29/16 2011   metroNIDAZOLE (FLAGYL) 5-0.79 MG/ML-% IVPB    Comments:  Enrigue Catena   : cabinet override      12/29/16 2011 12/29/16 2019   12/29/16 1915  cefTRIAXone (ROCEPHIN) 2 g in dextrose 5 % 50 mL IVPB     2 g 100 mL/hr over 30 Minutes Intravenous  Once 12/29/16 1904 12/29/16 2007   12/29/16 1915  metroNIDAZOLE (FLAGYL) IVPB 500 mg     500 mg 100 mL/hr over 60 Minutes Intravenous  Once 12/29/16 1904 12/29/16 2049      Alphonsa Overall, MD, FACS Pager: [facility] Office: [phone] 12/30/2016

## 2016-12-31 LAB — CBC
HEMATOCRIT: 34.5 % — AB (ref 36.0–46.0)
Hemoglobin: 11.4 g/dL — ABNORMAL LOW (ref 12.0–15.0)
MCH: 32.4 pg (ref 26.0–34.0)
MCHC: 33 g/dL (ref 30.0–36.0)
MCV: 98 fL (ref 78.0–100.0)
PLATELETS: 151 10*3/uL (ref 150–400)
RBC: 3.52 MIL/uL — ABNORMAL LOW (ref 3.87–5.11)
RDW: 11.9 % (ref 11.5–15.5)
WBC: 10.8 10*3/uL — AB (ref 4.0–10.5)

## 2016-12-31 MED ORDER — AMOXICILLIN-POT CLAVULANATE 875-125 MG PO TABS: 1.0000 | ORAL_TABLET | Freq: Two times a day (BID) | ORAL | 3 refills | Status: DC

## 2016-12-31 MED ORDER — HYDROCODONE-ACETAMINOPHEN 5-325 MG PO TABS: 1.0000 | ORAL_TABLET | ORAL | 0 refills | Status: DC | PRN

## 2016-12-31 NOTE — Discharge Summary (Signed)
  Patient ID: Brittany Washington [id number] 66 y.o. Dec 27, 1950  12/29/2016  Discharge date and time: 12/31/2016   Admitting Physician: Excell Seltzer T  Discharge Physician: Excell Seltzer T  Admission Diagnoses: Acute appendicitis with localized peritonitis [K35.3]  Discharge Diagnoses: Same  Operations: Procedure(s): APPENDECTOMY LAPAROSCOPIC  Admission Condition: fair  Discharged Condition: good  Indication for Admission: Patient presents with 2 days of generalized and then right lower quadrant abdominal pain with nausea. She had localized tenderness and elevated white count and outpatient CT scan showed acute appendicitis with possible small perforation at the tip.  Hospital Course: Patient was admitted for emergency appendectomy. She was given broad-spectrum IV antibiotics. She was taken to the operating room and underwent laparoscopic appendectomy with findings of acute appendicitis with a localized perforation and small abscess. Postoperatively she was maintained on IV Zosyn. On the first postoperative day she was improved with mildly elevated white count. On the second postoperative day pain is much improved. Tolerating diet. White blood count is near normal. She is nontender. Felt ready for discharge at this time.   Disposition: Home  Patient Instructions:  Allergies as of 12/31/2016      Reactions   Sulfa Antibiotics Other (See Comments)   Reaction:  Body aches       Medication List    TAKE these medications   amoxicillin-clavulanate 875-125 MG tablet Commonly known as:  AUGMENTIN Take 1 tablet by mouth 2 (two) times daily.   aspirin EC 81 MG tablet Take 81 mg by mouth 2 (two) times a week.   cetirizine 10 MG tablet Commonly known as:  ZYRTEC Take 10 mg by mouth at bedtime.   clonazePAM 1 MG tablet Commonly known as:  KLONOPIN Take 1 tablet (1 mg total) by mouth at bedtime. Take 1-2 tablets by mouth daily at bedtime as needed for sleep. What  changed:  how much to take  when to take this  reasons to take this  additional instructions   hydrochlorothiazide 12.5 MG capsule Commonly known as:  MICROZIDE TAKE 1 CAPSULE BY MOUTH DAILY.   HYDROcodone-acetaminophen 5-325 MG tablet Commonly known as:  NORCO/VICODIN Take 1 tablet by mouth every 4 (four) hours as needed (pain). What changed:  how much to take  when to take this  reasons to take this   levothyroxine 112 MCG tablet Commonly known as:  SYNTHROID, LEVOTHROID TAKE 1 TABLET BY MOUTH ONCE DAILY What changed:  See the new instructions.   pantoprazole 40 MG tablet Commonly known as:  PROTONIX Take 40 mg by mouth daily as needed (for acid reflux).   raloxifene 60 MG tablet Commonly known as:  EVISTA Take 60 mg by mouth daily.       Activity: activity as tolerated Diet: regular diet Wound Care: none needed  Follow-up:  With Dr. Excell Seltzer in 2 weeks.  Signed: Edward Jolly MD, FACS  12/31/2016, 8:58 AM

## 2016-12-31 NOTE — Progress Notes (Signed)
Discussed with patient discharge instructions, she verbalized agreement and understanding.  Patient to go home with all personal belongings in private vehicle.

## 2016-12-31 NOTE — Progress Notes (Signed)
Patient ID: Brittany Washington, female   DOB: [birth year], 66 y.o.   MRN: [id number] 2 Days Post-Op   Subjective: Feels better today. Denies pain. Tolerating diet.  Objective: Vital signs in last 24 hours: Temp:  [97.4 F (36.3 C)-98.2 F (36.8 C)] 98.2 F (36.8 C) (04/29 0529) Pulse Rate:  [63-77] 63 (04/29 0529) Resp:  [16] 16 (04/28 2131) BP: (113-152)/(73-90) 131/73 (04/29 0529) SpO2:  [95 %-98 %] 95 % (04/29 0529) Last BM Date: 12/30/16  Intake/Output from previous day: 04/28 0701 - 04/29 0700 In: 2429.6 [P.O.:720; I.V.:1609.6; IV Piggyback:100] Out: -  Intake/Output this shift: No intake/output data recorded.  General appearance: alert, cooperative and no distress GI: Minimal if any right lower quadrant tenderness Incision/Wound: Clean and dry  Lab Results:   Recent Labs  12/30/16 0613 12/31/16 0617  WBC 13.0* 10.8*  HGB 12.5 11.4*  HCT 37.1 34.5*  PLT 152 151   BMET  Recent Labs  12/29/16 1913 12/30/16 0613  NA 136 136  K 3.2* 3.7  CL 101 106  CO2 22 23  GLUCOSE 116* 202*  BUN 11 9  CREATININE 0.81 0.80  CALCIUM 9.1 8.6*     Studies/Results: No results found.  Anti-infectives: Anti-infectives    Start     Dose/Rate Route Frequency Ordered Stop   12/29/16 2300  piperacillin-tazobactam (ZOSYN) IVPB 3.375 g     3.375 g 12.5 mL/hr over 240 Minutes Intravenous Every 8 hours 12/29/16 2256     12/29/16 2011  metroNIDAZOLE (FLAGYL) 5-0.79 MG/ML-% IVPB    Comments:  Enrigue Catena   : cabinet override      12/29/16 2011 12/29/16 2019   12/29/16 1915  cefTRIAXone (ROCEPHIN) 2 g in dextrose 5 % 50 mL IVPB     2 g 100 mL/hr over 30 Minutes Intravenous  Once 12/29/16 1904 12/29/16 2007   12/29/16 1915  metroNIDAZOLE (FLAGYL) IVPB 500 mg     500 mg 100 mL/hr over 60 Minutes Intravenous  Once 12/29/16 1904 12/29/16 2049      Assessment/Plan: s/p Procedure(s): APPENDECTOMY LAPAROSCOPIC Perforated appendicitis Doing well without apparent  complication. Okay for discharge. Plan follow up with one week of Augmentin.   LOS: 2 days    Aldin Drees T 12/31/2016

## 2016-12-31 NOTE — Progress Notes (Signed)
Patient reported to RN new onset flushing/rash and itching of face, neck, back, and bilateral arms. MD Alphonsa Overall notified of change; one-time dose Benadryl given as ordered. MD Lucia Gaskins advised to administer scheduled IV Zosyn after Benadryl given. 1 hour post benadryl, patient's itching improved as well as flushing/rash. Frequent checks performed on patient to ensure safety and comfort. Patient reports continued improvement of itching and redness. Will continue to monitor for further changes.

## 2016-12-31 NOTE — Discharge Instructions (Signed)
[facility] ______[facility] LAPAROSCOPIC SURGERY: POST OP INSTRUCTIONS Always review your discharge instruction sheet given to you by the facility where your surgery was performed. IF YOU HAVE DISABILITY OR FAMILY LEAVE FORMS, YOU MUST BRING THEM TO THE OFFICE FOR PROCESSING.   DO NOT GIVE THEM TO YOUR DOCTOR.  1. A prescription for pain medication may be given to you upon discharge.  Take your pain medication as prescribed, if needed.  If narcotic pain medicine is not needed, then you may take acetaminophen (Tylenol) or ibuprofen (Advil) as needed. 2. Take your usually prescribed medications unless otherwise directed. 3. If you need a refill on your pain medication, please contact your pharmacy.  They will contact our office to request authorization. Prescriptions will not be filled after 5pm or on week-ends. 4. You should follow a light diet the first few days after arrival home, such as soup and crackers, etc.  Be sure to include lots of fluids daily. 5. Most patients will experience some swelling and bruising in the area of the incisions.  Ice packs will help.  Swelling and bruising can take several days to resolve.  6. It is common to experience some constipation if taking pain medication after surgery.  Increasing fluid intake and taking a stool softener (such as Colace) will usually help or prevent this problem from occurring.  A mild laxative (Milk of Magnesia or Miralax) should be taken according to package instructions if there are no bowel movements after 48 hours. 7. Unless discharge instructions indicate otherwise, you may remove your bandages 24-48 hours after surgery, and you may shower at that time.  You may have steri-strips (small skin tapes) in place directly over the incision.  These strips should be left on the skin for 7-10 days.  If your surgeon used skin glue on the incision, you may shower in 24 hours.  The glue will flake off over the next 2-3 weeks.  Any sutures or  staples will be removed at the office during your follow-up visit. 8. ACTIVITIES:  You may resume regular (light) daily activities beginning the next day--such as daily self-care, walking, climbing stairs--gradually increasing activities as tolerated.  You may have sexual intercourse when it is comfortable.  Refrain from any heavy lifting or straining until approved by your doctor. a. You may drive when you are no longer taking prescription pain medication, you can comfortably wear a seatbelt, and you can safely maneuver your car and apply brakes. b. RETURN TO WORK:  _____Can return to work in 2 weeks _____________________________________________________ 32. You should see your doctor in the office for a follow-up appointment approximately 2-3 weeks after your surgery.  Make sure that you call for this appointment within a day or two after you arrive home to insure a convenient appointment time. 10. OTHER INSTRUCTIONS: __________________________________________________________________________________________________________________________ __________________________________________________________________________________________________________________________ WHEN TO CALL YOUR DOCTOR: 1. Fever over 101.0 2. Inability to urinate 3. Continued bleeding from incision. 4. Increased pain, redness, or drainage from the incision. 5. Increasing abdominal pain  The clinic staff is available to answer your questions during regular business hours.  Please dont hesitate to call and ask to speak to one of the nurses for clinical concerns.  If you have a medical emergency, go to the nearest emergency room or call 911.  A surgeon from [facility] is always on call at the hospital. [address], [place], [place], [place]  [address] ? [address], [place],    [address] [phone] ?  [phone] ? FAX [phone] Web site: [email or web address]

## 2017-01-01 ENCOUNTER — Encounter (HOSPITAL_COMMUNITY): Payer: Self-pay | Admitting: General Surgery

## 2017-01-03 ENCOUNTER — Other Ambulatory Visit: Payer: Self-pay | Admitting: *Deleted

## 2017-01-03 NOTE — Patient Outreach (Signed)
[facility]) Care Management  01/03/2017  ALIN HUTCHINS 07-Jul-1951 [id number]   Subjective: Telephone call to patient's home number, spoke with husband, states Ms. Peeler is not available, left HIPAA compliant message with husband for patient, and requested call back.   Objective: Per KPN (point of care tool) and chart review, patient hospitalized 12/29/16 -12/31/16 for Acute appendicitis with localized peritonitis.   Status post APPENDECTOMY LAPAROSCOPIC pm 12/29/16.   Assessment:  Received [facility] Transition of care referral on 01/02/17 via [employer] report.   Transition of care follow up pending patient contact.     Plan: RNCM will call patient for 2nd telephone outreach attempt, transition of care follow up, within 10 business days if no return call.    Emmani Lesueur H. Annia Friendly, BSN, [place] Management [facility] Telephonic CM Phone: [phone] Fax: [phone]

## 2017-01-04 ENCOUNTER — Other Ambulatory Visit: Payer: Self-pay | Admitting: *Deleted

## 2017-01-04 NOTE — Patient Outreach (Signed)
[facility]) Care Management  01/04/2017  Brittany [place] 1951-05-18 [id number]  Subjective: Telephone call to patient's home number, no answer, left HIPAA compliant voicemail message, and requested call back.   Objective: Per KPN (point of care tool) and chart review, patient hospitalized 12/29/16 -12/31/16 for Acute appendicitis with localized peritonitis.   Status post APPENDECTOMY LAPAROSCOPIC pm 12/29/16.   Assessment:  Received [facility]MR Transition of care referral on 01/02/17 via [employer] report.   Transition of care follow up pending patient contact.     Plan: RNCM will call patient for 3rd telephone outreach attempt, transition of care follow up, within 10 business days if no return call.    Nyxon Strupp H. Annia Friendly, BSN, [place] Management [facility] Telephonic CM Phone: [phone] Fax: [phone]

## 2017-01-05 ENCOUNTER — Encounter: Payer: Self-pay | Admitting: *Deleted

## 2017-01-05 ENCOUNTER — Other Ambulatory Visit: Payer: Self-pay | Admitting: *Deleted

## 2017-01-05 NOTE — Patient Outreach (Addendum)
[facility]) Care Management  01/05/2017  Brittany [place] 1950/11/20 [id number]   Subjective: Telephone call to patient's home number, no answer, left HIPAA compliant voicemail message, and requested call back. Telephone call to patient's mobile number, no answer, left HIPAA compliant voicemail message, and requested call back.   Objective:Per KPN (point of care tool) and chart review, patient hospitalized 12/29/16 -12/31/16 for Acute appendicitis with localized peritonitis. Status post APPENDECTOMY LAPAROSCOPICpm 12/29/16.   Assessment: Received UMR Transition of care referral on 01/02/17 via [employer] report. Transition of care follow up pending patient contact.     Plan: RNCM will send unsuccessful outreach  letter, [facility] pamphlet, and proceed with case closure, within 10 business days if no return call.    Jamison Yuhasz H. Annia Friendly, BSN, [place] Management [facility] Telephonic CM Phone: [phone] Fax: [phone]

## 2017-01-15 MED FILL — clonazePAM 1 MG TABS: 1 | 30 days supply | Qty: 60 | Fill #3

## 2017-01-23 ENCOUNTER — Other Ambulatory Visit: Payer: Self-pay | Admitting: *Deleted

## 2017-01-23 NOTE — Patient Outreach (Signed)
[facility]) Care Management  01/23/2017  Brittany [place] 06/24/1951 [id number]  No response from patient outreach attempts will proceed with case closure.   Objective:Per KPN (point of care tool) and chart review, patient hospitalized 12/29/16 -12/31/16 for Acute appendicitis with localized peritonitis. Status post APPENDECTOMY LAPAROSCOPICpm 12/29/16.   Assessment: Received UMR Transition of care referral on 01/02/17 via [employer] report. Transition of care follow up not completed due to unable to contact patient and will proceed with case closure.     Plan: RNCM will send case closure due to unable to reach request to [name].   Brittany Axel H. Annia Friendly, BSN, [place] Management [facility] Telephonic CM Phone: [phone] Fax: [phone]

## 2017-02-07 MED FILL — HYDROCHLOROTHIAZIDE 12.5 MG: 12.5 | 90 days supply | Qty: 90 | Fill #2

## 2017-02-08 MED FILL — IBUPROFEN 600 MG TABLET: 600 | 90 days supply | Qty: 90 | Fill #0

## 2017-02-14 MED FILL — clonazePAM 1 MG TABS: 1 | 30 days supply | Qty: 60 | Fill #4

## 2017-02-16 MED FILL — LEVOTHYROXINE 112 MCG TAB: 112 | 90 days supply | Qty: 90 | Fill #1

## 2017-02-26 MED FILL — PANTOPRAZOLE SOD DR 40 MG T: 40 | 90 days supply | Qty: 90 | Fill #1

## 2017-03-01 DIAGNOSIS — Z0100 Encounter for examination of eyes and vision without abnormal findings: Secondary | ICD-10-CM | POA: Diagnosis not present

## 2017-03-05 ENCOUNTER — Other Ambulatory Visit: Payer: Self-pay | Admitting: Internal Medicine

## 2017-03-05 DIAGNOSIS — Z1231 Encounter for screening mammogram for malignant neoplasm of breast: Secondary | ICD-10-CM

## 2017-03-16 MED FILL — clonazePAM 1 MG TABS: 1 | 30 days supply | Qty: 60 | Fill #0

## 2017-04-12 ENCOUNTER — Ambulatory Visit
Admission: RE | Admit: 2017-04-12 | Discharge: 2017-04-12 | Disposition: A | Discharge status: Home / Self Care | Payer: 59 | Source: Ambulatory Visit | Attending: Internal Medicine | Admitting: Internal Medicine

## 2017-04-12 DIAGNOSIS — Z1231 Encounter for screening mammogram for malignant neoplasm of breast: Secondary | ICD-10-CM

## 2017-04-16 MED FILL — clonazePAM 1 MG TABS: 1 | 30 days supply | Qty: 60 | Fill #1

## 2017-05-03 MED FILL — HYDROCHLOROTHIAZIDE 12.5 MG: 12.5 | 90 days supply | Qty: 90 | Fill #0

## 2017-05-08 MED FILL — IBUPROFEN 600 MG TABLET: 600 | 90 days supply | Qty: 90 | Fill #1

## 2017-05-15 MED FILL — LEVOTHYROXINE 112 MCG TAB: 112 | 90 days supply | Qty: 90 | Fill #2

## 2017-05-15 MED FILL — clonazePAM 1 MG TABS: 1 | 30 days supply | Qty: 60 | Fill #2

## 2017-05-21 MED FILL — PANTOPRAZOLE SOD DR 40 MG T: 40 | 90 days supply | Qty: 90 | Fill #2

## 2017-05-24 DIAGNOSIS — Z01419 Encounter for gynecological examination (general) (routine) without abnormal findings: Secondary | ICD-10-CM | POA: Diagnosis not present

## 2017-05-24 DIAGNOSIS — L900 Lichen sclerosus et atrophicus: Secondary | ICD-10-CM | POA: Diagnosis not present

## 2017-05-24 DIAGNOSIS — Z1389 Encounter for screening for other disorder: Secondary | ICD-10-CM | POA: Diagnosis not present

## 2017-05-24 DIAGNOSIS — Z130 Encounter for screening for diseases of the blood and blood-forming organs and certain disorders involving the immune mechanism: Secondary | ICD-10-CM | POA: Diagnosis not present

## 2017-06-14 MED FILL — clonazePAM 1 MG TABS: 1 | 30 days supply | Qty: 60 | Fill #3

## 2017-07-16 MED FILL — clonazePAM 1 MG TABS: 1 | 30 days supply | Qty: 60 | Fill #4

## 2017-07-20 MED FILL — IBUPROFEN 600 MG TABLET: 600 | 90 days supply | Qty: 90 | Fill #0

## 2017-07-30 DIAGNOSIS — E038 Other specified hypothyroidism: Secondary | ICD-10-CM | POA: Diagnosis not present

## 2017-07-30 DIAGNOSIS — I10 Essential (primary) hypertension: Secondary | ICD-10-CM | POA: Diagnosis not present

## 2017-07-30 DIAGNOSIS — R82998 Other abnormal findings in urine: Secondary | ICD-10-CM | POA: Diagnosis not present

## 2017-07-30 DIAGNOSIS — M859 Disorder of bone density and structure, unspecified: Secondary | ICD-10-CM | POA: Diagnosis not present

## 2017-08-06 DIAGNOSIS — I340 Nonrheumatic mitral (valve) insufficiency: Secondary | ICD-10-CM | POA: Diagnosis not present

## 2017-08-06 DIAGNOSIS — I10 Essential (primary) hypertension: Secondary | ICD-10-CM | POA: Diagnosis not present

## 2017-08-06 DIAGNOSIS — Z0000 Encounter for general adult medical examination without abnormal findings: Secondary | ICD-10-CM | POA: Diagnosis not present

## 2017-08-06 DIAGNOSIS — M545 Low back pain: Secondary | ICD-10-CM | POA: Diagnosis not present

## 2017-08-06 DIAGNOSIS — E7849 Other hyperlipidemia: Secondary | ICD-10-CM | POA: Diagnosis not present

## 2017-08-06 DIAGNOSIS — K760 Fatty (change of) liver, not elsewhere classified: Secondary | ICD-10-CM | POA: Diagnosis not present

## 2017-08-06 DIAGNOSIS — Z800 Family history of malignant neoplasm of digestive organs: Secondary | ICD-10-CM | POA: Diagnosis not present

## 2017-08-06 DIAGNOSIS — M810 Age-related osteoporosis without current pathological fracture: Secondary | ICD-10-CM | POA: Diagnosis not present

## 2017-08-06 DIAGNOSIS — E038 Other specified hypothyroidism: Secondary | ICD-10-CM | POA: Diagnosis not present

## 2017-08-06 DIAGNOSIS — Z23 Encounter for immunization: Secondary | ICD-10-CM | POA: Diagnosis not present

## 2017-08-06 DIAGNOSIS — Z6827 Body mass index (BMI) 27.0-27.9, adult: Secondary | ICD-10-CM | POA: Diagnosis not present

## 2017-08-06 DIAGNOSIS — R740 Nonspecific elevation of levels of transaminase and lactic acid dehydrogenase [LDH]: Secondary | ICD-10-CM | POA: Diagnosis not present

## 2017-08-07 MED FILL — METHOCARBAMOL 500 MG TABS: 500 | 30 days supply | Qty: 60 | Fill #0

## 2017-08-07 MED FILL — HYDROCHLOROTHIAZIDE 12.5 MG: 12.5 | 90 days supply | Qty: 90 | Fill #0

## 2017-08-07 MED FILL — LEVOTHYROXINE 112 MCG TAB: 112 | 90 days supply | Qty: 90 | Fill #0

## 2017-08-13 MED FILL — clonazePAM 1 MG TABS: 1 | 30 days supply | Qty: 60 | Fill #0

## 2017-08-15 DIAGNOSIS — Z1212 Encounter for screening for malignant neoplasm of rectum: Secondary | ICD-10-CM | POA: Diagnosis not present

## 2017-08-16 MED FILL — CYCLOBENZAPRINE 10 MG TAB: 10 | 30 days supply | Qty: 90 | Fill #0

## 2017-09-12 MED FILL — clonazePAM 1 MG TABS: 1 | 30 days supply | Qty: 60 | Fill #1

## 2017-09-19 DIAGNOSIS — M1611 Unilateral primary osteoarthritis, right hip: Secondary | ICD-10-CM | POA: Diagnosis not present

## 2017-09-20 DIAGNOSIS — M810 Age-related osteoporosis without current pathological fracture: Secondary | ICD-10-CM | POA: Diagnosis not present

## 2017-09-20 DIAGNOSIS — Z6827 Body mass index (BMI) 27.0-27.9, adult: Secondary | ICD-10-CM | POA: Diagnosis not present

## 2017-09-26 DIAGNOSIS — M1611 Unilateral primary osteoarthritis, right hip: Secondary | ICD-10-CM | POA: Diagnosis not present

## 2017-09-28 MED FILL — IBUPROFEN 600 MG TABLET: 600 | 90 days supply | Qty: 90 | Fill #1

## 2017-10-01 ENCOUNTER — Other Ambulatory Visit (HOSPITAL_COMMUNITY): Payer: Self-pay

## 2017-10-02 ENCOUNTER — Encounter (HOSPITAL_COMMUNITY)
Admission: RE | Admit: 2017-10-02 | Discharge: 2017-10-02 | Disposition: A | Discharge status: Home / Self Care | Payer: Medicare Other | Source: Ambulatory Visit | Attending: Internal Medicine | Admitting: Internal Medicine

## 2017-10-02 DIAGNOSIS — M810 Age-related osteoporosis without current pathological fracture: Secondary | ICD-10-CM | POA: Diagnosis not present

## 2017-10-02 MED ORDER — DENOSUMAB 60 MG/ML ~~LOC~~ SOLN
60.0000 mg | Freq: Once | SUBCUTANEOUS | Status: AC
  Administered 2017-10-02: 60 mg via SUBCUTANEOUS
  Filled 2017-10-02: qty 1

## 2017-10-02 NOTE — Discharge Instructions (Signed)
Denosumab injection °What is this medicine? °DENOSUMAB (den oh sue mab) slows bone breakdown. Prolia is used to treat osteoporosis in women after menopause and in men. Xgeva is used to treat a high calcium level due to cancer and to prevent bone fractures and other bone problems caused by multiple myeloma or cancer bone metastases. Xgeva is also used to treat giant cell tumor of the bone. °This medicine may be used for other purposes; ask your health care provider or pharmacist if you have questions. °COMMON BRAND NAME(S): Prolia, XGEVA °What should I tell my health care provider before I take this medicine? °They need to know if you have any of these conditions: °-dental disease °-having surgery or tooth extraction °-infection °-kidney disease °-low levels of calcium or Vitamin D in the blood °-malnutrition °-on hemodialysis °-skin conditions or sensitivity °-thyroid or parathyroid disease °-an unusual reaction to denosumab, other medicines, foods, dyes, or preservatives °-pregnant or trying to get pregnant °-breast-feeding °How should I use this medicine? °This medicine is for injection under the skin. It is given by a health care professional in a hospital or clinic setting. °If you are getting Prolia, a special MedGuide will be given to you by the pharmacist with each prescription and refill. Be sure to read this information carefully each time. °For Prolia, talk to your pediatrician regarding the use of this medicine in children. Special care may be needed. For Xgeva, talk to your pediatrician regarding the use of this medicine in children. While this drug may be prescribed for children as young as 13 years for selected conditions, precautions do apply. °Overdosage: If you think you have taken too much of this medicine contact a poison control center or emergency room at once. °NOTE: This medicine is only for you. Do not share this medicine with others. °What if I miss a dose? °It is important not to miss your  dose. Call your doctor or health care professional if you are unable to keep an appointment. °What may interact with this medicine? °Do not take this medicine with any of the following medications: °-other medicines containing denosumab °This medicine may also interact with the following medications: °-medicines that lower your chance of fighting infection °-steroid medicines like prednisone or cortisone °This list may not describe all possible interactions. Give your health care provider a list of all the medicines, herbs, non-prescription drugs, or dietary supplements you use. Also tell them if you smoke, drink alcohol, or use illegal drugs. Some items may interact with your medicine. °What should I watch for while using this medicine? °Visit your doctor or health care professional for regular checks on your progress. Your doctor or health care professional may order blood tests and other tests to see how you are doing. °Call your doctor or health care professional for advice if you get a fever, chills or sore throat, or other symptoms of a cold or flu. Do not treat yourself. This drug may decrease your body's ability to fight infection. Try to avoid being around people who are sick. °You should make sure you get enough calcium and vitamin D while you are taking this medicine, unless your doctor tells you not to. Discuss the foods you eat and the vitamins you take with your health care professional. °See your dentist regularly. Brush and floss your teeth as directed. Before you have any dental work done, tell your dentist you are receiving this medicine. °Do not become pregnant while taking this medicine or for 5 months after stopping   it. Talk with your doctor or health care professional about your birth control options while taking this medicine. Women should inform their doctor if they wish to become pregnant or think they might be pregnant. There is a potential for serious side effects to an unborn child. Talk  to your health care professional or pharmacist for more information. What side effects may I notice from receiving this medicine? Side effects that you should report to your doctor or health care professional as soon as possible: -allergic reactions like skin rash, itching or hives, swelling of the face, lips, or tongue -bone pain -breathing problems -dizziness -jaw pain, especially after dental work -redness, blistering, peeling of the skin -signs and symptoms of infection like fever or chills; cough; sore throat; pain or trouble passing urine -signs of low calcium like fast heartbeat, muscle cramps or muscle pain; pain, tingling, numbness in the hands or feet; seizures -unusual bleeding or bruising -unusually weak or tired Side effects that usually do not require medical attention (report to your doctor or health care professional if they continue or are bothersome): -constipation -diarrhea -headache -joint pain -loss of appetite -muscle pain -runny nose -tiredness -upset stomach This list may not describe all possible side effects. Call your doctor for medical advice about side effects. You may report side effects to FDA at 1-800-FDA-1088. Where should I keep my medicine? This medicine is only given in a clinic, doctor's office, or other health care setting and will not be stored at home. NOTE: This sheet is a summary. It may not cover all possible information. If you have questions about this medicine, talk to your doctor, pharmacist, or health care provider.  2018 Elsevier/Gold Standard (2016-09-12 19:17:21)

## 2017-10-11 MED FILL — clonazePAM 1 MG TABS: 1 | 30 days supply | Qty: 60 | Fill #2

## 2017-11-02 MED FILL — ONDANSETRON ODT 4 MG TABLET: 4 | 7 days supply | Qty: 30 | Fill #0

## 2017-11-12 MED FILL — clonazePAM 1 MG TABS: 1 | 30 days supply | Qty: 60 | Fill #3

## 2017-11-12 MED FILL — HYDROCHLOROTHIAZIDE 12.5 MG: 12.5 | 90 days supply | Qty: 90 | Fill #1

## 2017-11-12 MED FILL — LEVOTHYROXINE 112 MCG TAB: 112 | 90 days supply | Qty: 90 | Fill #1

## 2017-11-19 MED FILL — PANTOPRAZOLE SOD DR 40 MG T: 40 | 30 days supply | Qty: 30 | Fill #0

## 2017-12-12 MED FILL — clonazePAM 1 MG TABS: 1 | 30 days supply | Qty: 60 | Fill #4

## 2017-12-18 DIAGNOSIS — M5134 Other intervertebral disc degeneration, thoracic region: Secondary | ICD-10-CM | POA: Diagnosis not present

## 2017-12-18 DIAGNOSIS — M50322 Other cervical disc degeneration at C5-C6 level: Secondary | ICD-10-CM | POA: Diagnosis not present

## 2017-12-18 DIAGNOSIS — M9902 Segmental and somatic dysfunction of thoracic region: Secondary | ICD-10-CM | POA: Diagnosis not present

## 2017-12-18 DIAGNOSIS — M9901 Segmental and somatic dysfunction of cervical region: Secondary | ICD-10-CM | POA: Diagnosis not present

## 2017-12-18 MED FILL — IBUPROFEN 600 MG TABLET: 600 | 90 days supply | Qty: 90 | Fill #0

## 2017-12-18 MED FILL — CYCLOBENZAPRINE 10 MG TAB: 10 | 30 days supply | Qty: 90 | Fill #1

## 2017-12-20 DIAGNOSIS — M9902 Segmental and somatic dysfunction of thoracic region: Secondary | ICD-10-CM | POA: Diagnosis not present

## 2017-12-20 DIAGNOSIS — M5134 Other intervertebral disc degeneration, thoracic region: Secondary | ICD-10-CM | POA: Diagnosis not present

## 2017-12-20 DIAGNOSIS — M50322 Other cervical disc degeneration at C5-C6 level: Secondary | ICD-10-CM | POA: Diagnosis not present

## 2017-12-20 DIAGNOSIS — M9901 Segmental and somatic dysfunction of cervical region: Secondary | ICD-10-CM | POA: Diagnosis not present

## 2017-12-20 MED FILL — VOLTAREN 1% GEL: 1 | 25 days supply | Qty: 200 | Fill #0

## 2017-12-24 MED FILL — CLOBETASOL PROPIONATE 0.05: 0.05 | 30 days supply | Qty: 60 | Fill #0

## 2018-01-11 MED FILL — clonazePAM 1 MG TABS: 1 | 30 days supply | Qty: 60 | Fill #0

## 2018-01-14 MED FILL — PANTOPRAZOLE SOD DR 40 MG T: 40 | 30 days supply | Qty: 30 | Fill #1

## 2018-02-11 MED FILL — clonazePAM 1 MG TABS: 1 | 30 days supply | Qty: 60 | Fill #1

## 2018-02-11 MED FILL — HYDROCHLOROTHIAZIDE 12.5 MG: 12.5 | 90 days supply | Qty: 90 | Fill #1

## 2018-02-11 MED FILL — LEVOTHYROXINE 112 MCG TAB: 112 | 90 days supply | Qty: 90 | Fill #2

## 2018-02-14 MED FILL — PANTOPRAZOLE SOD DR 40 MG T: 40 | 30 days supply | Qty: 30 | Fill #2

## 2018-02-19 ENCOUNTER — Encounter (HOSPITAL_COMMUNITY): Payer: Medicare Other

## 2018-02-21 DIAGNOSIS — M25551 Pain in right hip: Secondary | ICD-10-CM | POA: Diagnosis not present

## 2018-02-21 DIAGNOSIS — M1611 Unilateral primary osteoarthritis, right hip: Secondary | ICD-10-CM | POA: Diagnosis not present

## 2018-03-11 MED FILL — IBUPROFEN 600 MG TABLET: 600 | 90 days supply | Qty: 90 | Fill #1

## 2018-03-12 MED FILL — clonazePAM 1 MG TABS: 1 | 30 days supply | Qty: 60 | Fill #2

## 2018-03-13 ENCOUNTER — Encounter (HOSPITAL_COMMUNITY): Payer: Medicare Other

## 2018-03-14 ENCOUNTER — Other Ambulatory Visit: Payer: Self-pay | Admitting: Internal Medicine

## 2018-03-14 DIAGNOSIS — Z1231 Encounter for screening mammogram for malignant neoplasm of breast: Secondary | ICD-10-CM

## 2018-03-26 MED FILL — CYCLOBENZAPRINE 10 MG TAB: 10 | 30 days supply | Qty: 90 | Fill #0

## 2018-04-01 ENCOUNTER — Other Ambulatory Visit (HOSPITAL_COMMUNITY): Payer: Self-pay | Admitting: *Deleted

## 2018-04-02 ENCOUNTER — Ambulatory Visit (HOSPITAL_COMMUNITY)
Admission: RE | Admit: 2018-04-02 | Discharge: 2018-04-02 | Disposition: A | Discharge status: Home / Self Care | Payer: Medicare Other | Source: Ambulatory Visit | Attending: Internal Medicine | Admitting: Internal Medicine

## 2018-04-02 DIAGNOSIS — M810 Age-related osteoporosis without current pathological fracture: Secondary | ICD-10-CM | POA: Insufficient documentation

## 2018-04-02 MED ORDER — DENOSUMAB 60 MG/ML ~~LOC~~ SOSY
PREFILLED_SYRINGE | SUBCUTANEOUS | Status: AC
  Administered 2018-04-02: 60 mg
  Filled 2018-04-02: qty 1

## 2018-04-02 MED ORDER — DENOSUMAB 60 MG/ML ~~LOC~~ SOSY: 60.0000 mg | PREFILLED_SYRINGE | Freq: Once | SUBCUTANEOUS | Status: DC

## 2018-04-10 MED FILL — clonazePAM 1 MG TABS: 1 | 30 days supply | Qty: 60 | Fill #3

## 2018-04-12 DIAGNOSIS — H1013 Acute atopic conjunctivitis, bilateral: Secondary | ICD-10-CM | POA: Diagnosis not present

## 2018-04-15 MED FILL — PANTOPRAZOLE SOD DR 40 MG T: 40 | 30 days supply | Qty: 30 | Fill #3

## 2018-04-18 ENCOUNTER — Ambulatory Visit
Admission: RE | Admit: 2018-04-18 | Discharge: 2018-04-18 | Disposition: A | Discharge status: Home / Self Care | Payer: Medicare Other | Source: Ambulatory Visit | Attending: Internal Medicine | Admitting: Internal Medicine

## 2018-04-18 DIAGNOSIS — Z1231 Encounter for screening mammogram for malignant neoplasm of breast: Secondary | ICD-10-CM | POA: Diagnosis not present

## 2018-05-03 DIAGNOSIS — M1611 Unilateral primary osteoarthritis, right hip: Secondary | ICD-10-CM | POA: Diagnosis not present

## 2018-05-03 MED FILL — CELECOXIB 200 MG CAP: 200 | 30 days supply | Qty: 30 | Fill #0

## 2018-05-08 MED FILL — HYDROCHLOROTHIAZIDE 12.5 MG: 12.5 | 90 days supply | Qty: 90 | Fill #0

## 2018-05-08 MED FILL — clonazePAM 1 MG TABS: 1 | 30 days supply | Qty: 60 | Fill #4

## 2018-05-09 MED FILL — LEVOTHYROXINE 112 MCG TAB: 112 | 90 days supply | Qty: 90 | Fill #0

## 2018-05-15 MED FILL — CYCLOBENZAPRINE 10 MG TAB: 10 | 30 days supply | Qty: 90 | Fill #0

## 2018-05-23 ENCOUNTER — Other Ambulatory Visit: Payer: Self-pay | Admitting: Orthopaedic Surgery

## 2018-05-31 MED FILL — IBUPROFEN 600 MG TABLET: 600 | 90 days supply | Qty: 90 | Fill #2

## 2018-06-03 MED FILL — PANTOPRAZOLE SOD DR 40 MG T: 40 | 30 days supply | Qty: 30 | Fill #4

## 2018-06-05 NOTE — Pre-Procedure Instructions (Addendum)
Brittany Washington  06/05/2018      [place], [place] - [address] [place] [address] Phone: [phone] Fax: [phone]    Your procedure is scheduled on June 18, 2018.  Report to [facility]mitting at 800 AM.  Call this number if you have problems the morning of surgery:  [phone]   Remember:  Do not eat or drink after midnight.    Take these medicines the morning of surgery with A SIP OF WATER  Eye drops-if needed Pantoprazole (protonix)  7 days prior to surgery STOP taking any Aspirin (unless otherwise instructed by your surgeon), Aleve, Naproxen, Ibuprofen, Motrin, Advil, Goody's, BC's, all herbal medications, fish oil, and all vitamins    Do not wear jewelry, make-up or nail polish.  Do not wear lotions, powders, or perfumes, or deodorant.  Do not shave 48 hours prior to surgery.  Do not bring valuables to the hospital.  [facility] is not responsible for any belongings or valuables.  Contacts, dentures or bridgework may not be worn into surgery.  Leave your suitcase in the car.  After surgery it may be brought to your room.  For patients admitted to the hospital, discharge time will be determined by your treatment team.  Patients discharged the day of surgery will not be allowed to drive home.    [place]- Preparing For Surgery  Before surgery, you can play an important role. Because skin is not sterile, your skin needs to be as free of germs as possible. You can reduce the number of germs on your skin by washing with CHG (chlorahexidine gluconate) Soap before surgery.  CHG is an antiseptic cleaner which kills germs and bonds with the skin to continue killing germs even after washing.    Oral Hygiene is also important to reduce your risk of infection.  Remember - BRUSH YOUR TEETH THE MORNING OF SURGERY WITH YOUR REGULAR TOOTHPASTE  Please do not use if you have an  allergy to CHG or antibacterial soaps. If your skin becomes reddened/irritated stop using the CHG.  Do not shave (including legs and underarms) for at least 48 hours prior to first CHG shower. It is OK to shave your face.  Please follow these instructions carefully.   1. Shower the NIGHT BEFORE SURGERY and the MORNING OF SURGERY with CHG.   2. If you chose to wash your hair, wash your hair first as usual with your normal shampoo.  3. After you shampoo, rinse your hair and body thoroughly to remove the shampoo.  4. Use CHG as you would any other liquid soap. You can apply CHG directly to the skin and wash gently with a scrungie or a clean washcloth.   5. Apply the CHG Soap to your body ONLY FROM THE NECK DOWN.  Do not use on open wounds or open sores. Avoid contact with your eyes, ears, mouth and genitals (private parts). Wash Face and genitals (private parts)  with your normal soap.  6. Wash thoroughly, paying special attention to the area where your surgery will be performed.  7. Thoroughly rinse your body with warm water from the neck down.  8. DO NOT shower/wash with your normal soap after using and rinsing off the CHG Soap.  9. Pat yourself dry with a CLEAN TOWEL.  10. Wear CLEAN PAJAMAS to bed the night before surgery, wear comfortable clothes the morning of surgery  11. Place  CLEAN SHEETS on your bed the night of your first shower and DO NOT SLEEP WITH PETS. 12.  Day of Surgery:  Do not apply any deodorants/lotions.  Please wear clean clothes to the hospital/surgery center.   Remember to brush your teeth WITH YOUR REGULAR TOOTHPASTE.   Please read over the following fact sheets that you were given. Pain Booklet, Coughing and Deep Breathing, MRSA Information and Surgical Site Infection Prevention

## 2018-06-06 ENCOUNTER — Encounter (HOSPITAL_COMMUNITY)
Admission: RE | Admit: 2018-06-06 | Discharge: 2018-06-06 | Disposition: A | Discharge status: Home / Self Care | Payer: Medicare Other | Source: Ambulatory Visit | Attending: Orthopaedic Surgery | Admitting: Orthopaedic Surgery

## 2018-06-06 ENCOUNTER — Encounter (HOSPITAL_COMMUNITY): Payer: Self-pay

## 2018-06-06 ENCOUNTER — Other Ambulatory Visit: Payer: Self-pay

## 2018-06-06 DIAGNOSIS — Z01818 Encounter for other preprocedural examination: Secondary | ICD-10-CM | POA: Diagnosis not present

## 2018-06-06 LAB — CBC WITH DIFFERENTIAL/PLATELET
Abs Immature Granulocytes: 0 10*3/uL (ref 0.0–0.1)
Basophils Absolute: 0.1 10*3/uL (ref 0.0–0.1)
Basophils Relative: 1 %
Eosinophils Absolute: 0.1 10*3/uL (ref 0.0–0.7)
Eosinophils Relative: 1 %
HCT: 43.6 % (ref 36.0–46.0)
Hemoglobin: 14 g/dL (ref 12.0–15.0)
IMMATURE GRANULOCYTES: 0 %
LYMPHS PCT: 41 %
Lymphs Abs: 2.7 10*3/uL (ref 0.7–4.0)
MCH: 32.6 pg (ref 26.0–34.0)
MCHC: 32.1 g/dL (ref 30.0–36.0)
MCV: 101.6 fL — AB (ref 78.0–100.0)
MONOS PCT: 10 %
Monocytes Absolute: 0.7 10*3/uL (ref 0.1–1.0)
NEUTROS PCT: 47 %
Neutro Abs: 3.1 10*3/uL (ref 1.7–7.7)
Platelets: 214 10*3/uL (ref 150–400)
RBC: 4.29 MIL/uL (ref 3.87–5.11)
RDW: 12.1 % (ref 11.5–15.5)
WBC: 6.6 10*3/uL (ref 4.0–10.5)

## 2018-06-06 LAB — PROTIME-INR
INR: 0.95
Prothrombin Time: 12.6 seconds (ref 11.4–15.2)

## 2018-06-06 LAB — URINALYSIS, ROUTINE W REFLEX MICROSCOPIC
Bilirubin Urine: NEGATIVE
Glucose, UA: NEGATIVE mg/dL
Hgb urine dipstick: NEGATIVE
Ketones, ur: NEGATIVE mg/dL
Nitrite: NEGATIVE
Protein, ur: NEGATIVE mg/dL
SPECIFIC GRAVITY, URINE: 1.02 (ref 1.005–1.030)
pH: 5 (ref 5.0–8.0)

## 2018-06-06 LAB — URINALYSIS, MICROSCOPIC (REFLEX): WBC, UA: >50 WBC/hpf (ref 0–5)

## 2018-06-06 LAB — BASIC METABOLIC PANEL
ANION GAP: 10 (ref 5–15)
BUN: 22 mg/dL (ref 8–23)
CALCIUM: 9.5 mg/dL (ref 8.9–10.3)
CHLORIDE: 104 mmol/L (ref 98–111)
CO2: 24 mmol/L (ref 22–32)
CREATININE: 1.01 mg/dL — AB (ref 0.44–1.00)
GFR calc non Af Amer: 57 mL/min — ABNORMAL LOW (ref >60)
GLUCOSE: 95 mg/dL (ref 70–99)
Potassium: 4.1 mmol/L (ref 3.5–5.1)
Sodium: 138 mmol/L (ref 135–145)

## 2018-06-06 LAB — SURGICAL PCR SCREEN
MRSA, PCR: NEGATIVE
STAPHYLOCOCCUS AUREUS: NEGATIVE

## 2018-06-06 LAB — APTT: aPTT: 31 seconds (ref 24–36)

## 2018-06-06 NOTE — Progress Notes (Addendum)
PCP: Marton Redwood, MD  Cardiologist: pt dneis  EKG: 06/06/18 in [facility]  Stress test: denies  ECHO: 06/25/2015  Cardiac Cath: denies  Chest x-ray: 06/06/18 in [facility]

## 2018-06-06 NOTE — Progress Notes (Signed)
Called and left a confidential voice message for Horatio Pel at Dr. Jerald Kief office informing her of the patient's abnormal urinalysis.  Left call back number for questions.

## 2018-06-11 MED FILL — clonazePAM 1 MG TABS: 1 | 30 days supply | Qty: 60 | Fill #0

## 2018-06-12 ENCOUNTER — Other Ambulatory Visit: Payer: Self-pay | Admitting: Orthopaedic Surgery

## 2018-06-12 NOTE — Care Plan (Signed)
Spoke with patient. States that she will discharge to home with family and HHPT. She has all needed equipment at home. OPPT has been set up.   Please contact me with questions   Ladell Heads, [place]

## 2018-06-12 NOTE — H&P (Signed)
TOTAL HIP ADMISSION H&P  Patient is admitted for right total hip arthroplasty.  Subjective:  Chief Complaint: right hip pain  HPI: Brittany Washington, 67 y.o. female, has a history of pain and functional disability in the right hip(s) due to arthritis and patient has failed non-surgical conservative treatments for greater than 12 weeks to include NSAID's and/or analgesics, corticosteriod injections, flexibility and strengthening excercises, use of assistive devices, weight reduction as appropriate and activity modification.  Onset of symptoms was gradual starting 5 years ago with gradually worsening course since that time.The patient noted no past surgery on the right hip(s).  Patient currently rates pain in the right hip at 10 out of 10 with activity. Patient has night pain, worsening of pain with activity and weight bearing, trendelenberg gait, pain that interfers with activities of daily living and crepitus. Patient has evidence of subchondral cysts, subchondral sclerosis, periarticular osteophytes and joint space narrowing by imaging studies. This condition presents safety issues increasing the risk of falls.  There is no current active infection.  Patient Active Problem List   Diagnosis Date Noted  . Acute appendicitis with perforation and peritoneal abscess 12/29/2016  . Mitral regurgitation 01/24/2016  . Stroke-like symptoms 06/25/2015  . Headache 06/25/2015  . Hypertension 06/25/2015  . Expressive aphasia   . Cough 06/25/2014  . Multiple respiratory allergies 03/11/2014  . Other abnormal blood chemistry 06/13/2011  . Benign hypertensive heart disease without heart failure 06/13/2011  . Hypothyroid 06/13/2011  . Heart murmur 06/13/2011   Past Medical History:  Diagnosis Date  . Benign hypertensive heart disease without heart failure   . Heart murmur, systolic    MECHNICAL  . Hypertension    mild, lost weight, no meds now  . Hypothyroidism   . Osteoporosis     Past Surgical  History:  Procedure Laterality Date  . APPENDECTOMY    . BACK SURGERY    . COLONOSCOPY    . FRACTURE SURGERY    . HAMMER TOE SURGERY Left 07/14/2013   Procedure: HAMMER TOE CORRECTION LEFT SECOND TOE, [place]/AKIN LEFT;  Surgeon: [name], DPM;  Location: [place];  Service: Podiatry;  Laterality: Left;  . LAPAROSCOPIC APPENDECTOMY N/A 12/29/2016   Procedure: APPENDECTOMY LAPAROSCOPIC;  Surgeon: Excell Seltzer, MD;  Location: [facility];  Service: General;  Laterality: N/A;  . [place]  . LUMBAR DISKECTOMY W/ DISSECTION, LEFT L4 -- L5  02-23-2004  . METATARSAL OSTEOTOMY Left 04/02/2015   Procedure: LEFT FOOT METATARSAL OSTEOTOMY;  Surgeon: [name], MD;  Location: [place];  Service: Orthopedics;  Laterality: Left;  . METATARSAL OSTEOTOMY Left 08/18/2016   Procedure: METATARSAL OSTEOTOMY SECOND TOE;  Surgeon: [name], MD;  Location: [place];  Service: Orthopedics;  Laterality: Left;  . ORIF LEFT DISTAL RADIAL FX W/ BONE GRAFT  01-14-2009  . POSTERIOR LUMBAR FUSION  05-16-2005   L4 -- L5  . RE-DO LUMBAR DISKECTOMY , L4  - L5  05-03-2004  . TRIGGER FINGER RELEASE  01/09/2012   Procedure: RELEASE TRIGGER FINGER/A-1 PULLEY;  Surgeon: [name], MD;  Location: [place];  Service: Orthopedics;  Laterality: Left;  Marland Kitchen VAGINAL HYSTERECTOMY  1993   W/ BILATERAL SALPINGOOPHORECTOMY  . WRIST FRACTURE SURGERY     left    No current facility-administered medications for this encounter.    Current Outpatient Medications  Medication Sig Dispense Refill Last Dose  . cetirizine (ZYRTEC) 10 MG tablet Take 10 mg  by mouth at bedtime.    12/28/2016 at Unknown time  . Cholecalciferol (VITAMIN D3) 400 units CAPS Take 400 Units by mouth daily.     . clonazePAM (KLONOPIN) 1 MG tablet Take 1 tablet (1 mg total) by mouth at bedtime. Take 1-2 tablets by mouth daily at bedtime as needed for  sleep. (Patient taking differently: Take 1-2 mg by mouth at bedtime as needed (for sleep). ) 15 tablet 0 12/28/2016 at Unknown time  . cyclobenzaprine (FLEXERIL) 10 MG tablet Take 10 mg by mouth at bedtime as needed for muscle spasms.     . hydrochlorothiazide (MICROZIDE) 12.5 MG capsule TAKE 1 CAPSULE BY MOUTH DAILY. (Patient taking differently: Take 12.5 mg by mouth daily. ) 30 capsule 9 12/29/2016 at Unknown time  . ibuprofen (ADVIL,MOTRIN) 600 MG tablet Take 600 mg by mouth every 6 (six) hours as needed for moderate pain.     Marland Kitchen ketotifen (ZADITOR) 0.025 % ophthalmic solution Place 1 drop into both eyes daily.     Marland Kitchen levothyroxine (SYNTHROID, LEVOTHROID) 112 MCG tablet TAKE 1 TABLET BY MOUTH ONCE DAILY (Patient taking differently: Take 112 mcg by mouth at bedtime. ) 90 tablet PRN 12/28/2016 at Unknown time  . pantoprazole (PROTONIX) 40 MG tablet Take 40 mg by mouth daily as needed (for acid reflux).    12/28/2016 at Unknown time  . amoxicillin-clavulanate (AUGMENTIN) 875-125 MG tablet Take 1 tablet by mouth 2 (two) times daily. (Patient not taking: Reported on 05/30/2018) 14 tablet 3 Not Taking at Unknown time  . HYDROcodone-acetaminophen (NORCO/VICODIN) 5-325 MG tablet Take 1 tablet by mouth every 4 (four) hours as needed (pain). (Patient not taking: Reported on 05/30/2018) 20 tablet 0 Not Taking at Unknown time   Allergies  Allergen Reactions  . Sulfa Antibiotics Other (See Comments)    Body aches     Social History   Tobacco Use  . Smoking status: Never Smoker  . Smokeless tobacco: Never Used  Substance Use Topics  . Alcohol use: Yes    Alcohol/week: 7.0 standard drinks    Types: 7 Glasses of wine per week    Comment: 1 WINE DAILY    Family History  Problem Relation Age of Onset  . Heart attack Father   . Colon cancer Father   . Heart attack Brother   . Ovarian cancer Mother   . Rectal cancer Neg Hx   . Stomach cancer Neg Hx   . Breast cancer Neg Hx      Review of Systems   Musculoskeletal: Positive for joint pain.       Right hip   All other systems reviewed and are negative.   Objective:  Physical Exam  Constitutional: She is oriented to person, place, and time. She appears well-developed and well-nourished.  HENT:  Head: Normocephalic and atraumatic.  Eyes: Pupils are equal, round, and reactive to light.  Neck: Normal range of motion.  Cardiovascular: Normal rate and regular rhythm.  Respiratory: Effort normal.  GI: Soft.  Musculoskeletal:  Right hip motion is limited and very painful in internal rotation.  She walks with an altered gait.  Leg lengths look equal.  Sensation and motor function are intact in her feet with palpable pulses on both sides.    Neurological: She is alert and oriented to person, place, and time.  Skin: Skin is warm and dry.  Psychiatric: She has a normal mood and affect. Her behavior is normal. Judgment and thought content normal.    Vital signs in  last 24 hours:    Labs:   Estimated body mass index is 26.78 kg/m as calculated from the following:   Height as of 06/06/18: 5\' 4"  (1.626 m).   Weight as of 06/06/18: 70.8 kg.   Imaging Review Plain radiographs demonstrate severe degenerative joint disease of the right hip(s). The bone quality appears to be good for age and reported activity level.    Preoperative templating of the joint replacement has been completed, documented, and submitted to the Operating Room personnel in order to optimize intra-operative equipment management.     Assessment/Plan:  End stage primary arthritis, right hip(s)  The patient history, physical examination, clinical judgement of the provider and imaging studies are consistent with end stage degenerative joint disease of the right hip(s) and total hip arthroplasty is deemed medically necessary. The treatment options including medical management, injection therapy, arthroscopy and arthroplasty were discussed at length. The risks and  benefits of total hip arthroplasty were presented and reviewed. The risks due to aseptic loosening, infection, stiffness, dislocation/subluxation,  thromboembolic complications and other imponderables were discussed.  The patient acknowledged the explanation, agreed to proceed with the plan and consent was signed. Patient is being admitted for inpatient treatment for surgery, pain control, PT, OT, prophylactic antibiotics, VTE prophylaxis, progressive ambulation and ADL's and discharge planning.The patient is planning to be discharged home with home health services

## 2018-06-17 MED ORDER — TRANEXAMIC ACID 1000 MG/10ML IV SOLN
2000.0000 mg | INTRAVENOUS | Status: AC
  Administered 2018-06-18: 2000 mg via TOPICAL
  Filled 2018-06-17: qty 20

## 2018-06-17 MED ORDER — TRANEXAMIC ACID-NACL 1000-0.7 MG/100ML-% IV SOLN
1000.0000 mg | INTRAVENOUS | Status: DC
  Filled 2018-06-17: qty 100

## 2018-06-17 MED ORDER — BUPIVACAINE LIPOSOME 1.3 % IJ SUSP
10.0000 mL | INTRAMUSCULAR | Status: AC
  Administered 2018-06-18: 20 mL
  Filled 2018-06-17: qty 10

## 2018-06-18 ENCOUNTER — Inpatient Hospital Stay (HOSPITAL_COMMUNITY)
Admission: RE | Admit: 2018-06-18 | Discharge: 2018-06-19 | DRG: 470 | Disposition: A | Discharge status: Home Health | Payer: Medicare Other | Attending: Orthopaedic Surgery | Admitting: Orthopaedic Surgery

## 2018-06-18 ENCOUNTER — Inpatient Hospital Stay (HOSPITAL_COMMUNITY): Payer: Medicare Other

## 2018-06-18 ENCOUNTER — Inpatient Hospital Stay (HOSPITAL_COMMUNITY): Payer: Medicare Other | Admitting: Anesthesiology

## 2018-06-18 ENCOUNTER — Encounter (HOSPITAL_COMMUNITY)
Admission: RE | Disposition: A | Discharge status: Home Health | Payer: Self-pay | Source: Home / Self Care | Attending: Orthopaedic Surgery

## 2018-06-18 ENCOUNTER — Encounter (HOSPITAL_COMMUNITY): Payer: Self-pay | Admitting: *Deleted

## 2018-06-18 ENCOUNTER — Other Ambulatory Visit: Payer: Self-pay

## 2018-06-18 DIAGNOSIS — I10 Essential (primary) hypertension: Secondary | ICD-10-CM | POA: Diagnosis not present

## 2018-06-18 DIAGNOSIS — Z882 Allergy status to sulfonamides status: Secondary | ICD-10-CM | POA: Diagnosis not present

## 2018-06-18 DIAGNOSIS — Z981 Arthrodesis status: Secondary | ICD-10-CM

## 2018-06-18 DIAGNOSIS — Z9079 Acquired absence of other genital organ(s): Secondary | ICD-10-CM | POA: Diagnosis not present

## 2018-06-18 DIAGNOSIS — Z419 Encounter for procedure for purposes other than remedying health state, unspecified: Secondary | ICD-10-CM

## 2018-06-18 DIAGNOSIS — Z90722 Acquired absence of ovaries, bilateral: Secondary | ICD-10-CM

## 2018-06-18 DIAGNOSIS — M810 Age-related osteoporosis without current pathological fracture: Secondary | ICD-10-CM | POA: Diagnosis present

## 2018-06-18 DIAGNOSIS — R011 Cardiac murmur, unspecified: Secondary | ICD-10-CM | POA: Diagnosis present

## 2018-06-18 DIAGNOSIS — Z79890 Hormone replacement therapy (postmenopausal): Secondary | ICD-10-CM

## 2018-06-18 DIAGNOSIS — Z79899 Other long term (current) drug therapy: Secondary | ICD-10-CM | POA: Diagnosis not present

## 2018-06-18 DIAGNOSIS — Z90710 Acquired absence of both cervix and uterus: Secondary | ICD-10-CM

## 2018-06-18 DIAGNOSIS — I340 Nonrheumatic mitral (valve) insufficiency: Secondary | ICD-10-CM | POA: Diagnosis present

## 2018-06-18 DIAGNOSIS — I119 Hypertensive heart disease without heart failure: Secondary | ICD-10-CM | POA: Diagnosis not present

## 2018-06-18 DIAGNOSIS — M1611 Unilateral primary osteoarthritis, right hip: Secondary | ICD-10-CM | POA: Diagnosis not present

## 2018-06-18 DIAGNOSIS — Z8249 Family history of ischemic heart disease and other diseases of the circulatory system: Secondary | ICD-10-CM | POA: Diagnosis not present

## 2018-06-18 DIAGNOSIS — E039 Hypothyroidism, unspecified: Secondary | ICD-10-CM | POA: Diagnosis not present

## 2018-06-18 DIAGNOSIS — Z471 Aftercare following joint replacement surgery: Secondary | ICD-10-CM | POA: Diagnosis not present

## 2018-06-18 DIAGNOSIS — Z96641 Presence of right artificial hip joint: Secondary | ICD-10-CM | POA: Diagnosis not present

## 2018-06-18 HISTORY — DX: Unilateral primary osteoarthritis, right hip: M16.11

## 2018-06-18 HISTORY — PX: TOTAL HIP ARTHROPLASTY: SHX124

## 2018-06-18 LAB — URINALYSIS, ROUTINE W REFLEX MICROSCOPIC
Bilirubin Urine: NEGATIVE
Glucose, UA: NEGATIVE mg/dL
Ketones, ur: NEGATIVE mg/dL
Nitrite: NEGATIVE
PH: 5 (ref 5.0–8.0)
Protein, ur: NEGATIVE mg/dL

## 2018-06-18 LAB — URINALYSIS, MICROSCOPIC (REFLEX)

## 2018-06-18 SURGERY — ARTHROPLASTY, HIP, TOTAL, ANTERIOR APPROACH
Anesthesia: Spinal | Site: Hip | Laterality: Right

## 2018-06-18 MED ORDER — 0.9 % SODIUM CHLORIDE (POUR BTL) OPTIME
TOPICAL | Status: DC | PRN
  Administered 2018-06-18: 1000 mL

## 2018-06-18 MED ORDER — ONDANSETRON HCL 4 MG PO TABS: 4.0000 mg | ORAL_TABLET | Freq: Four times a day (QID) | ORAL | Status: DC | PRN

## 2018-06-18 MED ORDER — ACETAMINOPHEN 325 MG PO TABS: 325.0000 mg | ORAL_TABLET | Freq: Four times a day (QID) | ORAL | Status: DC | PRN

## 2018-06-18 MED ORDER — METHOCARBAMOL 1000 MG/10ML IJ SOLN
500.0000 mg | Freq: Four times a day (QID) | INTRAVENOUS | Status: DC | PRN
  Filled 2018-06-18: qty 5

## 2018-06-18 MED ORDER — HYDROCODONE-ACETAMINOPHEN 5-325 MG PO TABS
1.0000 | ORAL_TABLET | ORAL | Status: DC | PRN
  Administered 2018-06-18 – 2018-06-19 (×4): 2 via ORAL
  Filled 2018-06-18 (×3): qty 2
  Filled 2018-06-18: qty 1
  Filled 2018-06-18: qty 2

## 2018-06-18 MED ORDER — PROPOFOL 10 MG/ML IV BOLUS
INTRAVENOUS | Status: AC
  Filled 2018-06-18: qty 20

## 2018-06-18 MED ORDER — PANTOPRAZOLE SODIUM 40 MG PO TBEC: 40.0000 mg | DELAYED_RELEASE_TABLET | Freq: Every day | ORAL | Status: DC | PRN

## 2018-06-18 MED ORDER — ASPIRIN EC 325 MG PO TBEC
325.0000 mg | DELAYED_RELEASE_TABLET | Freq: Two times a day (BID) | ORAL | Status: DC
  Administered 2018-06-19: 325 mg via ORAL
  Filled 2018-06-18: qty 1

## 2018-06-18 MED ORDER — BUPIVACAINE IN DEXTROSE 0.75-8.25 % IT SOLN
INTRATHECAL | Status: DC | PRN
  Administered 2018-06-18: 1.8 mL via INTRATHECAL

## 2018-06-18 MED ORDER — CHLORHEXIDINE GLUCONATE 4 % EX LIQD: 60.0000 mL | Freq: Once | CUTANEOUS | Status: DC

## 2018-06-18 MED ORDER — CEFAZOLIN SODIUM-DEXTROSE 2-4 GM/100ML-% IV SOLN
2.0000 g | Freq: Four times a day (QID) | INTRAVENOUS | Status: AC
  Administered 2018-06-18 – 2018-06-19 (×2): 2 g via INTRAVENOUS
  Filled 2018-06-18 (×2): qty 100

## 2018-06-18 MED ORDER — CYCLOBENZAPRINE HCL 10 MG PO TABS: 10.0000 mg | ORAL_TABLET | Freq: Every evening | ORAL | Status: DC | PRN

## 2018-06-18 MED ORDER — ONDANSETRON HCL 4 MG/2ML IJ SOLN
INTRAMUSCULAR | Status: AC
  Filled 2018-06-18: qty 2

## 2018-06-18 MED ORDER — MENTHOL 3 MG MT LOZG: 1.0000 | LOZENGE | OROMUCOSAL | Status: DC | PRN

## 2018-06-18 MED ORDER — DIPHENHYDRAMINE HCL 12.5 MG/5ML PO ELIX: 12.5000 mg | ORAL_SOLUTION | ORAL | Status: DC | PRN

## 2018-06-18 MED ORDER — LACTATED RINGERS IV SOLN
INTRAVENOUS | Status: DC
  Administered 2018-06-18 (×2): via INTRAVENOUS

## 2018-06-18 MED ORDER — BUPIVACAINE-EPINEPHRINE (PF) 0.25% -1:200000 IJ SOLN
INTRAMUSCULAR | Status: AC
  Filled 2018-06-18: qty 30

## 2018-06-18 MED ORDER — DEXAMETHASONE SODIUM PHOSPHATE 10 MG/ML IJ SOLN
INTRAMUSCULAR | Status: DC | PRN
  Administered 2018-06-18: 10 mg via INTRAVENOUS

## 2018-06-18 MED ORDER — CEFAZOLIN SODIUM-DEXTROSE 2-4 GM/100ML-% IV SOLN
2.0000 g | INTRAVENOUS | Status: DC
  Filled 2018-06-18: qty 100

## 2018-06-18 MED ORDER — PROPOFOL 500 MG/50ML IV EMUL
INTRAVENOUS | Status: DC | PRN
  Administered 2018-06-18: 100 ug/kg/min via INTRAVENOUS

## 2018-06-18 MED ORDER — HYDROCODONE-ACETAMINOPHEN 7.5-325 MG PO TABS: 1.0000 | ORAL_TABLET | ORAL | Status: DC | PRN

## 2018-06-18 MED ORDER — FENTANYL CITRATE (PF) 100 MCG/2ML IJ SOLN
INTRAMUSCULAR | Status: AC
  Filled 2018-06-18: qty 2

## 2018-06-18 MED ORDER — PROMETHAZINE HCL 25 MG/ML IJ SOLN: 6.2500 mg | INTRAMUSCULAR | Status: DC | PRN

## 2018-06-18 MED ORDER — PHENOL 1.4 % MT LIQD: 1.0000 | OROMUCOSAL | Status: DC | PRN

## 2018-06-18 MED ORDER — BUPIVACAINE-EPINEPHRINE (PF) 0.25% -1:200000 IJ SOLN
INTRAMUSCULAR | Status: DC | PRN
  Administered 2018-06-18: 20 mL via PERINEURAL

## 2018-06-18 MED ORDER — KETOROLAC TROMETHAMINE 15 MG/ML IJ SOLN
7.5000 mg | Freq: Four times a day (QID) | INTRAMUSCULAR | Status: AC
  Administered 2018-06-18 – 2018-06-19 (×4): 7.5 mg via INTRAVENOUS
  Filled 2018-06-18 (×4): qty 1

## 2018-06-18 MED ORDER — METOCLOPRAMIDE HCL 5 MG PO TABS: 5.0000 mg | ORAL_TABLET | Freq: Three times a day (TID) | ORAL | Status: DC | PRN

## 2018-06-18 MED ORDER — SODIUM CHLORIDE 0.9 % IV SOLN
INTRAVENOUS | Status: DC | PRN
  Administered 2018-06-18: 15 ug/min via INTRAVENOUS

## 2018-06-18 MED ORDER — LEVOTHYROXINE SODIUM 112 MCG PO TABS
112.0000 ug | ORAL_TABLET | Freq: Every day | ORAL | Status: DC
  Administered 2018-06-19: 112 ug via ORAL
  Filled 2018-06-18 (×2): qty 1

## 2018-06-18 MED ORDER — FENTANYL CITRATE (PF) 100 MCG/2ML IJ SOLN
25.0000 ug | INTRAMUSCULAR | Status: DC | PRN
  Administered 2018-06-18 (×5): 25 ug via INTRAVENOUS

## 2018-06-18 MED ORDER — ONDANSETRON HCL 4 MG/2ML IJ SOLN
INTRAMUSCULAR | Status: DC | PRN
  Administered 2018-06-18: 4 mg via INTRAVENOUS

## 2018-06-18 MED ORDER — MIDAZOLAM HCL 2 MG/2ML IJ SOLN
INTRAMUSCULAR | Status: AC
  Filled 2018-06-18: qty 2

## 2018-06-18 MED ORDER — FENTANYL CITRATE (PF) 250 MCG/5ML IJ SOLN
INTRAMUSCULAR | Status: AC
  Filled 2018-06-18: qty 5

## 2018-06-18 MED ORDER — ONDANSETRON HCL 4 MG/2ML IJ SOLN: 4.0000 mg | Freq: Four times a day (QID) | INTRAMUSCULAR | Status: DC | PRN

## 2018-06-18 MED ORDER — FENTANYL CITRATE (PF) 100 MCG/2ML IJ SOLN
INTRAMUSCULAR | Status: DC | PRN
  Administered 2018-06-18: 100 ug via INTRAVENOUS

## 2018-06-18 MED ORDER — TRANEXAMIC ACID-NACL 1000-0.7 MG/100ML-% IV SOLN: 1000.0000 mg | Freq: Once | INTRAVENOUS | Status: DC

## 2018-06-18 MED ORDER — DOCUSATE SODIUM 100 MG PO CAPS
100.0000 mg | ORAL_CAPSULE | Freq: Two times a day (BID) | ORAL | Status: DC
  Administered 2018-06-18 – 2018-06-19 (×2): 100 mg via ORAL
  Filled 2018-06-18 (×3): qty 1

## 2018-06-18 MED ORDER — METOCLOPRAMIDE HCL 5 MG/ML IJ SOLN: 5.0000 mg | Freq: Three times a day (TID) | INTRAMUSCULAR | Status: DC | PRN

## 2018-06-18 MED ORDER — MORPHINE SULFATE (PF) 2 MG/ML IV SOLN
0.5000 mg | INTRAVENOUS | Status: DC | PRN
  Administered 2018-06-18: 1 mg via INTRAVENOUS
  Filled 2018-06-18: qty 1

## 2018-06-18 MED ORDER — LACTATED RINGERS IV SOLN
INTRAVENOUS | Status: DC
  Administered 2018-06-18: 17:00:00 via INTRAVENOUS

## 2018-06-18 MED ORDER — KETOTIFEN FUMARATE 0.025 % OP SOLN: 1.0000 [drp] | Freq: Every day | OPHTHALMIC | Status: DC

## 2018-06-18 MED ORDER — MIDAZOLAM HCL 5 MG/5ML IJ SOLN
INTRAMUSCULAR | Status: DC | PRN
  Administered 2018-06-18: 2 mg via INTRAVENOUS

## 2018-06-18 MED ORDER — METHOCARBAMOL 500 MG PO TABS
500.0000 mg | ORAL_TABLET | Freq: Four times a day (QID) | ORAL | Status: DC | PRN
  Administered 2018-06-19: 500 mg via ORAL
  Filled 2018-06-18 (×2): qty 1

## 2018-06-18 MED ORDER — ACETAMINOPHEN 500 MG PO TABS
500.0000 mg | ORAL_TABLET | Freq: Four times a day (QID) | ORAL | Status: DC
  Administered 2018-06-18 – 2018-06-19 (×2): 500 mg via ORAL
  Filled 2018-06-18 (×3): qty 1

## 2018-06-18 MED ORDER — DEXAMETHASONE SODIUM PHOSPHATE 10 MG/ML IJ SOLN
INTRAMUSCULAR | Status: AC
  Filled 2018-06-18: qty 1

## 2018-06-18 MED ORDER — BISACODYL 5 MG PO TBEC: 5.0000 mg | DELAYED_RELEASE_TABLET | Freq: Every day | ORAL | Status: DC | PRN

## 2018-06-18 MED ORDER — HYDROCHLOROTHIAZIDE 12.5 MG PO CAPS
12.5000 mg | ORAL_CAPSULE | Freq: Every day | ORAL | Status: DC
  Administered 2018-06-18 – 2018-06-19 (×2): 12.5 mg via ORAL
  Filled 2018-06-18 (×2): qty 1

## 2018-06-18 MED ORDER — ALUM & MAG HYDROXIDE-SIMETH 200-200-20 MG/5ML PO SUSP: 30.0000 mL | ORAL | Status: DC | PRN

## 2018-06-18 MED ORDER — PROPOFOL 10 MG/ML IV BOLUS
INTRAVENOUS | Status: DC | PRN
  Administered 2018-06-18: 40 mg via INTRAVENOUS

## 2018-06-18 MED ORDER — CLONAZEPAM 1 MG PO TABS
1.0000 mg | ORAL_TABLET | Freq: Every evening | ORAL | Status: DC | PRN
  Administered 2018-06-19: 1 mg via ORAL
  Filled 2018-06-18: qty 1

## 2018-06-18 SURGICAL SUPPLY — 54 items
BLADE SAW SGTL 18X1.27X75 (BLADE) ×2 IMPLANT
CATH FOLEY 2WAY 5CC 16FR (CATHETERS) ×2
CATH URTH STD 16FR FL 2W DRN (CATHETERS) IMPLANT
CELLS DAT CNTRL 66122 CELL SVR (MISCELLANEOUS) ×1 IMPLANT
COVER PERINEAL POST (MISCELLANEOUS) ×2 IMPLANT
COVER SURGICAL LIGHT HANDLE (MISCELLANEOUS) ×2 IMPLANT
COVER WAND RF STERILE (DRAPES) ×2 IMPLANT
CUP ACETABULAR GRIPTON 100 52 (Orthopedic Implant) IMPLANT
DRAPE C-ARM 42X72 X-RAY (DRAPES) ×2 IMPLANT
DRAPE STERI IOBAN 125X83 (DRAPES) ×2 IMPLANT
DRAPE U-SHAPE 47X51 STRL (DRAPES) ×6 IMPLANT
DRSG AQUACEL AG ADV 3.5X10 (GAUZE/BANDAGES/DRESSINGS) ×2 IMPLANT
DRSG OPSITE POSTOP 4X6 (GAUZE/BANDAGES/DRESSINGS) ×2 IMPLANT
DURAPREP 26ML APPLICATOR (WOUND CARE) ×2 IMPLANT
ELECT BLADE 4.0 EZ CLEAN MEGAD (MISCELLANEOUS) ×2
ELECT REM PT RETURN 9FT ADLT (ELECTROSURGICAL) ×2
ELECTRODE BLDE 4.0 EZ CLN MEGD (MISCELLANEOUS) ×1 IMPLANT
ELECTRODE REM PT RTRN 9FT ADLT (ELECTROSURGICAL) ×1 IMPLANT
ELIMINATOR HOLE APEX DEPUY (Hips) ×1 IMPLANT
FACESHIELD WRAPAROUND (MASK) ×4 IMPLANT
FACESHIELD WRAPAROUND OR TEAM (MASK) ×2 IMPLANT
GLOVE BIO SURGEON STRL SZ 6 (GLOVE) ×1 IMPLANT
GLOVE BIO SURGEON STRL SZ8 (GLOVE) ×5 IMPLANT
GLOVE BIOGEL PI IND STRL 8 (GLOVE) ×2 IMPLANT
GLOVE BIOGEL PI IND STRL 8.5 (GLOVE) IMPLANT
GLOVE BIOGEL PI INDICATOR 8 (GLOVE) ×3
GLOVE BIOGEL PI INDICATOR 8.5 (GLOVE) ×1
GOWN STRL REUS W/ TWL LRG LVL3 (GOWN DISPOSABLE) ×1 IMPLANT
GOWN STRL REUS W/ TWL XL LVL3 (GOWN DISPOSABLE) ×2 IMPLANT
GOWN STRL REUS W/TWL LRG LVL3 (GOWN DISPOSABLE) ×2
GOWN STRL REUS W/TWL XL LVL3 (GOWN DISPOSABLE) ×4
GRIPTON 100 52 (Orthopedic Implant) ×2 IMPLANT
HEAD M SROM 36MM 2 (Hips) IMPLANT
KIT BASIN OR (CUSTOM PROCEDURE TRAY) ×2 IMPLANT
KIT TURNOVER KIT B (KITS) ×2 IMPLANT
LINER NEUTRAL 52X36MM PLUS 4 (Liner) ×1 IMPLANT
MANIFOLD NEPTUNE II (INSTRUMENTS) ×2 IMPLANT
NEEDLE HYPO 22GX1.5 SAFETY (NEEDLE) ×2 IMPLANT
NS IRRIG 1000ML POUR BTL (IV SOLUTION) ×2 IMPLANT
PACK TOTAL JOINT (CUSTOM PROCEDURE TRAY) ×2 IMPLANT
PAD ARMBOARD 7.5X6 YLW CONV (MISCELLANEOUS) ×2 IMPLANT
RETRACTOR WND ALEXIS 18 MED (MISCELLANEOUS) ×1 IMPLANT
RTRCTR WOUND ALEXIS 18CM MED (MISCELLANEOUS) ×2
SROM M HEAD 36MM 2 (Hips) ×2 IMPLANT
STEM FEMORAL SZ 6MM STD ACTIS (Stem) ×1 IMPLANT
SUT ETHIBOND NAB CT1 #1 30IN (SUTURE) ×4 IMPLANT
SUT VIC AB 1 CT1 27 (SUTURE) ×2
SUT VIC AB 1 CT1 27XBRD ANBCTR (SUTURE) ×1 IMPLANT
SUT VIC AB 2-0 CT1 27 (SUTURE) ×2
SUT VIC AB 2-0 CT1 TAPERPNT 27 (SUTURE) ×1 IMPLANT
SUT VIC AB 3-0 PS2 18 (SUTURE) ×2
SUT VIC AB 3-0 PS2 18XBRD (SUTURE) ×1 IMPLANT
SUT VLOC 180 0 24IN GS25 (SUTURE) ×2 IMPLANT
SYR 50ML LL SCALE MARK (SYRINGE) ×2 IMPLANT

## 2018-06-18 NOTE — Anesthesia Procedure Notes (Signed)
Procedure Name: MAC Date/Time: 06/18/2018 10:19 AM Performed by: Kyung Rudd, CRNA Pre-anesthesia Checklist: Patient identified, Emergency Drugs available, Suction available and Patient being monitored Patient Re-evaluated:Patient Re-evaluated prior to induction Oxygen Delivery Method: Simple face mask Induction Type: IV induction Placement Confirmation: positive ETCO2

## 2018-06-18 NOTE — Transfer of Care (Signed)
Immediate Anesthesia Transfer of Care Note  Patient: Brittany Washington  Procedure(s) Performed: RIGHT TOTAL HIP ARTHROPLASTY ANTERIOR APPROACH (Right Hip)  Patient Location: PACU  Anesthesia Type:Spinal and MAC combined with regional for post-op pain  Level of Consciousness: awake, alert  and oriented  Airway & Oxygen Therapy: Patient Spontanous Breathing and Patient connected to face mask oxygen  Post-op Assessment: Report given to RN and Post -op Vital signs reviewed and stable  Post vital signs: Reviewed and stable  Last Vitals:  Vitals Value Taken Time  BP 103/79 06/18/2018 12:12 PM  Temp    Pulse 87 06/18/2018 12:16 PM  Resp 16 06/18/2018 12:16 PM  SpO2 100 % 06/18/2018 12:16 PM  Vitals shown include unvalidated device data.  Last Pain:  Vitals:   06/18/18 1213  TempSrc:   PainSc: (P) 0-No pain         Complications: No apparent anesthesia complications

## 2018-06-18 NOTE — Care Plan (Signed)
Ortho Bundle Case Management Note  Patient Details  Name: Brittany Washington MRN: [id number] Date of Birth: September 19, 1950               Spoke with patient. States that she will discharge to home with family and HHPT. She has all needed equipment at home. OPPT has been set up.    DME Arranged:    DME Agency:     HH Arranged:  PT HH Agency:  [facility] ([facility]  Additional Comments: Please contact me with any questions of if this plan should need to change.  Ladell Heads,  [place] Specialist  [phone] 06/18/2018, 1:35 PM

## 2018-06-18 NOTE — Op Note (Signed)

## 2018-06-18 NOTE — Progress Notes (Signed)
Orthopedic Tech Progress Note Patient Details:  Brittany Washington [birth year] [id number]  Ortho Devices Ortho Device/Splint Location: Trapeze bar Ortho Device/Splint Interventions: Application   Post Interventions Patient Tolerated: Well Instructions Provided: Care of device, Adjustment of device   [place] Pink 06/18/2018, 6:32 PM

## 2018-06-18 NOTE — Anesthesia Postprocedure Evaluation (Signed)
Anesthesia Post Note  Patient: Brittany Washington  Procedure(s) Performed: RIGHT TOTAL HIP ARTHROPLASTY ANTERIOR APPROACH (Right Hip)     Patient location during evaluation: PACU Anesthesia Type: Spinal Level of consciousness: awake and alert Pain management: pain level controlled Vital Signs Assessment: post-procedure vital signs reviewed and stable Respiratory status: spontaneous breathing and respiratory function stable Cardiovascular status: blood pressure returned to baseline and stable Postop Assessment: spinal receding Anesthetic complications: no    Last Vitals:  Vitals:   06/18/18 1327 06/18/18 1345  BP: 112/73   Pulse: 79   Resp: 12   Temp:  36.7 C  SpO2: 96%     Last Pain:  Vitals:   06/18/18 1330  TempSrc:   PainSc: Asleep                 Jasim Harari DANIEL

## 2018-06-18 NOTE — Anesthesia Procedure Notes (Signed)
Spinal  Patient location during procedure: OR Start time: 06/18/2018 10:09 AM End time: 06/18/2018 10:13 AM Staffing Anesthesiologist: Duane Boston, MD Performed: anesthesiologist  Preanesthetic Checklist Completed: patient identified, surgical consent, pre-op evaluation, timeout performed, IV checked, risks and benefits discussed and monitors and equipment checked Spinal Block Patient position: sitting Prep: DuraPrep Patient monitoring: cardiac monitor, continuous pulse ox and blood pressure Approach: midline Location: L2-3 Injection technique: single-shot Needle Needle type: Pencan  Needle gauge: 24 G Needle length: 9 cm Additional Notes Functioning IV was confirmed and monitors were applied. Sterile prep and drape, including hand hygiene and sterile gloves were used. The patient was positioned and the spine was prepped. The skin was anesthetized with lidocaine.  Free flow of clear CSF was obtained prior to injecting local anesthetic into the CSF.  The spinal needle aspirated freely following injection.  The needle was carefully withdrawn.  The patient tolerated the procedure well.

## 2018-06-18 NOTE — Interval H&P Note (Signed)
History and Physical Interval Note:  06/18/2018 9:09 AM  Brittany Washington  has presented today for surgery, with the diagnosis of RIGHT HIP DEGENERATIVE JOINT DISEASE  The various methods of treatment have been discussed with the patient and family. After consideration of risks, benefits and other options for treatment, the patient has consented to  Procedure(s): RIGHT TOTAL HIP ARTHROPLASTY ANTERIOR APPROACH (Right) as a surgical intervention .  The patient's history has been reviewed, patient examined, no change in status, stable for surgery.  I have reviewed the patient's chart and labs.  Questions were answered to the patient's satisfaction.     Brach Birdsall G

## 2018-06-18 NOTE — Anesthesia Preprocedure Evaluation (Addendum)
Anesthesia Evaluation  Patient identified by MRN, date of birth, ID band Patient awake    Reviewed: Allergy & Precautions, NPO status , Patient's Chart, lab work & pertinent test results  History of Anesthesia Complications Negative for: history of anesthetic complications  Airway Mallampati: II  TM Distance: >3 FB Neck ROM: Full    Dental no notable dental hx. (+) Dental Advisory Given   Pulmonary neg pulmonary ROS,    Pulmonary exam normal        Cardiovascular hypertension, Pt. on medications Normal cardiovascular exam     Neuro/Psych  Headaches, negative psych ROS   GI/Hepatic negative GI ROS, Neg liver ROS,   Endo/Other  Hypothyroidism   Renal/GU negative Renal ROS  negative genitourinary   Musculoskeletal negative musculoskeletal ROS (+)   Abdominal   Peds negative pediatric ROS (+)  Hematology negative hematology ROS (+)   Anesthesia Other Findings   Reproductive/Obstetrics negative OB ROS                            Anesthesia Physical Anesthesia Plan  ASA: II  Anesthesia Plan: Spinal   Post-op Pain Management:    Induction: Intravenous  PONV Risk Score and Plan: 3 and Ondansetron, Dexamethasone and Propofol infusion  Airway Management Planned: Natural Airway and Simple Face Mask  Additional Equipment:   Intra-op Plan:   Post-operative Plan:   Informed Consent: I have reviewed the patients History and Physical, chart, labs and discussed the procedure including the risks, benefits and alternatives for the proposed anesthesia with the patient or authorized representative who has indicated his/her understanding and acceptance.   Dental advisory given  Plan Discussed with: Anesthesiologist, CRNA and Surgeon  Anesthesia Plan Comments:        Anesthesia Quick Evaluation

## 2018-06-18 NOTE — Evaluation (Signed)
Physical Therapy Evaluation Patient Details Name: Brittany Washington MRN: [id number] DOB: Jul 23, 1951 Today's Date: 06/18/2018   History of Present Illness  67 y.o. female admitted on 06/18/18 for R direct ant THA, WBAT post op.  Pt with significant PMH of HTN, heart murmur systolic, osteoporosis, L wrist fx surgery, lumbar spine surgery (multiple).    Clinical Impression  Pt is POD #0 and is moving well, min assist overall with short distance gait in her room with RW.  She will likely progress quickly and has good family support at d/c.   PT to follow acutely for deficits listed below.      Follow Up Recommendations Home health PT;Supervision for mobility/OOB;Follow surgeon's recommendation for DC plan and follow-up therapies    Equipment Recommendations  None recommended by PT    Recommendations for Other Services    NA     Precautions / Restrictions Precautions Precautions: None Restrictions Weight Bearing Restrictions: Yes RLE Weight Bearing: Weight bearing as tolerated      Mobility  Bed Mobility Overal bed mobility: Needs Assistance Bed Mobility: Supine to Sit     Supine to sit: Min assist     General bed mobility comments: Min assist to help progress right leg to EOB. Hand held assist at trunk to pull up to sitting, verbal cues for sequencing.   Transfers Overall transfer level: Needs assistance Equipment used: Rolling walker (2 wheeled) Transfers: Sit to/from Stand Sit to Stand: Min guard         General transfer comment: Min guard assist for safety, verbal cues for safe hand placement and RW Use   Ambulation/Gait Ambulation/Gait assistance: Min assist Gait Distance (Feet): 20 Feet Assistive device: Rolling walker (2 wheeled) Gait Pattern/deviations: Step-to pattern;Antalgic     General Gait Details: Verbal cues for LE sequencing and safe RW use, verbal cues to breathe throughout and for upright posture.          Balance Overall balance  assessment: Needs assistance Sitting-balance support: Feet supported;No upper extremity supported Sitting balance-Leahy Scale: Good     Standing balance support: Bilateral upper extremity supported Standing balance-Leahy Scale: Fair                               Pertinent Vitals/Pain Pain Assessment: 0-10 Pain Score: 7  Pain Location: right hip Pain Descriptors / Indicators: Burning;Grimacing;Guarding Pain Intervention(s): Limited activity within patient's tolerance;Monitored during session;Repositioned;Ice applied    Home Living Family/patient expects to be discharged to:: Private residence Living Arrangements: Spouse/significant other Available Help at Discharge: Family;Available 24 hours/day Type of Home: House Home Access: Stairs to enter Entrance Stairs-Rails: None Entrance Stairs-Number of Steps: 8 Home Layout: Two level;Able to live on main level with bedroom/bathroom Home Equipment: Gilford Rile - 2 wheels;Cane - quad;Shower seat;Bedside commode;Grab bars - tub/shower;Grab bars - toilet      Prior Function Level of Independence: Independent               Hand Dominance   Dominant Hand: Right    Extremity/Trunk Assessment   Upper Extremity Assessment Upper Extremity Assessment: Overall WFL for tasks assessed    Lower Extremity Assessment Lower Extremity Assessment: RLE deficits/detail RLE Deficits / Details: right leg with normal post op pain and weakness, ankle at least 3/5, knee 3/5 ext, hip 2/5 flexion/abduct    Cervical / Trunk Assessment Cervical / Trunk Assessment: Other exceptions Cervical / Trunk Exceptions: h/o multiple lumber surgeries  Communication  Communication: No difficulties  Cognition Arousal/Alertness: Awake/alert Behavior During Therapy: WFL for tasks assessed/performed Overall Cognitive Status: Within Functional Limits for tasks assessed                                           Exercises Total Joint  Exercises Ankle Circles/Pumps: AROM;Both;20 reps Quad Sets: AROM;Right;10 reps Heel Slides: AAROM;Right;10 reps Hip ABduction/ADduction: AAROM;Right;10 reps   Assessment/Plan    PT Assessment Patient needs continued PT services  PT Problem List Decreased strength;Decreased range of motion;Decreased activity tolerance;Decreased balance;Decreased mobility;Decreased knowledge of use of DME;Pain       PT Treatment Interventions DME instruction;Gait training;Stair training;Therapeutic activities;Functional mobility training;Therapeutic exercise;Balance training;Patient/family education;Manual techniques;Modalities    PT Goals (Current goals can be found in the Care Plan section)  Acute Rehab PT Goals Patient Stated Goal: to get back to being active again PT Goal Formulation: With patient Time For Goal Achievement: 06/25/18 Potential to Achieve Goals: Good    Frequency 7X/week           AM-PAC PT "6 Clicks" Daily Activity  Outcome Measure Difficulty turning over in bed (including adjusting bedclothes, sheets and blankets)?: Unable Difficulty moving from lying on back to sitting on the side of the bed? : Unable Difficulty sitting down on and standing up from a chair with arms (e.g., wheelchair, bedside commode, etc,.)?: Unable Help needed moving to and from a bed to chair (including a wheelchair)?: A Little Help needed walking in hospital room?: A Little Help needed climbing 3-5 steps with a railing? : A Little 6 Click Score: 12    End of Session Equipment Utilized During Treatment: Gait belt Activity Tolerance: Patient limited by pain Patient left: in chair;with call bell/phone within reach   PT Visit Diagnosis: Muscle weakness (generalized) (M62.81);Difficulty in walking, not elsewhere classified (R26.2);Pain Pain - Right/Left: Right Pain - part of body: Hip    Time: 4193-7902 PT Time Calculation (min) (ACUTE ONLY): 39 min   Charges:            Wells Guiles B.  Madelyn Tlatelpa, PT, DPT  Acute Rehabilitation [phone] pager #[phone] office   PT Evaluation $PT Eval Moderate Complexity: 1 Mod PT Treatments $Gait Training: 8-22 mins $Therapeutic Activity: 8-22 mins        06/18/2018, 5:32 PM

## 2018-06-19 ENCOUNTER — Encounter (HOSPITAL_COMMUNITY): Payer: Self-pay | Admitting: Orthopaedic Surgery

## 2018-06-19 LAB — URINE CULTURE: Culture: <10000 — AB

## 2018-06-19 MED ORDER — TIZANIDINE HCL 4 MG PO TABS: 4.0000 mg | ORAL_TABLET | Freq: Four times a day (QID) | ORAL | 1 refills | Status: DC | PRN

## 2018-06-19 MED ORDER — HYDROCODONE-ACETAMINOPHEN 5-325 MG PO TABS: 1.0000 | ORAL_TABLET | Freq: Four times a day (QID) | ORAL | 0 refills | Status: DC | PRN

## 2018-06-19 MED ORDER — ASPIRIN 325 MG PO TBEC: 325.0000 mg | DELAYED_RELEASE_TABLET | Freq: Two times a day (BID) | ORAL | 0 refills | Status: DC

## 2018-06-19 MED FILL — tiZANidine HCL 4 MG TABS: 4 | 10 days supply | Qty: 40 | Fill #0

## 2018-06-19 MED FILL — HYDROCODON-APAP 5-325: 5-325 | 5 days supply | Qty: 40 | Fill #0

## 2018-06-19 NOTE — Progress Notes (Signed)
Subjective: 1 Day Post-Op Procedure(s) (LRB): RIGHT TOTAL HIP ARTHROPLASTY ANTERIOR APPROACH (Right)   Patient doing well with no significant pain. She wants to see how she does with PT to see if she can go home today.  Activity level:  wbat Diet tolerance:  ok Voiding:  Foley out his morning. Patient reports pain as mild.    Objective: Vital signs in last 24 hours: Temp:  [97.2 F (36.2 C)-98.2 F (36.8 C)] 98.1 F (36.7 C) (10/16 0408) Pulse Rate:  [76-105] 88 (10/16 0408) Resp:  [9-22] 16 (10/16 0408) BP: (103-164)/(60-92) 115/77 (10/16 0408) SpO2:  [94 %-100 %] 95 % (10/16 0408) Weight:  [70.3 kg] 70.3 kg (10/15 1641)  Labs: No results for input(s): HGB in the last 72 hours. No results for input(s): WBC, RBC, HCT, PLT in the last 72 hours. No results for input(s): NA, K, CL, CO2, BUN, CREATININE, GLUCOSE, CALCIUM in the last 72 hours. No results for input(s): LABPT, INR in the last 72 hours.  Physical Exam:  Neurologically intact ABD soft Neurovascular intact Sensation intact distally Intact pulses distally Dorsiflexion/Plantar flexion intact Incision: dressing C/D/I and no drainage No cellulitis present Compartment soft  Assessment/Plan:  1 Day Post-Op Procedure(s) (LRB): RIGHT TOTAL HIP ARTHROPLASTY ANTERIOR APPROACH (Right) Advance diet Up with therapy Discharge home with home health Either today or tomorrow depending on how she does with PT.  Continue on ASA 325mg  BID x 4 weeks Follow up in office 2 weeks post op.  Brittany Washington, Brittany Washington 06/19/2018, 7:44 AM

## 2018-06-19 NOTE — Evaluation (Signed)
Occupational Therapy Evaluation and Discharge Patient Details Name: Brittany Washington MRN: [id number] DOB: 11-08-1950 Today's Date: 06/19/2018    History of Present Illness 67 y.o. female admitted on 06/18/18 for R direct ant THA, WBAT post op.  Pt with significant PMH of HTN, heart murmur systolic, osteoporosis, L wrist fx surgery, lumbar spine surgery (multiple).     Clinical Impression   PTA Pt required assistance for LB dressing (limited due to pain with ROM) Pt is currently mod A for LB ADL. Verbally educated in [place] and compensatory strategies as well as shower transfer, safety in shower, and that with her type of sx she does not have precautions for bending or crossing legs for independence with ADL. Education complete. Pt with no questions or concerns at the end of session. OT to sign off at this time Thank you for the opportunity to serve this patient.     Follow Up Recommendations  No OT follow up;Supervision - Intermittent;Follow surgeon's recommendation for DC plan and follow-up therapies    Equipment Recommendations  None recommended by OT(Pt has appropriate DME - declining AE at this time)    Recommendations for Other Services       Precautions / Restrictions Precautions Precautions: None Restrictions Weight Bearing Restrictions: Yes RLE Weight Bearing: Weight bearing as tolerated      Mobility Bed Mobility               General bed mobility comments: Pt in recliner at beginning and end of session  Transfers Overall transfer level: Needs assistance Equipment used: Rolling walker (2 wheeled) Transfers: Sit to/from Stand Sit to Stand: Supervision         General transfer comment: for safety    Balance Overall balance assessment: Needs assistance Sitting-balance support: Feet supported;No upper extremity supported Sitting balance-Leahy Scale: Good     Standing balance support: Bilateral upper extremity supported Standing balance-Leahy Scale:  Fair Standing balance comment: able to static stand without UE support                           ADL either performed or assessed with clinical judgement   ADL Overall ADL's : Needs assistance/impaired Eating/Feeding: Independent   Grooming: Supervision/safety;Standing Grooming Details (indicate cue type and reason): sink level Upper Body Bathing: Modified independent   Lower Body Bathing: Supervison/ safety Lower Body Bathing Details (indicate cue type and reason): educated on use of long handle sponge Upper Body Dressing : Independent   Lower Body Dressing: Moderate assistance;Sit to/from stand Lower Body Dressing Details (indicate cue type and reason): Pt unable to achieve "figure 4" for LB dressing - will have husband assistance at dc - uninterested in AE at this time - verbally educated in options Toilet Transfer: Supervision/safety;Ambulation;RW   Toileting- Clothing Manipulation and Hygiene: Supervision/safety;Sit to/from stand   Tub/ Shower Transfer: Walk-in shower;Supervision/safety;Ambulation;Shower seat;Grab bars   Functional mobility during ADLs: Supervision/safety;Rolling walker General ADL Comments: limited access to LB for dressing/bathing due to pain/anxious about bending to access LB at this time     Vision Patient Visual Report: No change from baseline       Perception     Praxis      Pertinent Vitals/Pain Pain Assessment: Faces Faces Pain Scale: Hurts a little bit Pain Location: right hip/thigh Pain Descriptors / Indicators: Cramping Pain Intervention(s): Limited activity within patient's tolerance;Monitored during session;Repositioned;Ice applied     Hand Dominance     Extremity/Trunk Assessment Upper Extremity Assessment  Upper Extremity Assessment: Overall WFL for tasks assessed   Lower Extremity Assessment Lower Extremity Assessment: Defer to PT evaluation       Communication Communication Communication: No difficulties    Cognition Arousal/Alertness: Awake/alert Behavior During Therapy: WFL for tasks assessed/performed Overall Cognitive Status: Within Functional Limits for tasks assessed                                     General Comments       Exercises     Shoulder Instructions      Home Living Family/patient expects to be discharged to:: Private residence Living Arrangements: Spouse/significant other Available Help at Discharge: Family;Available 24 hours/day Type of Home: House Home Access: Stairs to enter [employer] of Steps: 8 Entrance Stairs-Rails: None Home Layout: Two level;Able to live on main level with bedroom/bathroom     Bathroom Shower/Tub: Walk-in shower   Bathroom Toilet: Handicapped height     Home Equipment: Environmental consultant - 2 wheels;Cane - quad;Shower seat;Bedside commode;Grab bars - tub/shower;Grab bars - toilet          Prior Functioning/Environment Level of Independence: Independent(but unable to don socks recently due to pain)                 OT Problem List: Decreased activity tolerance;Impaired balance (sitting and/or standing);Decreased knowledge of use of DME or AE;Pain      OT Treatment/Interventions:      OT Goals(Current goals can be found in the care plan section) Acute Rehab OT Goals Patient Stated Goal: "be able to put on my own socks" OT Goal Formulation: With patient Time For Goal Achievement: 07/03/18 Potential to Achieve Goals: Good  OT Frequency:     Barriers to D/C:            Co-evaluation              AM-PAC PT "6 Clicks" Daily Activity     Outcome Measure Help from another person eating meals?: None Help from another person taking care of personal grooming?: None Help from another person toileting, which includes using toliet, bedpan, or urinal?: None Help from another person bathing (including washing, rinsing, drying)?: A Little Help from another person to put on and taking off regular upper body  clothing?: None Help from another person to put on and taking off regular lower body clothing?: A Lot 6 Click Score: 21   End of Session Equipment Utilized During Treatment: Gait belt;Rolling walker Nurse Communication: Mobility status  Activity Tolerance: Patient tolerated treatment well Patient left: in chair  OT Visit Diagnosis: Other abnormalities of gait and mobility (R26.89);Pain Pain - Right/Left: Right Pain - part of body: Hip                Time: 8315-1761 OT Time Calculation (min): 31 min Charges:  OT General Charges $OT Visit: 1 Visit OT Evaluation $OT Eval Low Complexity: 1 Low OT Treatments $Self Care/Home Management : 8-22 mins  Hulda Humphrey OTR/L Acute Rehabilitation Services Pager: [phone] Office: [place] 06/19/2018, 1:59 PM

## 2018-06-19 NOTE — Progress Notes (Signed)
Patient discharging home today. Discharge instructions explained to patient and she verbalized understanding. Took all her personal belongings. No further questions or concerns voiced.

## 2018-06-19 NOTE — Plan of Care (Signed)
  Problem: Education: Goal: Knowledge of General Education information will improve Description: Including pain rating scale, medication(s)/side effects and non-pharmacologic comfort measures Outcome: Progressing   Problem: Activity: Goal: Risk for activity intolerance will decrease Outcome: Progressing   Problem: Elimination: Goal: Will not experience complications related to urinary retention Outcome: Progressing   Problem: Pain Managment: Goal: General experience of comfort will improve Outcome: Progressing   

## 2018-06-19 NOTE — Discharge Summary (Signed)
Patient ID: Brittany Washington MRN: [id number] DOB/AGE: [birth year] 67 y.o.  Admit date: 06/18/2018 Discharge date: 06/19/2018  Admission Diagnoses:  Principal Problem:   Primary osteoarthritis of right hip   Discharge Diagnoses:  Same  Past Medical History:  Diagnosis Date  . Benign hypertensive heart disease without heart failure   . Heart murmur, systolic    MECHNICAL  . Hypertension    mild, lost weight, no meds now  . Hypothyroidism   . Osteoporosis   . Primary osteoarthritis of right hip 06/18/2018    Surgeries: Procedure(s): RIGHT TOTAL HIP ARTHROPLASTY ANTERIOR APPROACH on 06/18/2018   Consultants:   Discharged Condition: Improved  Hospital Course: Brittany Washington is an 67 y.o. female who was admitted 06/18/2018 for operative treatment ofPrimary osteoarthritis of right hip. Patient has severe unremitting pain that affects sleep, daily activities, and work/hobbies. After pre-op clearance the patient was taken to the operating room on 06/18/2018 and underwent  Procedure(s): RIGHT TOTAL HIP ARTHROPLASTY ANTERIOR APPROACH.    Patient was given perioperative antibiotics:  Anti-infectives (From admission, onward)   Start     Dose/Rate Route Frequency Ordered Stop   06/18/18 1700  ceFAZolin (ANCEF) IVPB 2g/100 mL premix     2 g 200 mL/hr over 30 Minutes Intravenous Every 6 hours 06/18/18 1628 06/19/18 0100   06/18/18 0815  ceFAZolin (ANCEF) IVPB 2g/100 mL premix  Status:  Discontinued     2 g 200 mL/hr over 30 Minutes Intravenous On call to O.R. 06/18/18 0807 06/18/18 1627       Patient was given sequential compression devices, early ambulation, and chemoprophylaxis to prevent DVT.  Patient benefited maximally from hospital stay and there were no complications.    Recent vital signs:  Patient Vitals for the past 24 hrs:  BP Temp Temp src Pulse Resp SpO2 Height Weight  06/19/18 1323 (!) 156/82 97.9 F (36.6 C) Oral 98 16 98 % - -  06/19/18 0408 115/77 98.1 F  (36.7 C) Oral 88 16 95 % - -  06/19/18 0033 118/75 98.2 F (36.8 C) Oral 89 18 95 % - -  06/18/18 1940 128/90 98.1 F (36.7 C) Oral (!) 105 16 95 % - -  06/18/18 1641 110/60 (!) 97.2 F (36.2 C) Oral 96 20 96 % 5\' 4"  (1.626 m) 70.3 kg  06/18/18 1610 - - - 95 (!) 21 97 % - -  06/18/18 1600 - (!) 97.2 F (36.2 C) - 96 15 98 % - -  06/18/18 1557 128/80 - - 95 (!) 22 97 % - -  06/18/18 1545 - - - 88 17 98 % - -  06/18/18 1530 - - - 91 17 99 % - -  06/18/18 1515 - - - 95 16 99 % - -  06/18/18 1500 - - - 91 16 94 % - -  06/18/18 1457 133/78 - - 92 20 96 % - -  06/18/18 1456 - - - 93 18 97 % - -  06/18/18 1445 - - - 93 13 98 % - -  06/18/18 1430 - - - 88 18 96 % - -  06/18/18 1428 - - - 89 19 100 % - -  06/18/18 1427 120/82 - - 88 19 98 % - -  06/18/18 1415 - - - 86 14 96 % - -  06/18/18 1400 - - - 87 10 97 % - -  06/18/18 1357 113/75 - - 87 13 95 % - -  06/18/18 1345 -  98.1 F (36.7 C) - 81 13 95 % - -     Recent laboratory studies: No results for input(s): WBC, HGB, HCT, PLT, NA, K, CL, CO2, BUN, CREATININE, GLUCOSE, INR, CALCIUM in the last 72 hours.  Invalid input(s): PT, 2   Discharge Medications:   Allergies as of 06/19/2018      Reactions   Sulfa Antibiotics Diarrhea, Other (See Comments)   Body aches       Medication List    STOP taking these medications   amoxicillin-clavulanate 875-125 MG tablet Commonly known as:  AUGMENTIN   ibuprofen 600 MG tablet Commonly known as:  ADVIL,MOTRIN     TAKE these medications   aspirin 325 MG EC tablet Take 1 tablet (325 mg total) by mouth 2 (two) times daily after a meal.   cetirizine 10 MG tablet Commonly known as:  ZYRTEC Take 10 mg by mouth at bedtime.   clonazePAM 1 MG tablet Commonly known as:  KLONOPIN Take 1 tablet (1 mg total) by mouth at bedtime. Take 1-2 tablets by mouth daily at bedtime as needed for sleep. What changed:    how much to take  when to take this  reasons to take this  additional  instructions   cyclobenzaprine 10 MG tablet Commonly known as:  FLEXERIL Take 10 mg by mouth at bedtime as needed for muscle spasms.   hydrochlorothiazide 12.5 MG capsule Commonly known as:  MICROZIDE TAKE 1 CAPSULE BY MOUTH DAILY.   HYDROcodone-acetaminophen 5-325 MG tablet Commonly known as:  NORCO/VICODIN Take 1-2 tablets by mouth every 6 (six) hours as needed (pain). What changed:    how much to take  when to take this   ketotifen 0.025 % ophthalmic solution Commonly known as:  ZADITOR Place 1 drop into both eyes daily.   levothyroxine 112 MCG tablet Commonly known as:  SYNTHROID, LEVOTHROID TAKE 1 TABLET BY MOUTH ONCE DAILY What changed:  when to take this   pantoprazole 40 MG tablet Commonly known as:  PROTONIX Take 40 mg by mouth daily as needed (for acid reflux).   tiZANidine 4 MG tablet Commonly known as:  ZANAFLEX Take 1 tablet (4 mg total) by mouth every 6 (six) hours as needed.   Vitamin D3 400 units Caps Take 400 Units by mouth daily.            Durable Medical Equipment  (From admission, onward)         Start     Ordered   06/18/18 1629  DME Walker rolling  Once    Question:  Patient needs a walker to treat with the following condition  Answer:  Primary osteoarthritis of right hip   06/18/18 1628   06/18/18 1629  DME 3 n 1  Once     06/18/18 1628   06/18/18 1629  DME Bedside commode  Once    Question:  Patient needs a bedside commode to treat with the following condition  Answer:  Primary osteoarthritis of right hip   06/18/18 1628          Diagnostic Studies: Dg Chest 2 View  Result Date: 06/06/2018 CLINICAL DATA:  Preop for right hip arthroplasty. EXAM: CHEST - 2 VIEW COMPARISON:  Radiographs of June 25, 2015. FINDINGS: The heart size and mediastinal contours are within normal limits. Both lungs are clear. The visualized skeletal structures are unremarkable. IMPRESSION: No active cardiopulmonary disease. Electronically Signed   By:  Marijo Conception, M.D.   On: 06/06/2018 14:18  Dg C-arm 1-60 Min  Result Date: 06/18/2018 CLINICAL DATA:  Right total hip arthroplasty. EXAM: OPERATIVE RIGHT HIP (WITH PELVIS IF PERFORMED) TECHNIQUE: Fluoroscopic spot image(s) were submitted for interpretation post-operatively. FLUOROSCOPY TIME:  C-arm fluoroscopic images were obtained intraoperatively and submitted for post operative interpretation. Please see the performing provider's procedural report for the fluoroscopy time utilized. COMPARISON:  None. FINDINGS: Two intraoperative fluoroscopic images in the frontal projection are provided. Sequelae of right total hip arthroplasty are identified. The prosthetic femoral and acetabular components are approximated with each other on these limited images. IMPRESSION: Intraoperative images during right total hip arthroplasty. Electronically Signed   By: Logan Bores M.D.   On: 06/18/2018 11:53   Dg Hip Operative Unilat W Or W/o Pelvis Right  Result Date: 06/18/2018 CLINICAL DATA:  Right total hip arthroplasty. EXAM: OPERATIVE RIGHT HIP (WITH PELVIS IF PERFORMED) TECHNIQUE: Fluoroscopic spot image(s) were submitted for interpretation post-operatively. FLUOROSCOPY TIME:  C-arm fluoroscopic images were obtained intraoperatively and submitted for post operative interpretation. Please see the performing provider's procedural report for the fluoroscopy time utilized. COMPARISON:  None. FINDINGS: Two intraoperative fluoroscopic images in the frontal projection are provided. Sequelae of right total hip arthroplasty are identified. The prosthetic femoral and acetabular components are approximated with each other on these limited images. IMPRESSION: Intraoperative images during right total hip arthroplasty. Electronically Signed   By: Logan Bores M.D.   On: 06/18/2018 11:53    Disposition: Discharge disposition: 01-Home or Self Care       Discharge Instructions    Call MD / Call 911   Complete by:  As  directed    If you experience chest pain or shortness of breath, CALL 911 and be transported to the hospital emergency room.  If you develope a fever above 101 F, pus (white drainage) or increased drainage or redness at the wound, or calf pain, call your surgeon's office.   Constipation Prevention   Complete by:  As directed    Drink plenty of fluids.  Prune juice may be helpful.  You may use a stool softener, such as Colace (over the counter) 100 mg twice a day.  Use MiraLax (over the counter) for constipation as needed.   Diet - low sodium heart healthy   Complete by:  As directed    Discharge instructions   Complete by:  As directed    INSTRUCTIONS AFTER JOINT REPLACEMENT   Remove items at home which could result in a fall. This includes throw rugs or furniture in walking pathways ICE to the affected joint every three hours while awake for 30 minutes at a time, for at least the first 3-5 days, and then as needed for pain and swelling.  Continue to use ice for pain and swelling. You may notice swelling that will progress down to the foot and ankle.  This is normal after surgery.  Elevate your leg when you are not up walking on it.   Continue to use the breathing machine you got in the hospital (incentive spirometer) which will help keep your temperature down.  It is common for your temperature to cycle up and down following surgery, especially at night when you are not up moving around and exerting yourself.  The breathing machine keeps your lungs expanded and your temperature down.   DIET:  As you were doing prior to hospitalization, we recommend a well-balanced diet.  DRESSING / WOUND CARE / SHOWERING  You may shower 3 days after surgery, but keep the wounds  dry during showering.  You may use an occlusive plastic wrap (Press'n Seal for example), NO SOAKING/SUBMERGING IN THE BATHTUB.  If the bandage gets wet, change with a clean dry gauze.  If the incision gets wet, pat the wound dry with a  clean towel.  ACTIVITY  Increase activity slowly as tolerated, but follow the weight bearing instructions below.   No driving for 6 weeks or until further direction given by your physician.  You cannot drive while taking narcotics.  No lifting or carrying greater than 10 lbs. until further directed by your surgeon. Avoid periods of inactivity such as sitting longer than an hour when not asleep. This helps prevent blood clots.  You may return to work once you are authorized by your doctor.     WEIGHT BEARING   Weight bearing as tolerated with assist device (walker, cane, etc) as directed, use it as long as suggested by your surgeon or therapist, typically at least 4-6 weeks.   EXERCISES  Results after joint replacement surgery are often greatly improved when you follow the exercise, range of motion and muscle strengthening exercises prescribed by your doctor. Safety measures are also important to protect the joint from further injury. Any time any of these exercises cause you to have increased pain or swelling, decrease what you are doing until you are comfortable again and then slowly increase them. If you have problems or questions, call your caregiver or physical therapist for advice.   Rehabilitation is important following a joint replacement. After just a few days of immobilization, the muscles of the leg can become weakened and shrink (atrophy).  These exercises are designed to build up the tone and strength of the thigh and leg muscles and to improve motion. Often times heat used for twenty to thirty minutes before working out will loosen up your tissues and help with improving the range of motion but do not use heat for the first two weeks following surgery (sometimes heat can increase post-operative swelling).   These exercises can be done on a training (exercise) mat, on the floor, on a table or on a bed. Use whatever works the best and is most comfortable for you.    Use music or  television while you are exercising so that the exercises are a pleasant break in your day. This will make your life better with the exercises acting as a break in your routine that you can look forward to.   Perform all exercises about fifteen times, three times per day or as directed.  You should exercise both the operative leg and the other leg as well.   Exercises include:   Quad Sets - Tighten up the muscle on the front of the thigh (Quad) and hold for 5-10 seconds.   Straight Leg Raises - With your knee straight (if you were given a brace, keep it on), lift the leg to 60 degrees, hold for 3 seconds, and slowly lower the leg.  Perform this exercise against resistance later as your leg gets stronger.  Leg Slides: Lying on your back, slowly slide your foot toward your buttocks, bending your knee up off the floor (only go as far as is comfortable). Then slowly slide your foot back down until your leg is flat on the floor again.  Angel Wings: Lying on your back spread your legs to the side as far apart as you can without causing discomfort.  Hamstring Strength:  Lying on your back, push your heel against the  floor with your leg straight by tightening up the muscles of your buttocks.  Repeat, but this time bend your knee to a comfortable angle, and push your heel against the floor.  You may put a pillow under the heel to make it more comfortable if necessary.   A rehabilitation program following joint replacement surgery can speed recovery and prevent re-injury in the future due to weakened muscles. Contact your doctor or a physical therapist for more information on knee rehabilitation.    CONSTIPATION  Constipation is defined medically as fewer than three stools per week and severe constipation as less than one stool per week.  Even if you have a regular bowel pattern at home, your normal regimen is likely to be disrupted due to multiple reasons following surgery.  Combination of anesthesia,  postoperative narcotics, change in appetite and fluid intake all can affect your bowels.   YOU MUST use at least one of the following options; they are listed in order of increasing strength to get the job done.  They are all available over the counter, and you may need to use some, POSSIBLY even all of these options:    Drink plenty of fluids (prune juice may be helpful) and high fiber foods Colace 100 mg by mouth twice a day  Senokot for constipation as directed and as needed Dulcolax (bisacodyl), take with full glass of water  Miralax (polyethylene glycol) once or twice a day as needed.  If you have tried all these things and are unable to have a bowel movement in the first 3-4 days after surgery call either your surgeon or your primary doctor.    If you experience loose stools or diarrhea, hold the medications until you stool forms back up.  If your symptoms do not get better within 1 week or if they get worse, check with your doctor.  If you experience "the worst abdominal pain ever" or develop nausea or vomiting, please contact the office immediately for further recommendations for treatment.   ITCHING:  If you experience itching with your medications, try taking only a single pain pill, or even half a pain pill at a time.  You can also use Benadryl over the counter for itching or also to help with sleep.   TED HOSE STOCKINGS:  Use stockings on both legs until for at least 2 weeks or as directed by physician office. They may be removed at night for sleeping.  MEDICATIONS:  See your medication summary on the "After Visit Summary" that nursing will review with you.  You may have some home medications which will be placed on hold until you complete the course of blood thinner medication.  It is important for you to complete the blood thinner medication as prescribed.  PRECAUTIONS:  If you experience chest pain or shortness of breath - call 911 immediately for transfer to the hospital emergency  department.   If you develop a fever greater that 101 F, purulent drainage from wound, increased redness or drainage from wound, foul odor from the wound/dressing, or calf pain - CONTACT YOUR SURGEON.                                                   FOLLOW-UP APPOINTMENTS:  If you do not already have a post-op appointment, please call the office for an appointment to  be seen by your surgeon.  Guidelines for how soon to be seen are listed in your "After Visit Summary", but are typically between 1-4 weeks after surgery.  OTHER INSTRUCTIONS:   Knee Replacement:  Do not place pillow under knee, focus on keeping the knee straight while resting. CPM instructions: 0-90 degrees, 2 hours in the morning, 2 hours in the afternoon, and 2 hours in the evening. Place foam block, curve side up under heel at all times except when in CPM or when walking.  DO NOT modify, tear, cut, or change the foam block in any way.  MAKE SURE YOU:  Understand these instructions.  Get help right away if you are not doing well or get worse.    Thank you for letting us be a part of your medical care team.  It is a privilege we respect greatly.  We hope these instructions will help you stay on track for a fast and full recovery!   Increase activity slowly as tolerated   Complete by:  As directed       Follow-up Information    [name], MD. Go on 06/28/2018.   Specialty:  Orthopedic Surgery Why:  Your appointment is set for 9:00a  Contact information: [place] [address] [place], [place] [address]. Go on 07/02/2018.   Specialty:  Physical Therapy Why:  Your OPPT, if needed, is schedulded to beging on 07/02/18 at 12:45. Please arrive 15 minutes early to complete your paperwork.  The phone number for [facility] is [phone] Contact information: [place] [address] [phone]        Home, [facility] Follow up.   Specialty:  Home Health  Services Why:  Will provide 5 HHPT sessions at home prior to your return visit to see Dr Rhona Raider.  Contact information: [address] [place] [address] [phone]            Signed: Rich Fuchs 06/19/2018, 1:43 PM

## 2018-06-19 NOTE — Progress Notes (Signed)
Physical Therapy Treatment Patient Details Name: Brittany Washington MRN: [id number] DOB: 10/28/1950 Today's Date: 06/19/2018    History of Present Illness 67 y.o. female admitted on 06/18/18 for R direct ant THA, WBAT post op.  Pt with significant PMH of HTN, heart murmur systolic, osteoporosis, L wrist fx surgery, lumbar spine surgery (multiple).      PT Comments    Patient is progressing well toward PT goals and tolerated gait/stair training without c/o increased pain. Pt is able to ascend/descend 9 steps X3 trials simulating home entrance and overall requires supervision/min guard for safe OOB mobility. Continue to progress as tolerated. Current plan remains appropriate.    Follow Up Recommendations  Home health PT;Supervision for mobility/OOB;Follow surgeon's recommendation for DC plan and follow-up therapies     Equipment Recommendations  None recommended by PT    Recommendations for Other Services       Precautions / Restrictions Precautions Precautions: None Restrictions Weight Bearing Restrictions: Yes RLE Weight Bearing: Weight bearing as tolerated    Mobility  Bed Mobility               General bed mobility comments: pt standing in bathroom upon arrival  Transfers Overall transfer level: Needs assistance Equipment used: Rolling walker (2 wheeled) Transfers: Sit to/from Stand Sit to Stand: Supervision         General transfer comment: for safety  Ambulation/Gait Ambulation/Gait assistance: Supervision Gait Distance (Feet): 175 Feet Assistive device: Rolling walker (2 wheeled) Gait Pattern/deviations: Step-through pattern;Decreased stride length     General Gait Details: cues for step length symmetry and safe proximity to RW   Stairs Stairs: Yes Stairs assistance: Min guard;Min assist Stair Management: No rails;Backwards;Step to pattern;With walker;One rail Right;Sideways Number of Stairs: (9 steps X 3 trials) General stair comments:  practiced stairs simulating home entrance using RW backwards for 3 steps and then R hand rail sideways for 6 steps X 3 trials with carry over demonstrated   Wheelchair Mobility    Modified Rankin (Stroke Patients Only)       Balance Overall balance assessment: Needs assistance Sitting-balance support: Feet supported;No upper extremity supported Sitting balance-Leahy Scale: Good     Standing balance support: Bilateral upper extremity supported Standing balance-Leahy Scale: Fair Standing balance comment: able to static stand without UE support                            Cognition Arousal/Alertness: Awake/alert Behavior During Therapy: WFL for tasks assessed/performed Overall Cognitive Status: Within Functional Limits for tasks assessed                                        Exercises      General Comments        Pertinent Vitals/Pain Pain Assessment: Faces Faces Pain Scale: Hurts a little bit Pain Location: right hip/thigh Pain Descriptors / Indicators: Cramping Pain Intervention(s): Monitored during session;Repositioned;Patient requesting pain meds-RN notified    Home Living                      Prior Function            PT Goals (current goals can now be found in the care plan section) Progress towards PT goals: Progressing toward goals    Frequency    7X/week      PT Plan Current  plan remains appropriate    Co-evaluation              AM-PAC PT "6 Clicks" Daily Activity  Outcome Measure  Difficulty turning over in bed (including adjusting bedclothes, sheets and blankets)?: A Lot Difficulty moving from lying on back to sitting on the side of the bed? : A Lot Difficulty sitting down on and standing up from a chair with arms (e.g., wheelchair, bedside commode, etc,.)?: Unable Help needed moving to and from a bed to chair (including a wheelchair)?: None Help needed walking in hospital room?: A Little Help  needed climbing 3-5 steps with a railing? : A Little 6 Click Score: 15    End of Session Equipment Utilized During Treatment: Gait belt Activity Tolerance: Patient tolerated treatment well Patient left: in chair;with call bell/phone within reach Nurse Communication: Mobility status PT Visit Diagnosis: Muscle weakness (generalized) (M62.81);Difficulty in walking, not elsewhere classified (R26.2);Pain Pain - Right/Left: Right Pain - part of body: Hip     Time: 0928-0958 PT Time Calculation (min) (ACUTE ONLY): 30 min  Charges:  $Gait Training: 8-22 mins $Therapeutic Activity: 8-22 mins                     Earney Navy, PTA Acute Rehabilitation Services Pager: [phone] Office: [phone]     Darliss Cheney 06/19/2018, 10:50 AM

## 2018-06-20 DIAGNOSIS — Z96641 Presence of right artificial hip joint: Secondary | ICD-10-CM | POA: Diagnosis not present

## 2018-06-20 DIAGNOSIS — Z471 Aftercare following joint replacement surgery: Secondary | ICD-10-CM | POA: Diagnosis not present

## 2018-06-20 DIAGNOSIS — M810 Age-related osteoporosis without current pathological fracture: Secondary | ICD-10-CM | POA: Diagnosis not present

## 2018-06-20 DIAGNOSIS — Z9181 History of falling: Secondary | ICD-10-CM | POA: Diagnosis not present

## 2018-06-20 DIAGNOSIS — E039 Hypothyroidism, unspecified: Secondary | ICD-10-CM | POA: Diagnosis not present

## 2018-06-20 DIAGNOSIS — I051 Rheumatic mitral insufficiency: Secondary | ICD-10-CM | POA: Diagnosis not present

## 2018-06-20 DIAGNOSIS — Z7982 Long term (current) use of aspirin: Secondary | ICD-10-CM | POA: Diagnosis not present

## 2018-06-20 DIAGNOSIS — I119 Hypertensive heart disease without heart failure: Secondary | ICD-10-CM | POA: Diagnosis not present

## 2018-06-21 MED FILL — OXYCODONE-ACETAMINOPHEN 5-3: 5-325 | 5 days supply | Qty: 40 | Fill #0

## 2018-06-22 DIAGNOSIS — I119 Hypertensive heart disease without heart failure: Secondary | ICD-10-CM | POA: Diagnosis not present

## 2018-06-22 DIAGNOSIS — Z471 Aftercare following joint replacement surgery: Secondary | ICD-10-CM | POA: Diagnosis not present

## 2018-06-22 DIAGNOSIS — E039 Hypothyroidism, unspecified: Secondary | ICD-10-CM | POA: Diagnosis not present

## 2018-06-22 DIAGNOSIS — I051 Rheumatic mitral insufficiency: Secondary | ICD-10-CM | POA: Diagnosis not present

## 2018-06-22 DIAGNOSIS — M810 Age-related osteoporosis without current pathological fracture: Secondary | ICD-10-CM | POA: Diagnosis not present

## 2018-06-22 DIAGNOSIS — Z7982 Long term (current) use of aspirin: Secondary | ICD-10-CM | POA: Diagnosis not present

## 2018-06-24 DIAGNOSIS — I051 Rheumatic mitral insufficiency: Secondary | ICD-10-CM | POA: Diagnosis not present

## 2018-06-24 DIAGNOSIS — Z471 Aftercare following joint replacement surgery: Secondary | ICD-10-CM | POA: Diagnosis not present

## 2018-06-24 DIAGNOSIS — Z7982 Long term (current) use of aspirin: Secondary | ICD-10-CM | POA: Diagnosis not present

## 2018-06-24 DIAGNOSIS — E039 Hypothyroidism, unspecified: Secondary | ICD-10-CM | POA: Diagnosis not present

## 2018-06-24 DIAGNOSIS — I119 Hypertensive heart disease without heart failure: Secondary | ICD-10-CM | POA: Diagnosis not present

## 2018-06-24 DIAGNOSIS — M810 Age-related osteoporosis without current pathological fracture: Secondary | ICD-10-CM | POA: Diagnosis not present

## 2018-06-26 DIAGNOSIS — M810 Age-related osteoporosis without current pathological fracture: Secondary | ICD-10-CM | POA: Diagnosis not present

## 2018-06-26 DIAGNOSIS — Z7982 Long term (current) use of aspirin: Secondary | ICD-10-CM | POA: Diagnosis not present

## 2018-06-26 DIAGNOSIS — I119 Hypertensive heart disease without heart failure: Secondary | ICD-10-CM | POA: Diagnosis not present

## 2018-06-26 DIAGNOSIS — E039 Hypothyroidism, unspecified: Secondary | ICD-10-CM | POA: Diagnosis not present

## 2018-06-26 DIAGNOSIS — Z471 Aftercare following joint replacement surgery: Secondary | ICD-10-CM | POA: Diagnosis not present

## 2018-06-26 DIAGNOSIS — I051 Rheumatic mitral insufficiency: Secondary | ICD-10-CM | POA: Diagnosis not present

## 2018-06-28 DIAGNOSIS — M1611 Unilateral primary osteoarthritis, right hip: Secondary | ICD-10-CM | POA: Diagnosis not present

## 2018-07-01 DIAGNOSIS — Z7982 Long term (current) use of aspirin: Secondary | ICD-10-CM | POA: Diagnosis not present

## 2018-07-01 DIAGNOSIS — M810 Age-related osteoporosis without current pathological fracture: Secondary | ICD-10-CM | POA: Diagnosis not present

## 2018-07-01 DIAGNOSIS — I051 Rheumatic mitral insufficiency: Secondary | ICD-10-CM | POA: Diagnosis not present

## 2018-07-01 DIAGNOSIS — Z471 Aftercare following joint replacement surgery: Secondary | ICD-10-CM | POA: Diagnosis not present

## 2018-07-01 DIAGNOSIS — I119 Hypertensive heart disease without heart failure: Secondary | ICD-10-CM | POA: Diagnosis not present

## 2018-07-01 DIAGNOSIS — E039 Hypothyroidism, unspecified: Secondary | ICD-10-CM | POA: Diagnosis not present

## 2018-07-01 MED FILL — METHOCARBAMOL 500 MG TABS: 500 | 30 days supply | Qty: 60 | Fill #1

## 2018-07-02 DIAGNOSIS — R262 Difficulty in walking, not elsewhere classified: Secondary | ICD-10-CM | POA: Diagnosis not present

## 2018-07-02 DIAGNOSIS — M25651 Stiffness of right hip, not elsewhere classified: Secondary | ICD-10-CM | POA: Diagnosis not present

## 2018-07-02 DIAGNOSIS — R531 Weakness: Secondary | ICD-10-CM | POA: Diagnosis not present

## 2018-07-02 DIAGNOSIS — M25551 Pain in right hip: Secondary | ICD-10-CM | POA: Diagnosis not present

## 2018-07-05 DIAGNOSIS — R531 Weakness: Secondary | ICD-10-CM | POA: Diagnosis not present

## 2018-07-05 DIAGNOSIS — M25651 Stiffness of right hip, not elsewhere classified: Secondary | ICD-10-CM | POA: Diagnosis not present

## 2018-07-05 DIAGNOSIS — R262 Difficulty in walking, not elsewhere classified: Secondary | ICD-10-CM | POA: Diagnosis not present

## 2018-07-05 DIAGNOSIS — M25551 Pain in right hip: Secondary | ICD-10-CM | POA: Diagnosis not present

## 2018-07-08 DIAGNOSIS — M25651 Stiffness of right hip, not elsewhere classified: Secondary | ICD-10-CM | POA: Diagnosis not present

## 2018-07-08 DIAGNOSIS — R262 Difficulty in walking, not elsewhere classified: Secondary | ICD-10-CM | POA: Diagnosis not present

## 2018-07-08 DIAGNOSIS — M25551 Pain in right hip: Secondary | ICD-10-CM | POA: Diagnosis not present

## 2018-07-08 DIAGNOSIS — R531 Weakness: Secondary | ICD-10-CM | POA: Diagnosis not present

## 2018-07-11 DIAGNOSIS — M25551 Pain in right hip: Secondary | ICD-10-CM | POA: Diagnosis not present

## 2018-07-11 DIAGNOSIS — R262 Difficulty in walking, not elsewhere classified: Secondary | ICD-10-CM | POA: Diagnosis not present

## 2018-07-11 DIAGNOSIS — M25651 Stiffness of right hip, not elsewhere classified: Secondary | ICD-10-CM | POA: Diagnosis not present

## 2018-07-11 DIAGNOSIS — R531 Weakness: Secondary | ICD-10-CM | POA: Diagnosis not present

## 2018-07-12 MED FILL — clonazePAM 1 MG TABS: 1 | 30 days supply | Qty: 60 | Fill #1

## 2018-07-15 MED FILL — HYDROCODON-APAP 5-325: 5-325 | 8 days supply | Qty: 30 | Fill #0

## 2018-07-19 DIAGNOSIS — M1611 Unilateral primary osteoarthritis, right hip: Secondary | ICD-10-CM | POA: Diagnosis not present

## 2018-07-19 DIAGNOSIS — Z96641 Presence of right artificial hip joint: Secondary | ICD-10-CM | POA: Diagnosis not present

## 2018-08-05 MED FILL — HYDROCHLOROTHIAZIDE 12.5 MG: 12.5 | 90 days supply | Qty: 90 | Fill #1

## 2018-08-05 MED FILL — LEVOTHYROXINE 112 MCG TAB: 112 | 90 days supply | Qty: 90 | Fill #1

## 2018-08-05 MED FILL — PANTOPRAZOLE SOD DR 40 MG T: 40 | 30 days supply | Qty: 30 | Fill #5

## 2018-08-08 DIAGNOSIS — E038 Other specified hypothyroidism: Secondary | ICD-10-CM | POA: Diagnosis not present

## 2018-08-08 DIAGNOSIS — I10 Essential (primary) hypertension: Secondary | ICD-10-CM | POA: Diagnosis not present

## 2018-08-08 DIAGNOSIS — E7849 Other hyperlipidemia: Secondary | ICD-10-CM | POA: Diagnosis not present

## 2018-08-08 DIAGNOSIS — M810 Age-related osteoporosis without current pathological fracture: Secondary | ICD-10-CM | POA: Diagnosis not present

## 2018-08-12 MED FILL — clonazePAM 1 MG TABS: 1 | 30 days supply | Qty: 60 | Fill #2

## 2018-08-14 MED FILL — ONDANSETRON ODT 4 MG TABLET: 4 | 7 days supply | Qty: 30 | Fill #1

## 2018-08-15 DIAGNOSIS — I340 Nonrheumatic mitral (valve) insufficiency: Secondary | ICD-10-CM | POA: Diagnosis not present

## 2018-08-15 DIAGNOSIS — Z6827 Body mass index (BMI) 27.0-27.9, adult: Secondary | ICD-10-CM | POA: Diagnosis not present

## 2018-08-15 DIAGNOSIS — Z1389 Encounter for screening for other disorder: Secondary | ICD-10-CM | POA: Diagnosis not present

## 2018-08-15 DIAGNOSIS — I10 Essential (primary) hypertension: Secondary | ICD-10-CM | POA: Diagnosis not present

## 2018-08-15 DIAGNOSIS — M810 Age-related osteoporosis without current pathological fracture: Secondary | ICD-10-CM | POA: Diagnosis not present

## 2018-08-15 DIAGNOSIS — Z23 Encounter for immunization: Secondary | ICD-10-CM | POA: Diagnosis not present

## 2018-08-15 DIAGNOSIS — K760 Fatty (change of) liver, not elsewhere classified: Secondary | ICD-10-CM | POA: Diagnosis not present

## 2018-08-15 DIAGNOSIS — E038 Other specified hypothyroidism: Secondary | ICD-10-CM | POA: Diagnosis not present

## 2018-08-15 DIAGNOSIS — Z0000 Encounter for general adult medical examination without abnormal findings: Secondary | ICD-10-CM | POA: Diagnosis not present

## 2018-08-15 DIAGNOSIS — E7849 Other hyperlipidemia: Secondary | ICD-10-CM | POA: Diagnosis not present

## 2018-08-15 DIAGNOSIS — Z800 Family history of malignant neoplasm of digestive organs: Secondary | ICD-10-CM | POA: Diagnosis not present

## 2018-08-15 DIAGNOSIS — R740 Nonspecific elevation of levels of transaminase and lactic acid dehydrogenase [LDH]: Secondary | ICD-10-CM | POA: Diagnosis not present

## 2018-08-16 DIAGNOSIS — Z1212 Encounter for screening for malignant neoplasm of rectum: Secondary | ICD-10-CM | POA: Diagnosis not present

## 2018-08-21 MED FILL — DICLOFENAC SODIUM 1 % GEL: 1 | 25 days supply | Qty: 200 | Fill #1

## 2018-08-30 MED FILL — METHOCARBAMOL 500 MG TABS: 500 | 30 days supply | Qty: 60 | Fill #0

## 2018-09-02 DIAGNOSIS — M9901 Segmental and somatic dysfunction of cervical region: Secondary | ICD-10-CM | POA: Diagnosis not present

## 2018-09-02 DIAGNOSIS — M50322 Other cervical disc degeneration at C5-C6 level: Secondary | ICD-10-CM | POA: Diagnosis not present

## 2018-09-05 DIAGNOSIS — M50322 Other cervical disc degeneration at C5-C6 level: Secondary | ICD-10-CM | POA: Diagnosis not present

## 2018-09-05 DIAGNOSIS — M9901 Segmental and somatic dysfunction of cervical region: Secondary | ICD-10-CM | POA: Diagnosis not present

## 2018-09-11 DIAGNOSIS — I10 Essential (primary) hypertension: Secondary | ICD-10-CM | POA: Diagnosis not present

## 2018-09-11 DIAGNOSIS — M810 Age-related osteoporosis without current pathological fracture: Secondary | ICD-10-CM | POA: Diagnosis not present

## 2018-09-11 DIAGNOSIS — E7849 Other hyperlipidemia: Secondary | ICD-10-CM | POA: Diagnosis not present

## 2018-09-11 DIAGNOSIS — Z6827 Body mass index (BMI) 27.0-27.9, adult: Secondary | ICD-10-CM | POA: Diagnosis not present

## 2018-09-12 MED FILL — clonazePAM 1 MG TABS: 1 | 30 days supply | Qty: 60 | Fill #3

## 2018-09-13 DIAGNOSIS — M1611 Unilateral primary osteoarthritis, right hip: Secondary | ICD-10-CM | POA: Diagnosis not present

## 2018-09-13 DIAGNOSIS — Z96641 Presence of right artificial hip joint: Secondary | ICD-10-CM | POA: Diagnosis not present

## 2018-09-16 MED FILL — PANTOPRAZOLE SOD DR 40 MG T: 40 | 30 days supply | Qty: 30 | Fill #0

## 2018-10-03 ENCOUNTER — Other Ambulatory Visit (HOSPITAL_COMMUNITY): Payer: Self-pay | Admitting: *Deleted

## 2018-10-04 ENCOUNTER — Ambulatory Visit (HOSPITAL_COMMUNITY)
Admission: RE | Admit: 2018-10-04 | Discharge: 2018-10-04 | Disposition: A | Discharge status: Home / Self Care | Payer: Medicare Other | Source: Ambulatory Visit | Attending: Internal Medicine | Admitting: Internal Medicine

## 2018-10-04 DIAGNOSIS — M810 Age-related osteoporosis without current pathological fracture: Secondary | ICD-10-CM | POA: Diagnosis not present

## 2018-10-04 MED ORDER — DENOSUMAB 60 MG/ML ~~LOC~~ SOSY
60.0000 mg | PREFILLED_SYRINGE | Freq: Once | SUBCUTANEOUS | Status: AC
  Administered 2018-10-04: 60 mg via SUBCUTANEOUS

## 2018-10-04 MED ORDER — DENOSUMAB 60 MG/ML ~~LOC~~ SOSY
PREFILLED_SYRINGE | SUBCUTANEOUS | Status: AC
  Administered 2018-10-04: 60 mg via SUBCUTANEOUS
  Filled 2018-10-04: qty 1

## 2018-10-14 MED FILL — clonazePAM 1 MG TABS: 1 | 30 days supply | Qty: 60 | Fill #0

## 2018-10-14 MED FILL — METHOCARBAMOL 500 MG TABS: 500 | 30 days supply | Qty: 60 | Fill #1

## 2018-11-08 DIAGNOSIS — J029 Acute pharyngitis, unspecified: Secondary | ICD-10-CM | POA: Diagnosis not present

## 2018-11-08 DIAGNOSIS — R05 Cough: Secondary | ICD-10-CM | POA: Diagnosis not present

## 2018-11-08 DIAGNOSIS — J069 Acute upper respiratory infection, unspecified: Secondary | ICD-10-CM | POA: Diagnosis not present

## 2018-11-08 DIAGNOSIS — R112 Nausea with vomiting, unspecified: Secondary | ICD-10-CM | POA: Diagnosis not present

## 2018-11-08 MED FILL — LEVOTHYROXINE 112 MCG TAB: 112 | 90 days supply | Qty: 90 | Fill #0 | Status: TO

## 2018-11-11 MED FILL — HYDROCHLOROTHIAZIDE 12.5 MG: 12.5 | 90 days supply | Qty: 90 | Fill #0 | Status: TO

## 2018-11-11 MED FILL — clonazePAM 1 MG TABS: 1 | 30 days supply | Qty: 60 | Fill #1 | Status: TO

## 2018-11-14 MED FILL — METHOCARBAMOL 500 MG TABS: 500 | 30 days supply | Qty: 60 | Fill #0 | Status: TO

## 2018-11-15 MED FILL — IBUPROFEN 600 MG TABLET: 600 | 90 days supply | Qty: 90 | Fill #0

## 2018-12-09 DIAGNOSIS — I10 Essential (primary) hypertension: Secondary | ICD-10-CM | POA: Diagnosis not present

## 2018-12-09 DIAGNOSIS — E785 Hyperlipidemia, unspecified: Secondary | ICD-10-CM | POA: Diagnosis not present

## 2018-12-11 MED FILL — clonazePAM 1 MG TABS: 1 | 30 days supply | Qty: 60 | Fill #0

## 2018-12-17 MED FILL — METHOCARBAMOL 500 MG TABLET: 500 | 30 days supply | Qty: 60 | Fill #0

## 2019-01-09 MED FILL — clonazePAM 1 MG TABS: 1 | 30 days supply | Qty: 60 | Fill #1

## 2019-01-21 MED FILL — METHOCARBAMOL 500 MG TABLET: 500 | 30 days supply | Qty: 60 | Fill #1

## 2019-01-28 MED FILL — LEVOTHYROXINE 112 MCG TAB: 112 | 90 days supply | Qty: 90 | Fill #0

## 2019-01-31 MED FILL — HYDROCHLOROTHIAZIDE 12.5 MG: 12.5 | 90 days supply | Qty: 90 | Fill #0

## 2019-02-12 MED FILL — clonazePAM 1 MG TABS: 1 | 30 days supply | Qty: 60 | Fill #0

## 2019-02-18 MED FILL — IBUPROFEN 600 MG TABLET: 600 | 90 days supply | Qty: 90 | Fill #1

## 2019-02-21 MED FILL — METHOCARBAMOL 500 MG TABS: 500 | 30 days supply | Qty: 60 | Fill #0

## 2019-02-27 MED FILL — ONDANSETRON ODT 4 MG TABLET: 4 | 7 days supply | Qty: 30 | Fill #0

## 2019-03-09 ENCOUNTER — Observation Stay (HOSPITAL_COMMUNITY): Payer: Medicare Other

## 2019-03-09 ENCOUNTER — Observation Stay (HOSPITAL_COMMUNITY)
Admission: EM | Admit: 2019-03-09 | Discharge: 2019-03-11 | Disposition: A | Discharge status: Home / Self Care | Payer: Medicare Other | Attending: Internal Medicine | Admitting: Internal Medicine

## 2019-03-09 ENCOUNTER — Encounter (HOSPITAL_COMMUNITY): Payer: Self-pay | Admitting: Emergency Medicine

## 2019-03-09 ENCOUNTER — Other Ambulatory Visit: Payer: Self-pay

## 2019-03-09 DIAGNOSIS — K219 Gastro-esophageal reflux disease without esophagitis: Secondary | ICD-10-CM | POA: Insufficient documentation

## 2019-03-09 DIAGNOSIS — K760 Fatty (change of) liver, not elsewhere classified: Secondary | ICD-10-CM | POA: Diagnosis not present

## 2019-03-09 DIAGNOSIS — Z79890 Hormone replacement therapy (postmenopausal): Secondary | ICD-10-CM | POA: Insufficient documentation

## 2019-03-09 DIAGNOSIS — Z79899 Other long term (current) drug therapy: Secondary | ICD-10-CM | POA: Insufficient documentation

## 2019-03-09 DIAGNOSIS — K2951 Unspecified chronic gastritis with bleeding: Secondary | ICD-10-CM | POA: Insufficient documentation

## 2019-03-09 DIAGNOSIS — K2991 Gastroduodenitis, unspecified, with bleeding: Secondary | ICD-10-CM | POA: Insufficient documentation

## 2019-03-09 DIAGNOSIS — R7989 Other specified abnormal findings of blood chemistry: Secondary | ICD-10-CM | POA: Insufficient documentation

## 2019-03-09 DIAGNOSIS — N179 Acute kidney failure, unspecified: Secondary | ICD-10-CM | POA: Diagnosis not present

## 2019-03-09 DIAGNOSIS — R634 Abnormal weight loss: Secondary | ICD-10-CM | POA: Diagnosis not present

## 2019-03-09 DIAGNOSIS — R945 Abnormal results of liver function studies: Secondary | ICD-10-CM

## 2019-03-09 DIAGNOSIS — M549 Dorsalgia, unspecified: Secondary | ICD-10-CM | POA: Diagnosis not present

## 2019-03-09 DIAGNOSIS — Z800 Family history of malignant neoplasm of digestive organs: Secondary | ICD-10-CM | POA: Insufficient documentation

## 2019-03-09 DIAGNOSIS — R110 Nausea: Secondary | ICD-10-CM | POA: Diagnosis not present

## 2019-03-09 DIAGNOSIS — Z7982 Long term (current) use of aspirin: Secondary | ICD-10-CM | POA: Insufficient documentation

## 2019-03-09 DIAGNOSIS — Z96641 Presence of right artificial hip joint: Secondary | ICD-10-CM | POA: Diagnosis not present

## 2019-03-09 DIAGNOSIS — G8929 Other chronic pain: Secondary | ICD-10-CM | POA: Diagnosis not present

## 2019-03-09 DIAGNOSIS — E039 Hypothyroidism, unspecified: Secondary | ICD-10-CM | POA: Diagnosis not present

## 2019-03-09 DIAGNOSIS — M25551 Pain in right hip: Secondary | ICD-10-CM | POA: Insufficient documentation

## 2019-03-09 DIAGNOSIS — I119 Hypertensive heart disease without heart failure: Secondary | ICD-10-CM | POA: Diagnosis not present

## 2019-03-09 DIAGNOSIS — Z791 Long term (current) use of non-steroidal anti-inflammatories (NSAID): Secondary | ICD-10-CM | POA: Insufficient documentation

## 2019-03-09 DIAGNOSIS — K920 Hematemesis: Secondary | ICD-10-CM | POA: Diagnosis not present

## 2019-03-09 DIAGNOSIS — K2970 Gastritis, unspecified, without bleeding: Secondary | ICD-10-CM | POA: Diagnosis present

## 2019-03-09 DIAGNOSIS — K2931 Chronic superficial gastritis with bleeding: Secondary | ICD-10-CM | POA: Diagnosis present

## 2019-03-09 DIAGNOSIS — Z20828 Contact with and (suspected) exposure to other viral communicable diseases: Secondary | ICD-10-CM | POA: Insufficient documentation

## 2019-03-09 DIAGNOSIS — I10 Essential (primary) hypertension: Secondary | ICD-10-CM | POA: Diagnosis present

## 2019-03-09 LAB — COMPREHENSIVE METABOLIC PANEL
ALT: 70 U/L — ABNORMAL HIGH (ref 0–44)
AST: 101 U/L — ABNORMAL HIGH (ref 15–41)
Albumin: 4.2 g/dL (ref 3.5–5.0)
Alkaline Phosphatase: 95 U/L (ref 38–126)
Anion gap: 14 (ref 5–15)
BUN: 27 mg/dL — ABNORMAL HIGH (ref 8–23)
CO2: 23 mmol/L (ref 22–32)
Calcium: 9.2 mg/dL (ref 8.9–10.3)
Chloride: 96 mmol/L — ABNORMAL LOW (ref 98–111)
Creatinine, Ser: 1.56 mg/dL — ABNORMAL HIGH (ref 0.44–1.00)
GFR calc Af Amer: 40 mL/min — ABNORMAL LOW (ref >60)
GFR calc non Af Amer: 34 mL/min — ABNORMAL LOW (ref >60)
Glucose, Bld: 122 mg/dL — ABNORMAL HIGH (ref 70–99)
Potassium: 3.9 mmol/L (ref 3.5–5.1)
Sodium: 133 mmol/L — ABNORMAL LOW (ref 135–145)
Total Bilirubin: 0.7 mg/dL (ref 0.3–1.2)
Total Protein: 7.1 g/dL (ref 6.5–8.1)

## 2019-03-09 LAB — ABO/RH: ABO/RH(D): A POS

## 2019-03-09 LAB — PROTIME-INR
INR: 1.1 (ref 0.8–1.2)
Prothrombin Time: 13.9 seconds (ref 11.4–15.2)

## 2019-03-09 LAB — CBC WITH DIFFERENTIAL/PLATELET
Abs Immature Granulocytes: 0.03 10*3/uL (ref 0.00–0.07)
Basophils Absolute: 0.1 10*3/uL (ref 0.0–0.1)
Basophils Relative: 1 %
Eosinophils Absolute: 0 10*3/uL (ref 0.0–0.5)
Eosinophils Relative: 1 %
HCT: 39.3 % (ref 36.0–46.0)
Hemoglobin: 13.3 g/dL (ref 12.0–15.0)
Immature Granulocytes: 1 %
Lymphocytes Relative: 33 %
Lymphs Abs: 2.1 10*3/uL (ref 0.7–4.0)
MCH: 34.6 pg — ABNORMAL HIGH (ref 26.0–34.0)
MCHC: 33.8 g/dL (ref 30.0–36.0)
MCV: 102.3 fL — ABNORMAL HIGH (ref 80.0–100.0)
Monocytes Absolute: 0.5 10*3/uL (ref 0.1–1.0)
Monocytes Relative: 8 %
Neutro Abs: 3.7 10*3/uL (ref 1.7–7.7)
Neutrophils Relative %: 56 %
Platelets: 253 10*3/uL (ref 150–400)
RBC: 3.84 MIL/uL — ABNORMAL LOW (ref 3.87–5.11)
RDW: 13 % (ref 11.5–15.5)
WBC: 6.4 10*3/uL (ref 4.0–10.5)
nRBC: 0 % (ref 0.0–0.2)

## 2019-03-09 LAB — SARS CORONAVIRUS 2 BY RT PCR (HOSPITAL ORDER, PERFORMED IN ~~LOC~~ HOSPITAL LAB): SARS Coronavirus 2: NEGATIVE

## 2019-03-09 LAB — POC OCCULT BLOOD, ED: Fecal Occult Bld: NEGATIVE

## 2019-03-09 LAB — TYPE AND SCREEN
ABO/RH(D): A POS
Antibody Screen: NEGATIVE

## 2019-03-09 LAB — HEMOGLOBIN AND HEMATOCRIT, BLOOD
HCT: 34.9 % — ABNORMAL LOW (ref 36.0–46.0)
Hemoglobin: 11.6 g/dL — ABNORMAL LOW (ref 12.0–15.0)

## 2019-03-09 MED ORDER — ZOLPIDEM TARTRATE 5 MG PO TABS: 5.0000 mg | ORAL_TABLET | Freq: Every evening | ORAL | Status: DC | PRN

## 2019-03-09 MED ORDER — SODIUM CHLORIDE 0.9% FLUSH: 3.0000 mL | Freq: Two times a day (BID) | INTRAVENOUS | Status: DC

## 2019-03-09 MED ORDER — POTASSIUM CHLORIDE IN NACL 20-0.9 MEQ/L-% IV SOLN
INTRAVENOUS | Status: DC
  Administered 2019-03-09 – 2019-03-10 (×2): via INTRAVENOUS
  Filled 2019-03-09 (×3): qty 1000

## 2019-03-09 MED ORDER — POLYETHYLENE GLYCOL 3350 17 G PO PACK: 17.0000 g | PACK | Freq: Every day | ORAL | Status: DC | PRN

## 2019-03-09 MED ORDER — SODIUM CHLORIDE 0.9 % IV SOLN
8.0000 mg/h | INTRAVENOUS | Status: DC
  Administered 2019-03-09 – 2019-03-10 (×2): 8 mg/h via INTRAVENOUS
  Filled 2019-03-09 (×2): qty 80

## 2019-03-09 MED ORDER — SODIUM CHLORIDE 0.9 % IV SOLN
80.0000 mg | Freq: Once | INTRAVENOUS | Status: AC
  Administered 2019-03-09: 80 mg via INTRAVENOUS
  Filled 2019-03-09: qty 80

## 2019-03-09 MED ORDER — CLONAZEPAM 1 MG PO TABS
1.0000 mg | ORAL_TABLET | Freq: Once | ORAL | Status: AC
  Administered 2019-03-09: 1 mg via ORAL
  Filled 2019-03-09: qty 1

## 2019-03-09 MED ORDER — ONDANSETRON HCL 4 MG/2ML IJ SOLN
4.0000 mg | Freq: Once | INTRAMUSCULAR | Status: AC
  Administered 2019-03-09: 4 mg via INTRAVENOUS
  Filled 2019-03-09: qty 2

## 2019-03-09 MED ORDER — SODIUM CHLORIDE 0.9 % IV BOLUS
1000.0000 mL | Freq: Once | INTRAVENOUS | Status: AC
  Administered 2019-03-09: 1000 mL via INTRAVENOUS

## 2019-03-09 MED ORDER — ACETAMINOPHEN 325 MG PO TABS: 650.0000 mg | ORAL_TABLET | Freq: Four times a day (QID) | ORAL | Status: DC | PRN

## 2019-03-09 MED ORDER — OXYCODONE HCL 5 MG PO TABS
5.0000 mg | ORAL_TABLET | ORAL | Status: DC | PRN
  Administered 2019-03-09 – 2019-03-10 (×4): 5 mg via ORAL
  Filled 2019-03-09 (×4): qty 1

## 2019-03-09 MED ORDER — ONDANSETRON HCL 4 MG/2ML IJ SOLN: 4.0000 mg | Freq: Four times a day (QID) | INTRAMUSCULAR | Status: DC | PRN

## 2019-03-09 MED ORDER — ONDANSETRON HCL 4 MG PO TABS: 4.0000 mg | ORAL_TABLET | Freq: Four times a day (QID) | ORAL | Status: DC | PRN

## 2019-03-09 MED ORDER — SODIUM CHLORIDE 0.9 % IV SOLN
INTRAVENOUS | Status: DC
  Administered 2019-03-09: 13:00:00 via INTRAVENOUS

## 2019-03-09 MED ORDER — ACETAMINOPHEN 650 MG RE SUPP: 650.0000 mg | Freq: Four times a day (QID) | RECTAL | Status: DC | PRN

## 2019-03-09 NOTE — H&P (View-Only) (Signed)
[facility] Consult Note   History Brittany Washington MRN # [id number]  Date of Admission: 03/09/2019 Date of Consultation: 03/09/2019 Referring physician: Dr. Lady Deutscher, MD Primary Care Provider: Marton Redwood, MD Primary Gastroenterologist: Dr. 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  She said she had felt somewhat fatigued in the last couple of days but did not think much of it.  This morning she and her husband were taking a walk, and toward the end she felt unwell and went to the bathroom and vomited bright red blood with clots 4-5 times in a short period of time.  That occurred at about 10 or 11 this morning, and there have been no more episodes since then.  She has not had melena or passage of maroon or bright red blood per rectum.  She denies chronic abdominal pain.  On occasion she will get a brief burning epigastric discomfort with certain foods, and will take her Protonix.  It sounds that she does that rarely.  She is on ibuprofen 600 mg daily for right hip pain after prior hip replacement and also chronic back pain after prior surgery.  She denies dysphagia, odynophagia or anorexia.  She has had about a 20 pound weight loss in the last couple of months, but attributes that to eating healthier and walking a lot more since she has been home during pandemic.  She denies fever cough chills sputum production or loss of smell/taste, and her rapid COVID test is negative.  ROS:  Musculoskeletal:   Back and right hip pain   All other systems are negative except as noted above in the HPI  Past Medical History Past Medical History:  Diagnosis Date  . Benign hypertensive heart disease without heart failure   . Heart murmur, systolic    MECHNICAL   . Hypertension    mild, lost weight, no meds now  . Hypothyroidism   . Osteoporosis   . Primary osteoarthritis of right hip 06/18/2018   Last colonoscopy in 2015 with Dr. Henrene Pastor.  Past Surgical History Past Surgical History:  Procedure Laterality Date  . APPENDECTOMY    . BACK SURGERY    . COLONOSCOPY    . FRACTURE SURGERY    . HAMMER TOE SURGERY Left 07/14/2013   Procedure: HAMMER TOE CORRECTION LEFT SECOND TOE, AUSTIN/AKIN LEFT;  Surgeon: [name], DPM;  Location: [place];  Service: Podiatry;  Laterality: Left;  . JOINT REPLACEMENT    . LAPAROSCOPIC APPENDECTOMY N/A 12/29/2016   Procedure: APPENDECTOMY LAPAROSCOPIC;  Surgeon: Excell Seltzer, MD;  [facility];  Service: General;  Laterality: N/A;  . [place]  . LUMBAR DISKECTOMY W/ DISSECTION, LEFT L4 -- L5  02-23-2004  . METATARSAL OSTEOTOMY Left 04/02/2015   Procedure: LEFT FOOT METATARSAL OSTEOTOMY;  Surgeon: [name], MD;  Location: [place] [facility];  Service: Orthopedics;  Laterality: Left;  . METATARSAL OSTEOTOMY Left 08/18/2016   Procedure: METATARSAL OSTEOTOMY SECOND TOE;  Surgeon: [name], MD;  Location: [place];  Service: Orthopedics;  Laterality: Left;  . ORIF LEFT DISTAL RADIAL FX W/ BONE GRAFT  01-14-2009  . POSTERIOR LUMBAR FUSION  05-16-2005   L4 -- L5  . RE-DO LUMBAR DISKECTOMY , L4  - L5  05-03-2004  .  TOTAL HIP ARTHROPLASTY Right 06/18/2018  . TOTAL HIP ARTHROPLASTY Right 06/18/2018   Procedure: RIGHT TOTAL HIP ARTHROPLASTY ANTERIOR APPROACH;  Surgeon: [name], MD;  Location: [place];  Service: Orthopedics;  Laterality: Right;  . TRIGGER FINGER RELEASE  01/09/2012   Procedure: RELEASE TRIGGER FINGER/A-1 PULLEY;  Surgeon: [name], MD;  Location: [place];  Service: Orthopedics;  Laterality: Left;  Marland Kitchen VAGINAL HYSTERECTOMY  1993   W/ BILATERAL SALPINGOOPHORECTOMY  . WRIST FRACTURE  SURGERY Left     Family History Family History  Problem Relation Age of Onset  . Heart attack Father   . Colon cancer Father   . Heart attack Brother   . Ovarian cancer Mother   . Rectal cancer Neg Hx   . Stomach cancer Neg Hx   . Breast cancer Neg Hx     Social History Social History   Socioeconomic History  . Marital status: Married    Spouse name: Not on file  . Number of children: 1  . Years of education: 31  . Highest education level: Not on file  Occupational History  . Occupation: Therapist, sports  Social Needs  . Financial resource strain: Not on file  . Food insecurity    Worry: Not on file    Inability: Not on file  . Transportation needs    Medical: Not on file    Non-medical: Not on file  Tobacco Use  . Smoking status: Never Smoker  . Smokeless tobacco: Never Used  Substance and Sexual Activity  . Alcohol use: Yes    Alcohol/week: 7.0 standard drinks    Types: 7 Glasses of wine per week    Comment: 1 WINE DAILY  . Drug use: No  . Sexual activity: Yes  Lifestyle  . Physical activity    Days per week: Not on file    Minutes per session: Not on file  . Stress: Not on file  Relationships  . Social Herbalist on phone: Not on file    Gets together: Not on file    Attends religious service: Not on file    Active member of club or organization: Not on file    Attends meetings of clubs or organizations: Not on file    Relationship status: Not on file  Other Topics Concern  . Not on file  Social History Narrative   Patient drinks about 2 cups of caffeine daily.   Patient is right handed.    She reports 2 glasses of wine nightly.  Allergies Allergies  Allergen Reactions  . Sulfa Antibiotics Diarrhea and Other (See Comments)    Body aches     Outpatient Meds Home medications from the H+P and/or nursing med reconciliation reviewed.  Ibuprofen 600 mg daily, Klonopin nightly for sleep, Synthroid daily, Protonix on occasion for  dyspepsia.  _____________________________________________________________________ Objective   Exam:  Current vital signs  Patient Vitals for the past 8 hrs:  BP Temp Temp src Pulse Resp SpO2 Height Weight  03/09/19 1415 126/75 - - 77 - 98 % - -  03/09/19 1400 122/73 - - 74 - 99 % - -  03/09/19 1245 129/73 - - 78 - 98 % - -  03/09/19 1230 134/73 - - 79 - 96 % - -  03/09/19 1204 - - - - - - 5\' 4"  (1.626 m) 67.6 kg  03/09/19 1203 (!) 158/86 - - 77 (!) 6 99 % - -  03/09/19 1200 (!) 158/86 - -  80 - 97 % - -  03/09/19 1155 122/76 (!) 97.4 F (36.3 C) Oral 88 16 99 % - -   No intake or output data in the 24 hours ending 03/09/19 1527  Physical Exam:    General: this is a well-appearing female patient in no acute distress  Eyes: sclera anicteric, no redness  ENT: oral mucosa moist without lesions, no cervical or supraclavicular lymphadenopathy.  Oropharynx not visualized, patient wearing a mask  CV: RRR with soft systolic murmur, S9/Q3, no JVD,, no peripheral edema  Resp: clear to auscultation bilaterally, normal RR and effort noted  GI: soft, no tenderness, with active bowel sounds. No guarding or palpable organomegaly noted  Skin; warm and dry, no rash or jaundice noted  Neuro: awake, alert and oriented x 3. Normal gross motor function and fluent speech.  Labs:  CBC Latest Ref Rng & Units 03/09/2019 06/06/2018 12/31/2016  WBC 4.0 - 10.5 K/uL 6.4 6.6 10.8(H)  Hemoglobin 12.0 - 15.0 g/dL 13.3 14.0 11.4(L)  Hematocrit 36.0 - 46.0 % 39.3 43.6 34.5(L)  Platelets 150 - 400 K/uL 253 214 151  MCV elevated chronically  CMP Latest Ref Rng & Units 03/09/2019 06/06/2018 12/30/2016  Glucose 70 - 99 mg/dL 122(H) 95 202(H)  BUN 8 - 23 mg/dL 27(H) 22 9  Creatinine 0.44 - 1.00 mg/dL 1.56(H) 1.01(H) 0.80  Sodium 135 - 145 mmol/L 133(L) 138 136  Potassium 3.5 - 5.1 mmol/L 3.9 4.1 3.7  Chloride 98 - 111 mmol/L 96(L) 104 106  CO2 22 - 32 mmol/L 23 24 23   Calcium 8.9 - 10.3 mg/dL 9.2 9.5  8.6(L)  Total Protein 6.5 - 8.1 g/dL 7.1 - -  Total Bilirubin 0.3 - 1.2 mg/dL 0.7 - -  Alkaline Phos 38 - 126 U/L 95 - -  AST 15 - 41 U/L 101(H) - -  ALT 0 - 44 U/L 70(H) - -    Recent Labs  Lab 03/09/19 1221  INR 1.1   Unknown if previous hepatitis virus testing  Radiologic studies:  None  @ASSESSMENTPLANBEGIN @ Impression:  Hematemesis  There was blood upon the first episode of vomiting, rather than after retching for a period of time to suggest Mallory-Weiss tear.  Chronic ibuprofen use raises suspicion of peptic ulcer.  Fortunately, she has had no further overt bleeding and admission hemoglobin normal.  Her vital signs are normal and she looks comfortable.  Elevated LFTs,?  Alcohol related, given her reported use and pattern of elevation along with elevated MCV. After the bleeding issue was attended to, this can be addressed as an outpatient to see if it is ever been worked up before she is with ultrasound or viral hepatitis panel.  Plan:  Admit to the internal medicine hospital service  2 peripheral IVs have been placed  She has received Protonix 80 mg bolus and is on an 8 mg/h infusion.  We will obtain a hemoglobin and hematocrit later this evening and a CBC tomorrow morning.  Upper endoscopy tomorrow.  This would be with my partner Dr. Oretha Caprice, who will manage the consult service starting tomorrow.  She is agreeable after discussion of risks and benefits.  The benefits and risks of the planned procedure were described in detail with the patient or (when appropriate) their health care proxy.  Risks were outlined as including, but not limited to, bleeding, infection, perforation, adverse medication reaction leading to cardiac or pulmonary decompensation.  The limitation of incomplete mucosal visualization was also discussed.  No guarantees or  warranties were given.     If necessary, Dr. Silvano Rusk is on call overnight in case this patient should develop brisk  rebleeding requiring urgent attention. Only ice chips and water for now, then NPO after MN.   I updated her husband by phone, and will give signout to my partners on her.  Thank you for the courtesy of this consult.  Please contact me with any questions or concerns.  Nelida Meuse III Office: [phone]

## 2019-03-09 NOTE — H&P (Signed)
History and Physical    Brittany Washington:[id number] DOB: December 30, 1950 DOA: 03/09/2019  PCP: Marton Redwood, MD  Patient coming from: [place] store  I have personally briefly reviewed patient's old medical records in [place]  Chief Complaint: 68 year old female with 5 episodes of emesis at the grocery store today which was bloody.  HPI: Brittany Washington is a 68 y.o. female with medical history significant of hypertension, hypothyroidism, osteoarthritis, and systolic heart murmur who presents to the emergency department today after having 5 episodes of bloody emesis with clots at the grocery store today.  She states she was in the grocery store with her husband felt a little nauseous felt like she was going to vomit went to the bathroom and vomited 5 times with blood.  Little lightheaded with this episode.  Has not had anything similar to this occur before.  Had a colonoscopy 5 years ago that was negative.  She has never had an upper endoscopy.  Endorses a 20 pound weight loss over the past 2 months due mostly to the point where she has retired from working as a Marine scientist and is now eating healthier and walking every day.  She reports that her stool is been brown and not dark in color.  She had some diarrhea this morning but no other symptoms.  She is now feeling fine and does not feel nauseous anymore.  Denies abdominal pain, history of hepatitis, history of prior symptoms, history of alcohol use, prior history of cirrhosis.  Her only other complaint is that she has bilateral lower extremity cramping which she does not know what it is coming from.  ED Course: Hemoglobin stable, no emesis in the emergency department.  Discussed with Dr. Loletha Carrow from [facility] who recommends endoscopy in the a.m.  LFTs noted to be elevated.  Noted.  Review of Systems: As per HPI otherwise all other systems reviewed and  negative.   Past Medical History:  Diagnosis Date  . Benign hypertensive heart disease  without heart failure   . Heart murmur, systolic    MECHNICAL  . Hypertension    mild, lost weight, no meds now  . Hypothyroidism   . Osteoporosis   . Primary osteoarthritis of right hip 06/18/2018    Past Surgical History:  Procedure Laterality Date  . APPENDECTOMY    . BACK SURGERY    . COLONOSCOPY    . FRACTURE SURGERY    . HAMMER TOE SURGERY Left 07/14/2013   Procedure: HAMMER TOE CORRECTION LEFT SECOND TOE, AUSTIN/AKIN LEFT;  Surgeon: [name], DPM;  Location: [place];  Service: Podiatry;  Laterality: Left;  . JOINT REPLACEMENT    . LAPAROSCOPIC APPENDECTOMY N/A 12/29/2016   Procedure: APPENDECTOMY LAPAROSCOPIC;  Surgeon: Excell Seltzer, MD;  [facility];  Service: General;  Laterality: N/A;  . [place]  . LUMBAR DISKECTOMY W/ DISSECTION, LEFT L4 -- L5  02-23-2004  . METATARSAL OSTEOTOMY Left 04/02/2015   Procedure: LEFT FOOT METATARSAL OSTEOTOMY;  Surgeon: [name], MD;  Location: [place];  Service: Orthopedics;  Laterality: Left;  . METATARSAL OSTEOTOMY Left 08/18/2016   Procedure: METATARSAL OSTEOTOMY SECOND TOE;  Surgeon: [name], MD;  Location: [place];  Service: Orthopedics;  Laterality: Left;  . ORIF LEFT DISTAL RADIAL FX W/ BONE GRAFT  01-14-2009  . POSTERIOR LUMBAR FUSION  05-16-2005   L4 -- L5  . RE-DO LUMBAR DISKECTOMY , L4  - L5  05-03-2004  .  TOTAL HIP ARTHROPLASTY Right 06/18/2018  . TOTAL HIP ARTHROPLASTY Right 06/18/2018   Procedure: RIGHT TOTAL HIP ARTHROPLASTY ANTERIOR APPROACH;  Surgeon: [name], MD;  Location: [place];  Service: Orthopedics;  Laterality: Right;  . TRIGGER FINGER RELEASE  01/09/2012   Procedure: RELEASE TRIGGER FINGER/A-1 PULLEY;  Surgeon: [name], MD;  Location: [place];  Service: Orthopedics;  Laterality: Left;  Marland Kitchen VAGINAL HYSTERECTOMY  1993   W/ BILATERAL SALPINGOOPHORECTOMY  . WRIST FRACTURE  SURGERY Left     Social History   Social History Narrative   Patient drinks about 2 cups of caffeine daily.   Patient is right handed.      reports that she has never smoked. She has never used smokeless tobacco. She reports current alcohol use of about 7.0 standard drinks of alcohol per week. She reports that she does not use drugs.  Allergies  Allergen Reactions  . Sulfa Antibiotics Diarrhea and Other (See Comments)    Body aches     Family History  Problem Relation Age of Onset  . Heart attack Father   . Colon cancer Father   . Heart attack Brother   . Ovarian cancer Mother   . Rectal cancer Neg Hx   . Stomach cancer Neg Hx   . Breast cancer Neg Hx     Prior to Admission medications   Medication Sig Start Date End Date Taking? Authorizing Provider  aspirin EC 325 MG EC tablet Take 1 tablet (325 mg total) by mouth 2 (two) times daily after a meal. 06/19/18   Loni Dolly, PA-C  cetirizine (ZYRTEC) 10 MG tablet Take 10 mg by mouth at bedtime.     [provider]  Cholecalciferol (VITAMIN D3) 400 units CAPS Take 400 Units by mouth daily.    [provider]  clonazePAM (KLONOPIN) 1 MG tablet Take 1 tablet (1 mg total) by mouth at bedtime. Take 1-2 tablets by mouth daily at bedtime as needed for sleep. Patient taking differently: Take 1-2 mg by mouth at bedtime as needed (for sleep).  12/20/15   Nahser, Wonda Cheng, MD  cyclobenzaprine (FLEXERIL) 10 MG tablet Take 10 mg by mouth at bedtime as needed for muscle spasms.    [provider]  hydrochlorothiazide (MICROZIDE) 12.5 MG capsule TAKE 1 CAPSULE BY MOUTH DAILY. Patient taking differently: Take 12.5 mg by mouth daily.  09/21/15   Darlin Coco, MD  HYDROcodone-acetaminophen (NORCO/VICODIN) 5-325 MG tablet Take 1-2 tablets by mouth every 6 (six) hours as needed (pain). 06/19/18   Loni Dolly, PA-C  ketotifen (ZADITOR) 0.025 % ophthalmic solution Place 1 drop into both eyes daily.    [provider]  levothyroxine (SYNTHROID, LEVOTHROID) 112 MCG tablet TAKE 1 TABLET BY MOUTH ONCE DAILY Patient taking differently: Take 112 mcg by mouth at bedtime.  10/19/14   Lendon Colonel, NP  pantoprazole (PROTONIX) 40 MG tablet Take 40 mg by mouth daily as needed (for acid reflux).     [provider]  tiZANidine (ZANAFLEX) 4 MG tablet Take 1 tablet (4 mg total) by mouth every 6 (six) hours as needed. 06/19/18 06/19/19  Loni Dolly, PA-C    Physical Exam:  Constitutional: NAD, calm, comfortable Vitals:   03/09/19 1230 03/09/19 1245 03/09/19 1400 03/09/19 1415  BP: 134/73 129/73 122/73 126/75  Pulse: 79 78 74 77  Resp:      Temp:      TempSrc:      SpO2: 96% 98% 99% 98%  Weight:      Height:       Eyes: PERRL, lids and conjunctivae normal ENMT: Mucous membranes are dry. Posterior pharynx clear of any exudate or lesions.Normal dentition.  Neck: normal, supple, no masses, no thyromegaly Respiratory: clear to auscultation bilaterally, no wheezing, no crackles. Normal respiratory effort. No accessory muscle use.  Cardiovascular: Regular rate and rhythm, no murmurs / rubs / gallops. No extremity edema. 2+ pedal pulses. No carotid bruits.  Abdomen: no tenderness, no masses palpated. No hepatosplenomegaly. Bowel sounds positive.  Musculoskeletal: no clubbing / cyanosis. No joint deformity upper and lower extremities. Good ROM, no contractures. Normal muscle tone.  Skin: no rashes, lesions, ulcers. No induration Neurologic: CN 2-12 grossly intact. Sensation intact, DTR normal. Strength 5/5 in all 4.  Psychiatric: Normal judgment and insight. Alert and oriented x 3. Normal mood.    Labs on Admission: I have personally reviewed following labs and imaging studies  CBC: Recent Labs  Lab 03/09/19 1221  WBC 6.4  NEUTROABS 3.7  HGB 13.3  HCT 39.3  MCV 102.3*  PLT 989   Basic Metabolic Panel: Recent Labs  Lab 03/09/19 1221  NA 133*  K 3.9  CL 96*  CO2 23   GLUCOSE 122*  BUN 27*  CREATININE 1.56*  CALCIUM 9.2   GFR: Estimated Creatinine Clearance: 33.5 mL/min (A) (by C-G formula based on SCr of 1.56 mg/dL (H)). Liver Function Tests: Recent Labs  Lab 03/09/19 1221  AST 101*  ALT 70*  ALKPHOS 95  BILITOT 0.7  PROT 7.1  ALBUMIN 4.2   Coagulation Profile: Recent Labs  Lab 03/09/19 1221  INR 1.1   Urine analysis:    Component Value Date/Time   COLORURINE YELLOW 06/18/2018 1157   APPEARANCEUR HAZY (A) 06/18/2018 1157   LABSPEC <1.005 (L) 06/18/2018 1157   PHURINE 5.0 06/18/2018 1157   GLUCOSEU NEGATIVE 06/18/2018 1157   HGBUR LARGE (A) 06/18/2018 1157   BILIRUBINUR NEGATIVE 06/18/2018 1157   KETONESUR NEGATIVE 06/18/2018 1157   PROTEINUR NEGATIVE 06/18/2018 1157   UROBILINOGEN 0.2 06/25/2015 0903   NITRITE NEGATIVE 06/18/2018 1157   LEUKOCYTESUR SMALL (A) 06/18/2018 1157    Radiological Exams on Admission: No results found.   Assessment/Plan Principal Problem:   Hematemesis Active Problems:   AKI (acute kidney injury) ([place])   Primary hypertension   Abnormal LFTs (liver function tests)   Hypothyroid   Weight loss    1.  Hematemesis: This is a new problem for the patient she has never had this happen before.  She has no underlying liver or GI disorders.  Have some diarrhea this morning and felt lightheaded over the past few days.  Denies any dark stools.  Discussed with Dr. Loletha Carrow from GI who plans endoscopy in a.m.  Given IV Protonix in the emergency department and a drip has been started.  2.  Acute kidney injury: Patient started on normal saline at 125/h in the emergency department.  We will hydrate and monitor response overnight.  Avoid nephrotoxic agents.  3.  Primary hypertension:  Blood pressures currently stable.  We will continue home blood pressure medications which include to new hydrochlorothiazide 12.5 mg p.o. daily  4.  Abnormal LFTs: We will check right upper quadrant ultrasound and hepatitis panels.   5.  Hypothyroidism: Continue levothyroxine 112 mcg p.o. daily.  6.  Weight loss: Intentional however concerning given abnormal LFTs.  Check right upper quadrant ultrasound.  Rule out liver pathology.  DVT prophylaxis: SCDs Code Status: Full code Family  Communication: Patient retains capacity on the phone with her family when I came in Disposition Plan: Likely home in 24 to 53 hours Consults called: Dr. Loletha Carrow from GI Admission status: Observation  It is my clinical opinion that referral for OBSERVATION is reasonable and necessary in this patient based on the above information provided. The aforementioned taken together are felt to place the patient at high risk for further clinical deterioration. However it is anticipated that the patient may be medically stable for discharge from the hospital within 24 to 48 hours.   Lady Deutscher MD FACP [facility] Pager [phone]  How to contact the [facility] Attending or Consulting provider [place] or covering provider during after hours [place], for this patient?  1. Check the care team in [facility] and look for a) attending/consulting [facility] provider listed and b) the [facility] team listed 2. Log into [email or web address] and use [place]'s universal password to access. If you do not have the password, please contact the hospital operator. 3. Locate the [facility] provider you are looking for under [facility] and page to a number that you can be directly reached. 4. If you still have difficulty reaching the provider, please page the [facility] (Director on Call) for the Hospitalists listed on amion for assistance.  If 7PM-7AM, please contact night-coverage [email or web address] Password [email or web address]  03/09/2019, 2:59 PM

## 2019-03-09 NOTE — ED Notes (Signed)
Patient transported to Ultrasound 

## 2019-03-09 NOTE — ED Triage Notes (Signed)
Brittany Washington -[phone]

## 2019-03-09 NOTE — ED Provider Notes (Signed)
[facility]ERGENCY DEPARTMENT Provider Note   CSN: [id number] Arrival date & time: 03/09/19  1152    History   Chief Complaint Chief Complaint  Patient presents with   Hematemesis    HPI Brittany Washington is a 68 y.o. female with history of hypertension, osteoporosis, hypothyroidism who presents with hematemesis.  Patient reports she vomited 5 times and there was bright red blood with clots.  She reports being on a walk when this happened.  She began feeling nauseous and vomited.  She has had generalized weakness and lightheadedness.  She reports she had been feeling fine before this, except she did have an episode of fatigue and lightheadedness last weekend.  Patient denies regular alcohol use.  She states she only drinks wine occasionally.  Patient has no history of this.  Reports she takes Protonix before eating spicy foods, but no diagnosed history of GERD or ulcers.  Patient denies any chest pain, shortness of breath, abdominal pain, diarrhea, bloody stools.  Patient reports having a colonoscopy by Dr. Henrene Pastor with low our GI 5 years ago and was negative. She does have family history of colon cancer.     HPI  Past Medical History:  Diagnosis Date   Benign hypertensive heart disease without heart failure    Heart murmur, systolic    MECHNICAL   Hypertension    mild, lost weight, no meds now   Hypothyroidism    Osteoporosis    Primary osteoarthritis of right hip 06/18/2018    Patient Active Problem List   Diagnosis Date Noted   Hematemesis 03/09/2019   Abnormal LFTs (liver function tests) 03/09/2019   AKI (acute kidney injury) ([place]) 03/09/2019   Weight loss 03/09/2019   Primary osteoarthritis of right hip 06/18/2018   Acute appendicitis with perforation and peritoneal abscess 12/29/2016   Mitral regurgitation 01/24/2016   Stroke-like symptoms 06/25/2015   Headache 06/25/2015   Primary hypertension 06/25/2015   Expressive aphasia      Cough 06/25/2014   Multiple respiratory allergies 03/11/2014   Other abnormal blood chemistry 06/13/2011   Benign hypertensive heart disease without heart failure 06/13/2011   Hypothyroid 06/13/2011   Heart murmur 06/13/2011    Past Surgical History:  Procedure Laterality Date   APPENDECTOMY     BACK SURGERY     COLONOSCOPY     FRACTURE SURGERY     HAMMER TOE SURGERY Left 07/14/2013   Procedure: HAMMER TOE CORRECTION LEFT SECOND TOE, AUSTIN/AKIN LEFT;  Surgeon: [name], DPM;  Location: [facility];  Service: Podiatry;  Laterality: Left;   JOINT REPLACEMENT     LAPAROSCOPIC APPENDECTOMY N/A 12/29/2016   Procedure: APPENDECTOMY LAPAROSCOPIC;  Surgeon: Excell Seltzer, MD;  [facility];  Service: General;  Laterality: N/A;   [place] DISKECTOMY W/ DISSECTION, LEFT L4 -- L5  02-23-2004   METATARSAL OSTEOTOMY Left 04/02/2015   Procedure: LEFT FOOT METATARSAL OSTEOTOMY;  Surgeon: [name], MD;  Location: [facility];  Service: Orthopedics;  Laterality: Left;   METATARSAL OSTEOTOMY Left 08/18/2016   Procedure: METATARSAL OSTEOTOMY SECOND TOE;  Surgeon: [name], MD;  Location: [facility];  Service: Orthopedics;  Laterality: Left;   ORIF LEFT DISTAL RADIAL FX W/ BONE GRAFT  01-14-2009   POSTERIOR LUMBAR FUSION  05-16-2005   L4 -- L5   RE-DO LUMBAR DISKECTOMY , L4  - L5  05-03-2004   TOTAL HIP ARTHROPLASTY Right 06/18/2018   TOTAL HIP  ARTHROPLASTY Right 06/18/2018   Procedure: RIGHT TOTAL HIP ARTHROPLASTY ANTERIOR APPROACH;  Surgeon: [name], MD;  Location: [facility];  Service: Orthopedics;  Laterality: Right;   TRIGGER FINGER RELEASE  01/09/2012   Procedure: RELEASE TRIGGER FINGER/A-1 PULLEY;  Surgeon: [name], MD;  Location: [facility];  Service: Orthopedics;  Laterality: Left;   VAGINAL HYSTERECTOMY  1993   W/ BILATERAL  SALPINGOOPHORECTOMY   WRIST FRACTURE SURGERY Left      OB History   No obstetric history on file.      Home Medications    Prior to Admission medications   Medication Sig Start Date End Date Taking? Authorizing Provider  valsartan (DIOVAN) 160 MG tablet Take 160 mg by mouth daily. 09/26/18  Yes [provider]  aspirin EC 325 MG EC tablet Take 1 tablet (325 mg total) by mouth 2 (two) times daily after a meal. 06/19/18   Loni Dolly, PA-C  cetirizine (ZYRTEC) 10 MG tablet Take 10 mg by mouth at bedtime.     [provider]  Cholecalciferol (VITAMIN D3) 400 units CAPS Take 400 Units by mouth daily.    [provider]  clonazePAM (KLONOPIN) 1 MG tablet Take 1 tablet (1 mg total) by mouth at bedtime. Take 1-2 tablets by mouth daily at bedtime as needed for sleep. Patient taking differently: Take 1-2 mg by mouth at bedtime as needed (for sleep).  12/20/15   Nahser, Wonda Cheng, MD  cyclobenzaprine (FLEXERIL) 10 MG tablet Take 10 mg by mouth at bedtime as needed for muscle spasms.    [provider]  hydrochlorothiazide (MICROZIDE) 12.5 MG capsule TAKE 1 CAPSULE BY MOUTH DAILY. Patient taking differently: Take 12.5 mg by mouth daily.  09/21/15   Darlin Coco, MD  ketotifen (ZADITOR) 0.025 % ophthalmic solution Place 1 drop into both eyes daily.    [provider]  levothyroxine (SYNTHROID, LEVOTHROID) 112 MCG tablet TAKE 1 TABLET BY MOUTH ONCE DAILY Patient taking differently: Take 112 mcg by mouth at bedtime.  10/19/14   Lendon Colonel, NP  pantoprazole (PROTONIX) 40 MG tablet Take 40 mg by mouth daily as needed (for acid reflux).     [provider]  tiZANidine (ZANAFLEX) 4 MG tablet Take 1 tablet (4 mg total) by mouth every 6 (six) hours as needed. 06/19/18 06/19/19  Loni Dolly, PA-C    Family History Family History  Problem Relation Age of Onset   Heart attack Father    Colon cancer Father    Heart attack Brother     Ovarian cancer Mother    Rectal cancer Neg Hx    Stomach cancer Neg Hx    Breast cancer Neg Hx     Social History Social History   Tobacco Use   Smoking status: Never Smoker   Smokeless tobacco: Never Used  Substance Use Topics   Alcohol use: Yes    Alcohol/week: 7.0 standard drinks    Types: 7 Glasses of wine per week    Comment: 1 WINE DAILY   Drug use: No     Allergies   Sulfa antibiotics   Review of Systems Review of Systems  Constitutional: Negative for chills and fever.  HENT: Negative for facial swelling and sore throat.   Respiratory: Negative for shortness of breath.   Cardiovascular: Negative for chest pain.  Gastrointestinal: Positive for nausea and vomiting. Negative for abdominal pain, blood in stool and diarrhea.  Genitourinary: Negative for dysuria.  Musculoskeletal: Negative for back  pain.  Skin: Negative for rash and wound.  Neurological: Positive for light-headedness. Negative for headaches.  Psychiatric/Behavioral: The patient is not nervous/anxious.      Physical Exam Updated Vital Signs BP (!) 141/77 (BP Location: Right Arm)    Pulse 92    Temp 98.1 F (36.7 C) (Oral)    Resp (!) 6    Ht 5\' 4"  (1.626 m)    Wt 65.8 kg    SpO2 99%    BMI 24.90 kg/m   Physical Exam Vitals signs and nursing note reviewed.  Constitutional:      General: She is not in acute distress.    Appearance: She is well-developed. She is not diaphoretic.  HENT:     Head: Normocephalic and atraumatic.     Mouth/Throat:     Pharynx: No oropharyngeal exudate.  Eyes:     General: No scleral icterus.       Right eye: No discharge.        Left eye: No discharge.     Conjunctiva/sclera: Conjunctivae normal.     Pupils: Pupils are equal, round, and reactive to light.  Neck:     Musculoskeletal: Normal range of motion and neck supple.     Thyroid: No thyromegaly.  Cardiovascular:     Rate and Rhythm: Normal rate and regular rhythm.     Heart sounds: Normal heart  sounds. No murmur. No friction rub. No gallop.   Pulmonary:     Effort: Pulmonary effort is normal. No respiratory distress.     Breath sounds: Normal breath sounds. No stridor. No wheezing or rales.  Abdominal:     General: Bowel sounds are normal. There is no distension.     Palpations: Abdomen is soft.     Tenderness: There is no abdominal tenderness. There is no guarding or rebound.  Genitourinary:    Rectum: Guaiac result negative.  Lymphadenopathy:     Cervical: No cervical adenopathy.  Skin:    General: Skin is warm and dry.     Coloration: Skin is not pale.     Findings: No rash.  Neurological:     Mental Status: She is alert.     Coordination: Coordination normal.      ED Treatments / Results  Labs (all labs ordered are listed, but only abnormal results are displayed) Labs Reviewed  COMPREHENSIVE METABOLIC PANEL - Abnormal; Notable for the following components:      Result Value   Sodium 133 (*)    Chloride 96 (*)    Glucose, Bld 122 (*)    BUN 27 (*)    Creatinine, Ser 1.56 (*)    AST 101 (*)    ALT 70 (*)    GFR calc non Af Amer 34 (*)    GFR calc Af Amer 40 (*)    All other components within normal limits  CBC WITH DIFFERENTIAL/PLATELET - Abnormal; Notable for the following components:   RBC 3.84 (*)    MCV 102.3 (*)    MCH 34.6 (*)    All other components within normal limits  SARS CORONAVIRUS 2 (HOSPITAL ORDER, [facility])  PROTIME-INR  HEPATITIS PANEL, ACUTE  HIV ANTIBODY (ROUTINE TESTING W REFLEX)  HEMOGLOBIN AND HEMATOCRIT, BLOOD  COMPREHENSIVE METABOLIC PANEL  CBC  POC OCCULT BLOOD, ED  TYPE AND SCREEN  ABO/RH    EKG None  Radiology US Abdomen Limited Ruq  Result Date: 03/09/2019 CLINICAL DATA:  Elevated liver function tests and bloody  emesis. EXAM: ULTRASOUND ABDOMEN LIMITED RIGHT UPPER QUADRANT COMPARISON:  None. FINDINGS: Gallbladder: Surgically absent Common bile duct: Diameter: Normal, 3 mm. Liver:  Moderately increased echogenicity. Portal vein is patent on color Doppler imaging with normal direction of blood flow towards the liver. IMPRESSION: 1. Increased hepatic echogenicity, most likely related to steatosis. 2.  Cholecystectomy, without biliary ductal dilatation. Electronically Signed   By: Abigail Miyamoto M.D.   On: 03/09/2019 15:57    Procedures .Critical Care Performed by: Frederica Kuster, PA-C Authorized by: Frederica Kuster, PA-C   Critical care provider statement:    Critical care time (minutes):  45   Critical care was necessary to treat or prevent imminent or life-threatening deterioration of the following conditions:  Circulatory failure and dehydration (Upper GI bleed)   Critical care was time spent personally by me on the following activities:  Discussions with consultants, evaluation of patient's response to treatment, examination of patient, ordering and performing treatments and interventions, ordering and review of laboratory studies, ordering and review of radiographic studies, pulse oximetry, re-evaluation of patient's condition, obtaining history from patient or surrogate and review of old charts   (including critical care time)  Medications Ordered in ED Medications  pantoprazole (PROTONIX) 80 mg in sodium chloride 0.9 % 250 mL (0.32 mg/mL) infusion (8 mg/hr Intravenous New Bag/Given 03/09/19 1430)  sodium chloride flush (NS) 0.9 % injection 3 mL (has no administration in time range)  0.9 % NaCl with KCl 20 mEq/ L  infusion (has no administration in time range)  acetaminophen (TYLENOL) tablet 650 mg (has no administration in time range)    Or  acetaminophen (TYLENOL) suppository 650 mg (has no administration in time range)  oxyCODONE (Oxy IR/ROXICODONE) immediate release tablet 5 mg (has no administration in time range)  zolpidem (AMBIEN) tablet 5 mg (has no administration in time range)  polyethylene glycol (MIRALAX / GLYCOLAX) packet 17 g (has no administration in  time range)  ondansetron (ZOFRAN) tablet 4 mg (has no administration in time range)    Or  ondansetron (ZOFRAN) injection 4 mg (has no administration in time range)  ondansetron (ZOFRAN) injection 4 mg (4 mg Intravenous Given 03/09/19 1253)  pantoprazole (PROTONIX) 80 mg in sodium chloride 0.9 % 100 mL IVPB (80 mg Intravenous New Bag/Given 03/09/19 1335)  sodium chloride 0.9 % bolus 1,000 mL (1,000 mLs Intravenous New Bag/Given 03/09/19 1437)     Initial Impression / Assessment and Plan / ED Course  I have reviewed the triage vital signs and the nursing notes.  Pertinent labs & imaging results that were available during my care of the patient were reviewed by me and considered in my medical decision making (see chart for details).        Patient presenting with hematemesis.  She has had no episodes in the ED.  Her hemoglobin is initially stable at 13.3.  CMP shows sodium 133, chloride 96, BUN 27, creatinine 1.56, AST 101, ALT 70.  Fecal occult is negative.  Protonix bolus and drip initiated as well as Zofran and fluids.  I discussed patient case with Dr. Loletha Carrow with [facility] who advised admission to medicine.  He plans to do endoscopy tomorrow unless patient worsens acutely.  He confirms plan for Protonix.  I discussed patient case with Dr. Evangeline Gula with [facility] who accepts patient for admission.  I appreciate the above consultants for their assistance with the patient.  Patient case discussed with my attending, Dr. Sherry Ruffing, who guided the patient's management and agrees  with plan.  Final Clinical Impressions(s) / ED Diagnoses   Final diagnoses:  Hematemesis with nausea  Elevated LFTs  Bloody emesis    ED Discharge Orders    None       Frederica Kuster, PA-C 03/09/19 1726    Tegeler, Gwenyth Allegra, MD 03/09/19 1901

## 2019-03-09 NOTE — ED Notes (Signed)
ED TO INPATIENT HANDOFF REPORT  ED Nurse Name and Phone #: Harriette Bouillon [phone]  S Name/Age/Gender Brittany Washington 68 y.o. female Room/Bed: 025C/025C  Code Status   Code Status: Prior  Home/SNF/Other Home Patient oriented to: self, place, time and situation Is this baseline? Yes   Triage Complete: Triage complete  Chief Complaint vomiting blood  Triage Note Husband , Yvone Neu -[phone]  Patient states she has been having periods of weakness for several days. Started feeling nauseated today an had 5 episodes of vomiting bright red blood with clots. Denies pain.   Allergies Allergies  Allergen Reactions  . Sulfa Antibiotics Diarrhea and Other (See Comments)    Body aches     Level of Care/Admitting Diagnosis ED Disposition    ED Disposition Condition [facility]: [facility] [[id number]]  Level of Care: Med-Surg [16]  I expect the patient will be discharged within 24 hours: Yes  LOW acuity---Tx typically complete <24 hrs---ACUTE conditions typically can be evaluated <24 hours---LABS likely to return to acceptable levels <24 hours---IS near functional baseline---EXPECTED to return to current living arrangement---NOT newly hypoxic: Meets criteria for 5C-Observation unit  Covid Evaluation: Confirmed COVID Negative  Diagnosis: Hematemesis [578.0.ICD-9-CM]  Admitting Physician: Lady Deutscher [[id number]]  Attending Physician: Lady Deutscher [[id number]]  PT Class (Do Not Modify): Observation [104]  PT Acc Code (Do Not Modify): Observation [10022]       B Medical/Surgery History Past Medical History:  Diagnosis Date  . Benign hypertensive heart disease without heart failure   . Heart murmur, systolic    MECHNICAL  . Hypertension    mild, lost weight, no meds now  . Hypothyroidism   . Osteoporosis   . Primary osteoarthritis of right hip 06/18/2018   Past Surgical History:  Procedure Laterality Date  . APPENDECTOMY    . BACK  SURGERY    . COLONOSCOPY    . FRACTURE SURGERY    . HAMMER TOE SURGERY Left 07/14/2013   Procedure: HAMMER TOE CORRECTION LEFT SECOND TOE, AUSTIN/AKIN LEFT;  Surgeon: [name], DPM;  Location: [facility];  Service: Podiatry;  Laterality: Left;  . JOINT REPLACEMENT    . LAPAROSCOPIC APPENDECTOMY N/A 12/29/2016   Procedure: APPENDECTOMY LAPAROSCOPIC;  Surgeon: Excell Seltzer, MD;  [facility];  Service: General;  Laterality: N/A;  . [place]  . LUMBAR DISKECTOMY W/ DISSECTION, LEFT L4 -- L5  02-23-2004  . METATARSAL OSTEOTOMY Left 04/02/2015   Procedure: LEFT FOOT METATARSAL OSTEOTOMY;  Surgeon: [name], MD;  Location: [facility];  Service: Orthopedics;  Laterality: Left;  . METATARSAL OSTEOTOMY Left 08/18/2016   Procedure: METATARSAL OSTEOTOMY SECOND TOE;  Surgeon: [name], MD;  Location: [facility];  Service: Orthopedics;  Laterality: Left;  . ORIF LEFT DISTAL RADIAL FX W/ BONE GRAFT  01-14-2009  . POSTERIOR LUMBAR FUSION  05-16-2005   L4 -- L5  . RE-DO LUMBAR DISKECTOMY , L4  - L5  05-03-2004  . TOTAL HIP ARTHROPLASTY Right 06/18/2018  . TOTAL HIP ARTHROPLASTY Right 06/18/2018   Procedure: RIGHT TOTAL HIP ARTHROPLASTY ANTERIOR APPROACH;  Surgeon: [name], MD;  Location: [facility];  Service: Orthopedics;  Laterality: Right;  . TRIGGER FINGER RELEASE  01/09/2012   Procedure: RELEASE TRIGGER FINGER/A-1 PULLEY;  Surgeon: [name], MD;  Location: [facility];  Service: Orthopedics;  Laterality: Left;  [place] VAGINAL HYSTERECTOMY  1993   W/ BILATERAL  SALPINGOOPHORECTOMY  . WRIST FRACTURE SURGERY Left      A IV Location/Drains/Wounds Patient Lines/Drains/Airways Status   Active Line/Drains/Airways    Name:   Placement date:   Placement time:   Site:   Days:   Peripheral IV (Ped) 03/09/19 Antecubital   03/09/19    1252     less than 1   Incision (Closed) 06/18/18 Hip  Right   06/18/18    1059     264          Intake/Output Last 24 hours No intake or output data in the 24 hours ending 03/09/19 1458  Labs/Imaging Results for orders placed or performed during the hospital encounter of 03/09/19 (from the past 48 hour(s))  Comprehensive metabolic panel     Status: Abnormal   Collection Time: 03/09/19 12:21 PM  Result Value Ref Range   Sodium 133 (L) 135 - 145 mmol/L   Potassium 3.9 3.5 - 5.1 mmol/L   Chloride 96 (L) 98 - 111 mmol/L   CO2 23 22 - 32 mmol/L   Glucose, Bld 122 (H) 70 - 99 mg/dL   BUN 27 (H) 8 - 23 mg/dL   Creatinine, Ser 1.56 (H) 0.44 - 1.00 mg/dL   Calcium 9.2 8.9 - 10.3 mg/dL   Total Protein 7.1 6.5 - 8.1 g/dL   Albumin 4.2 3.5 - 5.0 g/dL   AST 101 (H) 15 - 41 U/L   ALT 70 (H) 0 - 44 U/L   Alkaline Phosphatase 95 38 - 126 U/L   Total Bilirubin 0.7 0.3 - 1.2 mg/dL   GFR calc non Af Amer 34 (L) >60 mL/min   GFR calc Af Amer 40 (L) >60 mL/min   Anion gap 14 5 - 15    Comment: Performed at [facility], [address] [place], [place] [address]  CBC WITH DIFFERENTIAL     Status: Abnormal   Collection Time: 03/09/19 12:21 PM  Result Value Ref Range   WBC 6.4 4.0 - 10.5 K/uL   RBC 3.84 (L) 3.87 - 5.11 MIL/uL   Hemoglobin 13.3 12.0 - 15.0 g/dL   HCT 39.3 36.0 - 46.0 %   MCV 102.3 (H) 80.0 - 100.0 fL   MCH 34.6 (H) 26.0 - 34.0 pg   MCHC 33.8 30.0 - 36.0 g/dL   RDW 13.0 11.5 - 15.5 %   Platelets 253 150 - 400 K/uL   nRBC 0.0 0.0 - 0.2 %   Neutrophils Relative % 56 %   Neutro Abs 3.7 1.7 - 7.7 K/uL   Lymphocytes Relative 33 %   Lymphs Abs 2.1 0.7 - 4.0 K/uL   Monocytes Relative 8 %   Monocytes Absolute 0.5 0.1 - 1.0 K/uL   Eosinophils Relative 1 %   Eosinophils Absolute 0.0 0.0 - 0.5 K/uL   Basophils Relative 1 %   Basophils Absolute 0.1 0.0 - 0.1 K/uL   Immature Granulocytes 1 %   Abs Immature Granulocytes 0.03 0.00 - 0.07 K/uL    Comment: Performed at [facility] [address] [place], [place] [address]   Protime-INR     Status: None   Collection Time: 03/09/19 12:21 PM  Result Value Ref Range   Prothrombin Time 13.9 11.4 - 15.2 seconds   INR 1.1 0.8 - 1.2    Comment: (NOTE) INR goal varies based on device and disease states. Performed at [facility], [place] [address], [place], [place] [address]   SARS Coronavirus 2 (  CEPHEID - Performed in [facility] hospital lab), Hosp Order     Status: None   Collection Time: 03/09/19 12:23 PM   Specimen: Nasopharyngeal Swab  Result Value Ref Range   SARS Coronavirus 2 NEGATIVE NEGATIVE    Comment: (NOTE) If result is NEGATIVE SARS-CoV-2 target nucleic acids are NOT DETECTED. The SARS-CoV-2 RNA is generally detectable in upper and lower  respiratory specimens during the acute phase of infection. The lowest  concentration of SARS-CoV-2 viral copies this assay can detect is 250  copies / mL. A negative result does not preclude SARS-CoV-2 infection  and should not be used as the sole basis for treatment or other  patient management decisions.  A negative result may occur with  improper specimen collection / handling, submission of specimen other  than nasopharyngeal swab, presence of viral mutation(s) within the  areas targeted by this assay, and inadequate number of viral copies  (<250 copies / mL). A negative result must be combined with clinical  observations, patient history, and epidemiological information. If result is POSITIVE SARS-CoV-2 target nucleic acids are DETECTED. The SARS-CoV-2 RNA is generally detectable in upper and lower  respiratory specimens dur ing the acute phase of infection.  Positive  results are indicative of active infection with SARS-CoV-2.  Clinical  correlation with patient history and other diagnostic information is  necessary to determine patient infection status.  Positive results do  not rule out bacterial infection or co-infection with other viruses. If result is PRESUMPTIVE POSTIVE SARS-CoV-2 nucleic  acids MAY BE PRESENT.   A presumptive positive result was obtained on the submitted specimen  and confirmed on repeat testing.  While 2019 novel coronavirus  (SARS-CoV-2) nucleic acids may be present in the submitted sample  additional confirmatory testing may be necessary for epidemiological  and / or clinical management purposes  to differentiate between  SARS-CoV-2 and other Sarbecovirus currently known to infect humans.  If clinically indicated additional testing with an alternate test  methodology [phone]) is advised. The SARS-CoV-2 RNA is generally  detectable in upper and lower respiratory sp ecimens during the acute  phase of infection. The expected result is Negative. Fact Sheet for Patients:  StrictlyIdeas.no Fact Sheet for Healthcare Providers: BankingDealers.co.za This test is not yet approved or cleared by the [place] FDA and has been authorized for detection and/or diagnosis of SARS-CoV-2 by FDA under an Emergency Use Authorization (EUA).  This EUA will remain in effect (meaning this test can be used) for the duration of the COVID-19 declaration under Section 564(b)(1) of the Act, 21 U.S.C. section 360bbb-3(b)(1), unless the authorization is terminated or revoked sooner. Performed at [facility], [place] [address] [place], [place] [address]   Type and screen [facility]     Status: None   Collection Time: 03/09/19 12:35 PM  Result Value Ref Range   ABO/RH(D) A POS    Antibody Screen NEG    Sample Expiration      03/12/2019,2359 Performed at [facility], [place] [address], [place], [place] [address]   POC occult blood, ED     Status: None   Collection Time: 03/09/19 12:35 PM  Result Value Ref Range   Fecal Occult Bld NEGATIVE NEGATIVE  ABO/Rh     Status: None (Preliminary result)   Collection Time: 03/09/19 12:35 PM  Result Value Ref Range   ABO/RH(D)      A POS Performed at [facility] [address]  [place], [place] [address]    No results found.  Pending Labs Unresulted Labs (From admission, onward)    Start     Ordered   03/09/19 1456  Hepatitis c vrs RNA detect by PCR-qual  Once,   STAT     03/09/19 1456   03/09/19 1456  Hepatitis panel, acute  Once,   STAT     03/09/19 1456          Vitals/Pain Today's Vitals   03/09/19 1245 03/09/19 1338 03/09/19 1400 03/09/19 1415  BP: 129/73  122/73 126/75  Pulse: 78  74 77  Resp:      Temp:      TempSrc:      SpO2: 98%  99% 98%  Weight:      Height:      PainSc:  0-No pain      Isolation Precautions No active isolations  Medications Medications  pantoprazole (PROTONIX) 80 mg in sodium chloride 0.9 % 250 mL (0.32 mg/mL) infusion (8 mg/hr Intravenous New Bag/Given 03/09/19 1430)  0.9 %  sodium chloride infusion ( Intravenous New Bag/Given 03/09/19 1252)  ondansetron (ZOFRAN) injection 4 mg (4 mg Intravenous Given 03/09/19 1253)  pantoprazole (PROTONIX) 80 mg in sodium chloride 0.9 % 100 mL IVPB (80 mg Intravenous New Bag/Given 03/09/19 1335)  sodium chloride 0.9 % bolus 1,000 mL (1,000 mLs Intravenous New Bag/Given 03/09/19 1437)    Mobility walks Low fall risk   Focused Assessments Cardiac Assessment Handoff:  Cardiac Rhythm: Normal sinus rhythm No results found for: CKTOTAL, CKMB, CKMBINDEX, TROPONINI No results found for: DDIMER Does the Patient currently have chest pain? No      R Recommendations: See Admitting Provider Note  Report given to:   Additional Notes:

## 2019-03-09 NOTE — ED Triage Notes (Signed)
Patient states she has been having periods of weakness for several days. Started feeling nauseated today an had 5 episodes of vomiting bright red blood with clots. Denies pain.

## 2019-03-09 NOTE — ED Notes (Signed)
Attempted to call report. Nurse unavailable and will call back.

## 2019-03-09 NOTE — Progress Notes (Signed)
NEW ADMISSION NOTE New Admission Note:  Arrival Method: Stretcher Mental Orientation: Alert and oriented x 4 Telemetry:None ordered. Assessment: Completed Skin:No skin issue ,assessed with Carla R.N. UG:AYGE forearm Pain:Not in pain. Tubes: PIV. Safety Measures: Safety Fall Prevention Plan has been given, discussed and signed Admission: Indorsed [facility] [place] Orientation: Patient has been orientated to the room, unit and staff.  Family:None  Orders have been reviewed and implemented. Will continue to monitor the patient. Call light has been placed within reach and bed alarm has been activated.   [place], Zenon Mayo, RN

## 2019-03-09 NOTE — Consult Note (Addendum)
[facility] Consult Note   History Brittany Washington MRN # [id number]  Date of Admission: 03/09/2019 Date of Consultation: 03/09/2019 Referring physician: Dr. Lady Deutscher, MD Primary Care Provider: Marton Redwood, MD Primary Gastroenterologist: Dr. 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  She said she had felt somewhat fatigued in the last couple of days but did not think much of it.  This morning she and her husband were taking a walk, and toward the end she felt unwell and went to the bathroom and vomited bright red blood with clots 4-5 times in a short period of time.  That occurred at about 10 or 11 this morning, and there have been no more episodes since then.  She has not had melena or passage of maroon or bright red blood per rectum.  She denies chronic abdominal pain.  On occasion she will get a brief burning epigastric discomfort with certain foods, and will take her Protonix.  It sounds that she does that rarely.  She is on ibuprofen 600 mg daily for right hip pain after prior hip replacement and also chronic back pain after prior surgery.  She denies dysphagia, odynophagia or anorexia.  She has had about a 20 pound weight loss in the last couple of months, but attributes that to eating healthier and walking a lot more since she has been home during pandemic.  She denies fever cough chills sputum production or loss of smell/taste, and her rapid COVID test is negative.  ROS:  Musculoskeletal:   Back and right hip pain   All other systems are negative except as noted above in the HPI  Past Medical History Past Medical History:  Diagnosis Date  . Benign hypertensive heart disease without heart failure   . Heart murmur, systolic    MECHNICAL   . Hypertension    mild, lost weight, no meds now  . Hypothyroidism   . Osteoporosis   . Primary osteoarthritis of right hip 06/18/2018   Last colonoscopy in 2015 with Dr. Henrene Pastor.  Past Surgical History Past Surgical History:  Procedure Laterality Date  . APPENDECTOMY    . BACK SURGERY    . COLONOSCOPY    . FRACTURE SURGERY    . HAMMER TOE SURGERY Left 07/14/2013   Procedure: HAMMER TOE CORRECTION LEFT SECOND TOE, AUSTIN/AKIN LEFT;  Surgeon: [name], DPM;  Location: [facility];  Service: Podiatry;  Laterality: Left;  . JOINT REPLACEMENT    . LAPAROSCOPIC APPENDECTOMY N/A 12/29/2016   Procedure: APPENDECTOMY LAPAROSCOPIC;  Surgeon: Excell Seltzer, MD;  Location: [facility];  Service: General;  Laterality: N/A;  . [place]  . LUMBAR DISKECTOMY W/ DISSECTION, LEFT L4 -- L5  02-23-2004  . METATARSAL OSTEOTOMY Left 04/02/2015   Procedure: LEFT FOOT METATARSAL OSTEOTOMY;  Surgeon: [name], MD;  Location: [facility];  Service: Orthopedics;  Laterality: Left;  . METATARSAL OSTEOTOMY Left 08/18/2016   Procedure: METATARSAL OSTEOTOMY SECOND TOE;  Surgeon: [name], MD;  Location: [facility];  Service: Orthopedics;  Laterality: Left;  . ORIF LEFT DISTAL RADIAL FX W/ BONE GRAFT  01-14-2009  . POSTERIOR LUMBAR FUSION  05-16-2005   L4 -- L5  . RE-DO LUMBAR DISKECTOMY , L4  - L5  05-03-2004  .  TOTAL HIP ARTHROPLASTY Right 06/18/2018  . TOTAL HIP ARTHROPLASTY Right 06/18/2018   Procedure: RIGHT TOTAL HIP ARTHROPLASTY ANTERIOR APPROACH;  Surgeon: [name], MD;  Location: [facility];  Service: Orthopedics;  Laterality: Right;  . TRIGGER FINGER RELEASE  01/09/2012   Procedure: RELEASE TRIGGER FINGER/A-1 PULLEY;  Surgeon: [name], MD;  Location: [facility];  Service: Orthopedics;  Laterality: Left;  [place] VAGINAL HYSTERECTOMY  1993   W/ BILATERAL SALPINGOOPHORECTOMY  . WRIST FRACTURE  SURGERY Left     Family History Family History  Problem Relation Age of Onset  . Heart attack Father   . Colon cancer Father   . Heart attack Brother   . Ovarian cancer Mother   . Rectal cancer Neg Hx   . Stomach cancer Neg Hx   . Breast cancer Neg Hx     Social History Social History   Socioeconomic History  . Marital status: Married    Spouse name: Not on file  . Number of children: 1  . Years of education: 68  . Highest education level: Not on file  Occupational History  . Occupation: Therapist, sports  Social Needs  . Financial resource strain: Not on file  . Food insecurity    Worry: Not on file    Inability: Not on file  . Transportation needs    Medical: Not on file    Non-medical: Not on file  Tobacco Use  . Smoking status: Never Smoker  . Smokeless tobacco: Never Used  Substance and Sexual Activity  . Alcohol use: Yes    Alcohol/week: 7.0 standard drinks    Types: 7 Glasses of wine per week    Comment: 1 WINE DAILY  . Drug use: No  . Sexual activity: Yes  Lifestyle  . Physical activity    Days per week: Not on file    Minutes per session: Not on file  . Stress: Not on file  Relationships  . Social Herbalist on phone: Not on file    Gets together: Not on file    Attends religious service: Not on file    Active member of club or organization: Not on file    Attends meetings of clubs or organizations: Not on file    Relationship status: Not on file  Other Topics Concern  . Not on file  Social History Narrative   Patient drinks about 2 cups of caffeine daily.   Patient is right handed.    She reports 2 glasses of wine nightly.  Allergies Allergies  Allergen Reactions  . Sulfa Antibiotics Diarrhea and Other (See Comments)    Body aches     Outpatient Meds Home medications from the H+P and/or nursing med reconciliation reviewed.  Ibuprofen 600 mg daily, Klonopin nightly for sleep, Synthroid daily, Protonix on occasion for  dyspepsia.  _____________________________________________________________________ Objective   Exam:  Current vital signs  Patient Vitals for the past 8 hrs:  BP Temp Temp src Pulse Resp SpO2 Height Weight  03/09/19 1415 126/75 - - 77 - 98 % - -  03/09/19 1400 122/73 - - 74 - 99 % - -  03/09/19 1245 129/73 - - 78 - 98 % - -  03/09/19 1230 134/73 - - 79 - 96 % - -  03/09/19 1204 - - - - - - 5\' 4"  (1.626 m) 67.6 kg  03/09/19 1203 (!) 158/86 - - 77 (!) 6 99 % - -  03/09/19 1200 (!) 158/86 - -  80 - 97 % - -  03/09/19 1155 122/76 (!) 97.4 F (36.3 C) Oral 88 16 99 % - -   No intake or output data in the 24 hours ending 03/09/19 1527  Physical Exam:    General: this is a well-appearing female patient in no acute distress  Eyes: sclera anicteric, no redness  ENT: oral mucosa moist without lesions, no cervical or supraclavicular lymphadenopathy.  Oropharynx not visualized, patient wearing a mask  CV: RRR with soft systolic murmur, Q1/J9, no JVD,, no peripheral edema  Resp: clear to auscultation bilaterally, normal RR and effort noted  GI: soft, no tenderness, with active bowel sounds. No guarding or palpable organomegaly noted  Skin; warm and dry, no rash or jaundice noted  Neuro: awake, alert and oriented x 3. Normal gross motor function and fluent speech.  Labs:  CBC Latest Ref Rng & Units 03/09/2019 06/06/2018 12/31/2016  WBC 4.0 - 10.5 K/uL 6.4 6.6 10.8(H)  Hemoglobin 12.0 - 15.0 g/dL 13.3 14.0 11.4(L)  Hematocrit 36.0 - 46.0 % 39.3 43.6 34.5(L)  Platelets 150 - 400 K/uL 253 214 151  MCV elevated chronically  CMP Latest Ref Rng & Units 03/09/2019 06/06/2018 12/30/2016  Glucose 70 - 99 mg/dL 122(H) 95 202(H)  BUN 8 - 23 mg/dL 27(H) 22 9  Creatinine 0.44 - 1.00 mg/dL 1.56(H) 1.01(H) 0.80  Sodium 135 - 145 mmol/L 133(L) 138 136  Potassium 3.5 - 5.1 mmol/L 3.9 4.1 3.7  Chloride 98 - 111 mmol/L 96(L) 104 106  CO2 22 - 32 mmol/L 23 24 23   Calcium 8.9 - 10.3 mg/dL 9.2 9.5  8.6(L)  Total Protein 6.5 - 8.1 g/dL 7.1 - -  Total Bilirubin 0.3 - 1.2 mg/dL 0.7 - -  Alkaline Phos 38 - 126 U/L 95 - -  AST 15 - 41 U/L 101(H) - -  ALT 0 - 44 U/L 70(H) - -    Recent Labs  Lab 03/09/19 1221  INR 1.1   Unknown if previous hepatitis virus testing  Radiologic studies:  None  @ASSESSMENTPLANBEGIN @ Impression:  Hematemesis  There was blood upon the first episode of vomiting, rather than after retching for a period of time to suggest Mallory-Weiss tear.  Chronic ibuprofen use raises suspicion of peptic ulcer.  Fortunately, she has had no further overt bleeding and admission hemoglobin normal.  Her vital signs are normal and she looks comfortable.  Elevated LFTs,?  Alcohol related, given her reported use and pattern of elevation along with elevated MCV. After the bleeding issue was attended to, this can be addressed as an outpatient to see if it is ever been worked up before she is with ultrasound or viral hepatitis panel.  Plan:  Admit to the internal medicine hospital service  2 peripheral IVs have been placed  She has received Protonix 80 mg bolus and is on an 8 mg/h infusion.  We will obtain a hemoglobin and hematocrit later this evening and a CBC tomorrow morning.  Upper endoscopy tomorrow.  This would be with my partner Dr. Oretha Caprice, who will manage the consult service starting tomorrow.  She is agreeable after discussion of risks and benefits.  The benefits and risks of the planned procedure were described in detail with the patient or (when appropriate) their health care proxy.  Risks were outlined as including, but not limited to, bleeding, infection, perforation, adverse medication reaction leading to cardiac or pulmonary decompensation.  The limitation of incomplete mucosal visualization was also discussed.  No guarantees or  warranties were given.     If necessary, Dr. Silvano Rusk is on call overnight in case this patient should develop brisk  rebleeding requiring urgent attention. Only ice chips and water for now, then NPO after MN.   I updated her husband by phone, and will give signout to my partners on her.  Thank you for the courtesy of this consult.  Please contact me with any questions or concerns.  Nelida Meuse III Office: [phone]

## 2019-03-10 ENCOUNTER — Observation Stay (HOSPITAL_COMMUNITY): Payer: Medicare Other | Admitting: Anesthesiology

## 2019-03-10 ENCOUNTER — Encounter (HOSPITAL_COMMUNITY)
Admission: EM | Disposition: A | Discharge status: Home / Self Care | Payer: Self-pay | Source: Home / Self Care | Attending: Emergency Medicine

## 2019-03-10 ENCOUNTER — Encounter (HOSPITAL_COMMUNITY): Payer: Self-pay | Admitting: *Deleted

## 2019-03-10 DIAGNOSIS — M25551 Pain in right hip: Secondary | ICD-10-CM | POA: Diagnosis not present

## 2019-03-10 DIAGNOSIS — N179 Acute kidney failure, unspecified: Secondary | ICD-10-CM | POA: Diagnosis not present

## 2019-03-10 DIAGNOSIS — Z20828 Contact with and (suspected) exposure to other viral communicable diseases: Secondary | ICD-10-CM | POA: Diagnosis not present

## 2019-03-10 DIAGNOSIS — K2970 Gastritis, unspecified, without bleeding: Secondary | ICD-10-CM | POA: Diagnosis not present

## 2019-03-10 DIAGNOSIS — K2931 Chronic superficial gastritis with bleeding: Secondary | ICD-10-CM | POA: Diagnosis present

## 2019-03-10 DIAGNOSIS — M549 Dorsalgia, unspecified: Secondary | ICD-10-CM | POA: Diagnosis not present

## 2019-03-10 DIAGNOSIS — K2990 Gastroduodenitis, unspecified, without bleeding: Secondary | ICD-10-CM | POA: Diagnosis not present

## 2019-03-10 DIAGNOSIS — I119 Hypertensive heart disease without heart failure: Secondary | ICD-10-CM | POA: Diagnosis not present

## 2019-03-10 DIAGNOSIS — K2951 Unspecified chronic gastritis with bleeding: Secondary | ICD-10-CM | POA: Diagnosis not present

## 2019-03-10 DIAGNOSIS — K920 Hematemesis: Secondary | ICD-10-CM | POA: Diagnosis not present

## 2019-03-10 DIAGNOSIS — R7989 Other specified abnormal findings of blood chemistry: Secondary | ICD-10-CM | POA: Diagnosis not present

## 2019-03-10 DIAGNOSIS — K760 Fatty (change of) liver, not elsewhere classified: Secondary | ICD-10-CM | POA: Diagnosis present

## 2019-03-10 DIAGNOSIS — E039 Hypothyroidism, unspecified: Secondary | ICD-10-CM | POA: Diagnosis not present

## 2019-03-10 DIAGNOSIS — G8929 Other chronic pain: Secondary | ICD-10-CM | POA: Diagnosis not present

## 2019-03-10 DIAGNOSIS — R945 Abnormal results of liver function studies: Secondary | ICD-10-CM | POA: Diagnosis not present

## 2019-03-10 DIAGNOSIS — K2991 Gastroduodenitis, unspecified, with bleeding: Secondary | ICD-10-CM | POA: Diagnosis not present

## 2019-03-10 DIAGNOSIS — Z791 Long term (current) use of non-steroidal anti-inflammatories (NSAID): Secondary | ICD-10-CM | POA: Diagnosis not present

## 2019-03-10 HISTORY — PX: ESOPHAGOGASTRODUODENOSCOPY: SHX5428

## 2019-03-10 HISTORY — PX: BIOPSY: SHX5522

## 2019-03-10 LAB — COMPREHENSIVE METABOLIC PANEL
ALT: 55 U/L — ABNORMAL HIGH (ref 0–44)
AST: 68 U/L — ABNORMAL HIGH (ref 15–41)
Albumin: 3.2 g/dL — ABNORMAL LOW (ref 3.5–5.0)
Alkaline Phosphatase: 70 U/L (ref 38–126)
Anion gap: 8 (ref 5–15)
BUN: 11 mg/dL (ref 8–23)
CO2: 22 mmol/L (ref 22–32)
Calcium: 7.9 mg/dL — ABNORMAL LOW (ref 8.9–10.3)
Chloride: 109 mmol/L (ref 98–111)
Creatinine, Ser: 1.21 mg/dL — ABNORMAL HIGH (ref 0.44–1.00)
GFR calc Af Amer: 54 mL/min — ABNORMAL LOW (ref >60)
GFR calc non Af Amer: 47 mL/min — ABNORMAL LOW (ref >60)
Glucose, Bld: 96 mg/dL (ref 70–99)
Potassium: 3.9 mmol/L (ref 3.5–5.1)
Sodium: 139 mmol/L (ref 135–145)
Total Bilirubin: 1.2 mg/dL (ref 0.3–1.2)
Total Protein: 5.6 g/dL — ABNORMAL LOW (ref 6.5–8.1)

## 2019-03-10 LAB — CBC
HCT: 33 % — ABNORMAL LOW (ref 36.0–46.0)
Hemoglobin: 11.2 g/dL — ABNORMAL LOW (ref 12.0–15.0)
MCH: 35.1 pg — ABNORMAL HIGH (ref 26.0–34.0)
MCHC: 33.9 g/dL (ref 30.0–36.0)
MCV: 103.4 fL — ABNORMAL HIGH (ref 80.0–100.0)
Platelets: 159 10*3/uL (ref 150–400)
RBC: 3.19 MIL/uL — ABNORMAL LOW (ref 3.87–5.11)
RDW: 13.2 % (ref 11.5–15.5)
WBC: 4.4 10*3/uL (ref 4.0–10.5)
nRBC: 0 % (ref 0.0–0.2)

## 2019-03-10 SURGERY — EGD (ESOPHAGOGASTRODUODENOSCOPY)
Anesthesia: Monitor Anesthesia Care

## 2019-03-10 MED ORDER — LEVOTHYROXINE SODIUM 112 MCG PO TABS
112.0000 ug | ORAL_TABLET | Freq: Every day | ORAL | Status: DC
  Administered 2019-03-10 – 2019-03-11 (×2): 112 ug via ORAL
  Filled 2019-03-10 (×2): qty 1

## 2019-03-10 MED ORDER — LACTATED RINGERS IV SOLN
INTRAVENOUS | Status: DC
  Administered 2019-03-10: 09:00:00 via INTRAVENOUS

## 2019-03-10 MED ORDER — CLONAZEPAM 1 MG PO TABS
2.0000 mg | ORAL_TABLET | Freq: Every day | ORAL | Status: DC
  Administered 2019-03-10: 2 mg via ORAL
  Filled 2019-03-10: qty 2

## 2019-03-10 MED ORDER — KETOTIFEN FUMARATE 0.025 % OP SOLN
1.0000 [drp] | Freq: Every day | OPHTHALMIC | Status: DC
  Administered 2019-03-10 – 2019-03-11 (×2): 1 [drp] via OPHTHALMIC
  Filled 2019-03-10: qty 5

## 2019-03-10 MED ORDER — LIDOCAINE 2% (20 MG/ML) 5 ML SYRINGE
INTRAMUSCULAR | Status: DC | PRN
  Administered 2019-03-10: 40 mg via INTRAVENOUS

## 2019-03-10 MED ORDER — METOPROLOL TARTRATE 12.5 MG HALF TABLET
12.5000 mg | ORAL_TABLET | Freq: Every day | ORAL | Status: DC
  Administered 2019-03-10 – 2019-03-11 (×2): 12.5 mg via ORAL
  Filled 2019-03-10 (×2): qty 1

## 2019-03-10 MED ORDER — PROPOFOL 10 MG/ML IV BOLUS
INTRAVENOUS | Status: DC | PRN
  Administered 2019-03-10 (×2): 20 mg via INTRAVENOUS

## 2019-03-10 MED ORDER — METHOCARBAMOL 500 MG PO TABS
500.0000 mg | ORAL_TABLET | Freq: Every evening | ORAL | Status: DC | PRN
  Administered 2019-03-10: 500 mg via ORAL
  Filled 2019-03-10: qty 1

## 2019-03-10 MED ORDER — PANTOPRAZOLE SODIUM 40 MG PO TBEC
40.0000 mg | DELAYED_RELEASE_TABLET | Freq: Every day | ORAL | Status: DC
  Administered 2019-03-10 – 2019-03-11 (×2): 40 mg via ORAL
  Filled 2019-03-10 (×2): qty 1

## 2019-03-10 MED ORDER — PROPOFOL 500 MG/50ML IV EMUL
INTRAVENOUS | Status: DC | PRN
  Administered 2019-03-10: 75 ug/kg/min via INTRAVENOUS

## 2019-03-10 MED ORDER — SODIUM CHLORIDE 0.9 % IV SOLN: INTRAVENOUS | Status: DC

## 2019-03-10 NOTE — Op Note (Signed)
[facility] Patient Name: Brittany Washington Procedure Date : 03/10/2019 MRN: [id number] Attending MD: Milus Banister , MD Date of Birth: September 26, 1950 CSN: [id number] Age: 68 Admit Type: Inpatient Procedure:                Upper GI endoscopy Indications:              Hematemesis, 600mg  ibuprofen daily for many months Providers:                Milus Banister, MD, Carmie End, RN, Ladona Ridgel, Technician Referring MD:              Medicines:                Monitored Anesthesia Care Complications:            No immediate complications. Estimated blood loss:                            None. Estimated Blood Loss:     Estimated blood loss: none. Procedure:                Pre-Anesthesia Assessment:                           - Prior to the procedure, a History and Physical                            was performed, and patient medications and                            allergies were reviewed. The patient's tolerance of                            previous anesthesia was also reviewed. The risks                            and benefits of the procedure and the sedation                            options and risks were discussed with the patient.                            All questions were answered, and informed consent                            was obtained. Prior Anticoagulants: The patient has                            taken no previous anticoagulant or antiplatelet                            agents. ASA Grade Assessment: II - A patient with  mild systemic disease. After reviewing the risks                            and benefits, the patient was deemed in                            satisfactory condition to undergo the procedure.                           After obtaining informed consent, the endoscope was                            passed under direct vision. Throughout the                            procedure, the  patient's blood pressure, pulse, and                            oxygen saturations were monitored continuously. The                            GIF-H190 ([id number] Olympus gastroscope was                            introduced through the mouth, and advanced to the                            second part of duodenum. The upper GI endoscopy was                            accomplished without difficulty. The patient                            tolerated the procedure well. Scope In: Scope Out: Findings:      No blood in the UGI tract.      Mild inflammation characterized by erythema, friability and granularity       was found in the entire examined stomach. Biopsies were taken with a       cold forceps for histology.      The exam was otherwise without abnormality (no signs of portal       hypertension). Impression:               - There was no blood in the UGI tract.                           - Mild to moderate pan-gastritis, biopsied to check                            for H. pylori.                           - No signs of portal hypertension.                           -  Bleeding (overt hematemesis) may have been from                            an unseen AVM or perhaps gastritis that was noted. Recommendation:           - Return patient to hospital ward for observation.                            Will stop PPI infusion and start once daily PPI                            orally. Will allow her to eat sold food as well.                            She should avoid NSAIDs as best as possible, try                            tylenol for pains.                           - If she has overt rebleeding, will need repeat EGD.                           - If no overt rebleeding in the next 24 hours she                            is probably safe for d/c home. Procedure Code(s):        --- Professional ---                           [phone], Esophagogastroduodenoscopy, flexible,                             transoral; with biopsy, single or multiple Diagnosis Code(s):        --- Professional ---                           K29.70, Gastritis, unspecified, without bleeding                           K92.0, Hematemesis CPT copyright 2019 American Medical Association. All rights reserved. The codes documented in this report are preliminary and upon coder review may  be revised to meet current compliance requirements. Milus Banister, MD 03/10/2019 9:36:52 AM This report has been signed electronically. Number of Addenda: 0

## 2019-03-10 NOTE — Interval H&P Note (Signed)
History and Physical Interval Note:  03/10/2019 8:56 AM  Brittany Washington  has presented today for surgery, with the diagnosis of hematemesis.  The various methods of treatment have been discussed with the patient and family. After consideration of risks, benefits and other options for treatment, the patient has consented to  Procedure(s): ESOPHAGOGASTRODUODENOSCOPY (EGD) (N/A) as a surgical intervention.  The patient's history has been reviewed, patient examined, no change in status, stable for surgery.  I have reviewed the patient's chart and labs.  Questions were answered to the patient's satisfaction.     Milus Banister

## 2019-03-10 NOTE — Anesthesia Preprocedure Evaluation (Signed)
Anesthesia Evaluation  Patient identified by MRN, date of birth, ID band Patient awake    Reviewed: Allergy & Precautions, NPO status , Patient's Chart, lab work & pertinent test results  Airway Mallampati: II  TM Distance: >3 FB Neck ROM: Full    Dental no notable dental hx. (+) Teeth Intact   Pulmonary neg pulmonary ROS,    Pulmonary exam normal breath sounds clear to auscultation       Cardiovascular hypertension, Normal cardiovascular exam Rhythm:Regular Rate:Normal     Neuro/Psych  Headaches, negative psych ROS   GI/Hepatic Neg liver ROS, GERD  Medicated and Controlled,Hematemesis   Endo/Other  Hypothyroidism   Renal/GU Renal diseaseHx/o AKI with perforated appendix  negative genitourinary   Musculoskeletal  (+) Arthritis , Osteoarthritis,    Abdominal   Peds  Hematology   Anesthesia Other Findings   Reproductive/Obstetrics                             Anesthesia Physical Anesthesia Plan  ASA: II  Anesthesia Plan: MAC   Post-op Pain Management:    Induction:   PONV Risk Score and Plan: 2 and Propofol infusion and Treatment may vary due to age or medical condition  Airway Management Planned: Natural Airway and Nasal Cannula  Additional Equipment:   Intra-op Plan:   Post-operative Plan:   Informed Consent: I have reviewed the patients History and Physical, chart, labs and discussed the procedure including the risks, benefits and alternatives for the proposed anesthesia with the patient or authorized representative who has indicated his/her understanding and acceptance.     Dental advisory given  Plan Discussed with: CRNA  Anesthesia Plan Comments:         Anesthesia Quick Evaluation

## 2019-03-10 NOTE — Progress Notes (Signed)
Patient stated that she did not want me to update family. She stated that she has already called her husband. I informed her that if her or her family had any questions, that I would be happy to answer them.

## 2019-03-10 NOTE — Anesthesia Postprocedure Evaluation (Signed)
Anesthesia Post Note  Patient: Brittany Washington  Procedure(s) Performed: ESOPHAGOGASTRODUODENOSCOPY (EGD) (N/A ) BIOPSY     Patient location during evaluation: PACU Anesthesia Type: MAC Level of consciousness: awake and alert and oriented Pain management: pain level controlled Vital Signs Assessment: post-procedure vital signs reviewed and stable Respiratory status: spontaneous breathing, nonlabored ventilation and respiratory function stable Cardiovascular status: stable and blood pressure returned to baseline Postop Assessment: no apparent nausea or vomiting Anesthetic complications: no    Last Vitals:  Vitals:   03/10/19 0945 03/10/19 0955  BP: 121/73 122/74  Pulse: 69 69  Resp: 15 16  Temp:    SpO2: 99% 99%    Last Pain:  Vitals:   03/10/19 0955  TempSrc:   PainSc: 0-No pain                 Jacqeline Broers A.

## 2019-03-10 NOTE — Transfer of Care (Signed)
Immediate Anesthesia Transfer of Care Note  Patient: Brittany Washington  Procedure(s) Performed: ESOPHAGOGASTRODUODENOSCOPY (EGD) (N/A ) BIOPSY  Patient Location: Endoscopy Unit  Anesthesia Type:MAC  Level of Consciousness: awake, alert  and oriented  Airway & Oxygen Therapy: Patient Spontanous Breathing  Post-op Assessment: Report given to RN and Post -op Vital signs reviewed and stable  Post vital signs: Reviewed and stable  Last Vitals:  Vitals Value Taken Time  BP    Temp    Pulse    Resp    SpO2      Last Pain:  Vitals:   03/10/19 0823  TempSrc: Oral  PainSc: 0-No pain         Complications: No apparent anesthesia complications

## 2019-03-10 NOTE — Progress Notes (Signed)
[facility] PROGRESS NOTE  Brittany Washington HUD:[id number] DOB: [birth year] DOA: 03/09/2019 PCP: Marton Redwood, MD  Assessment/Plan:  1.  Hematemesis: in setting of nsaid use and moderate etoh use. No further episodes. EGD today with  no blood in the UGI tract.  Mild to moderate pan-gastritis, biopsied to check for H. Pylori.  No signs of portal hypertension.  Bleeding (overt hematemesis) may have been from an unseen AVM or perhaps gastritis that was noted.  -ppi daily -start diet -observe for re-bleeding -if no re-bleeding discharge in am  2.  Acute kidney injury: Creatinine 1.56 on admission. Given gentle fluids and creatinine 1.2 this am.  -avoid nephrotoxins -monitor urine output  3.  Primary hypertension: Controlled. Home meds include HCTZ, diovan and metoprolol.  -holding diovan and HCTZ due to 2 -continue metoprolol -resume home meds when indicated  4.  Abnormal LFTs: [place] with Increased hepatic echogenicity, most likely related to steatosis.  Cholecystectomy, without biliary ductal dilatation. -avoid nsaid -minimize etoh  5.  Hypothyroidism: Continue levothyroxine 112 mcg p.o. daily.  6.  Weight loss: Intentional however concerning given abnormal LFTs.  see above   Code Status: full Family Communication: patient  Disposition Plan: home hopefully tomorrow   Consultants:  [facility]  Procedures:  egd  Antibiotics:    HPI/Subjective: 68 yo admitted UGI bleed likely related to nsaid use and moderate etoh. EGD 7/6 monitor tonite. Likely discharge tomorrow. PPI  Objective: Vitals:   03/10/19 0945 03/10/19 0955  BP: 121/73 122/74  Pulse: 69 69  Resp: 15 16  Temp:    SpO2: 99% 99%    Intake/Output Summary (Last 24 hours) at 03/10/2019 1302 Last data filed at 03/10/2019 1045 Gross per 24 hour  Intake 2869.54 ml  Output 0 ml  Net 2869.54 ml   Filed Weights   03/09/19 1204 03/09/19 1700 03/09/19 2126  Weight: 67.6 kg 65.8 kg 66.5 kg     Exam:   General:  Awake alert no acute distress  Cardiovascular: rrr no mgr no LE edema  Respiratory: normal effort BS clear bilaterally no wheeze  Abdomen: non-distended +BS no guarding or rebounding  Musculoskeletal: joints without swelling/erythema   Data Reviewed: Basic Metabolic Panel: Recent Labs  Lab 03/09/19 1221 03/10/19 0453  NA 133* 139  K 3.9 3.9  CL 96* 109  CO2 23 22  GLUCOSE 122* 96  BUN 27* 11  CREATININE 1.56* 1.21*  CALCIUM 9.2 7.9*   Liver Function Tests: Recent Labs  Lab 03/09/19 1221 03/10/19 0453  AST 101* 68*  ALT 70* 55*  ALKPHOS 95 70  BILITOT 0.7 1.2  PROT 7.1 5.6*  ALBUMIN 4.2 3.2*   No results for input(s): LIPASE, AMYLASE in the last 168 hours. No results for input(s): AMMONIA in the last 168 hours. CBC: Recent Labs  Lab 03/09/19 1221 03/09/19 1812 03/10/19 0453  WBC 6.4  --  4.4  NEUTROABS 3.7  --   --   HGB 13.3 11.6* 11.2*  HCT 39.3 34.9* 33.0*  MCV 102.3*  --  103.4*  PLT 253  --  159   Cardiac Enzymes: No results for input(s): CKTOTAL, CKMB, CKMBINDEX, TROPONINI in the last 168 hours. BNP (last 3 results) No results for input(s): BNP in the last 8760 hours.  ProBNP (last 3 results) No results for input(s): PROBNP in the last 8760 hours.  CBG: No results for input(s): GLUCAP in the last 168 hours.  Recent Results (from the past 240 hour(s))  SARS Coronavirus 2 (CEPHEID -  Performed in [facility] hospital lab), Hosp Order     Status: None   Collection Time: 03/09/19 12:23 PM   Specimen: Nasopharyngeal Swab  Result Value Ref Range Status   SARS Coronavirus 2 NEGATIVE NEGATIVE Final    Comment: (NOTE) If result is NEGATIVE SARS-CoV-2 target nucleic acids are NOT DETECTED. The SARS-CoV-2 RNA is generally detectable in upper and lower  respiratory specimens during the acute phase of infection. The lowest  concentration of SARS-CoV-2 viral copies this assay can detect is 250  copies / mL. A negative result  does not preclude SARS-CoV-2 infection  and should not be used as the sole basis for treatment or other  patient management decisions.  A negative result may occur with  improper specimen collection / handling, submission of specimen other  than nasopharyngeal swab, presence of viral mutation(s) within the  areas targeted by this assay, and inadequate number of viral copies  (<250 copies / mL). A negative result must be combined with clinical  observations, patient history, and epidemiological information. If result is POSITIVE SARS-CoV-2 target nucleic acids are DETECTED. The SARS-CoV-2 RNA is generally detectable in upper and lower  respiratory specimens dur ing the acute phase of infection.  Positive  results are indicative of active infection with SARS-CoV-2.  Clinical  correlation with patient history and other diagnostic information is  necessary to determine patient infection status.  Positive results do  not rule out bacterial infection or co-infection with other viruses. If result is PRESUMPTIVE POSTIVE SARS-CoV-2 nucleic acids MAY BE PRESENT.   A presumptive positive result was obtained on the submitted specimen  and confirmed on repeat testing.  While 2019 novel coronavirus  (SARS-CoV-2) nucleic acids may be present in the submitted sample  additional confirmatory testing may be necessary for epidemiological  and / or clinical management purposes  to differentiate between  SARS-CoV-2 and other Sarbecovirus currently known to infect humans.  If clinically indicated additional testing with an alternate test  methodology [phone]) is advised. The SARS-CoV-2 RNA is generally  detectable in upper and lower respiratory sp ecimens during the acute  phase of infection. The expected result is Negative. Fact Sheet for Patients:  StrictlyIdeas.no Fact Sheet for Healthcare Providers: BankingDealers.co.za This test is not yet approved or  cleared by the [place] FDA and has been authorized for detection and/or diagnosis of SARS-CoV-2 by FDA under an Emergency Use Authorization (EUA).  This EUA will remain in effect (meaning this test can be used) for the duration of the COVID-19 declaration under Section 564(b)(1) of the Act, 21 U.S.C. section 360bbb-3(b)(1), unless the authorization is terminated or revoked sooner. Performed at [facility], [place] [address] [place], [place] [address]      Studies: US Abdomen Limited Ruq  Result Date: 03/09/2019 CLINICAL DATA:  Elevated liver function tests and bloody emesis. EXAM: ULTRASOUND ABDOMEN LIMITED RIGHT UPPER QUADRANT COMPARISON:  None. FINDINGS: Gallbladder: Surgically absent Common bile duct: Diameter: Normal, 3 mm. Liver: Moderately increased echogenicity. Portal vein is patent on color Doppler imaging with normal direction of blood flow towards the liver. IMPRESSION: 1. Increased hepatic echogenicity, most likely related to steatosis. 2.  Cholecystectomy, without biliary ductal dilatation. Electronically Signed   By: Abigail Miyamoto M.D.   On: 03/09/2019 15:57    Scheduled Meds: . clonazePAM  2 mg Oral QHS  . ketotifen  1 drop Both Eyes Daily  . levothyroxine  112 mcg Oral Daily  . metoprolol tartrate  12.5 mg Oral Daily  .  pantoprazole  40 mg Oral Q0600  . sodium chloride flush  3 mL Intravenous Q12H   Continuous Infusions:  Principal Problem:   Hematemesis Active Problems:   Primary hypertension   AKI (acute kidney injury) ([place])   Gastritis and gastroduodenitis   Hypothyroid   Abnormal LFTs (liver function tests)   Steatosis of liver   Weight loss    Time spent: 45 minutes    [place] NP  [facility]  If 7PM-7AM, please contact night-coverage at [email or web address], password [facility] 03/10/2019, 1:02 PM  LOS: 0 days

## 2019-03-10 NOTE — Anesthesia Procedure Notes (Signed)
Procedure Name: MAC Date/Time: 03/10/2019 9:18 AM Performed by: Marsa Aris, CRNA Pre-anesthesia Checklist: Patient identified, Emergency Drugs available, Suction available, Patient being monitored and Timeout performed Patient Re-evaluated:Patient Re-evaluated prior to induction Oxygen Delivery Method: Nasal cannula Preoxygenation: Pre-oxygenation with 100% oxygen Induction Type: IV induction

## 2019-03-11 DIAGNOSIS — K920 Hematemesis: Secondary | ICD-10-CM | POA: Diagnosis not present

## 2019-03-11 LAB — HEPATITIS PANEL, ACUTE
HCV Ab: <0.1 s/co ratio (ref 0.0–0.9)
Hep A IgM: NEGATIVE
Hep B C IgM: NEGATIVE
Hepatitis B Surface Ag: NEGATIVE

## 2019-03-11 LAB — CBC
HCT: 34.2 % — ABNORMAL LOW (ref 36.0–46.0)
Hemoglobin: 11.2 g/dL — ABNORMAL LOW (ref 12.0–15.0)
MCH: 34.8 pg — ABNORMAL HIGH (ref 26.0–34.0)
MCHC: 32.7 g/dL (ref 30.0–36.0)
MCV: 106.2 fL — ABNORMAL HIGH (ref 80.0–100.0)
Platelets: 161 10*3/uL (ref 150–400)
RBC: 3.22 MIL/uL — ABNORMAL LOW (ref 3.87–5.11)
RDW: 13.3 % (ref 11.5–15.5)
WBC: 5.1 10*3/uL (ref 4.0–10.5)
nRBC: 0 % (ref 0.0–0.2)

## 2019-03-11 LAB — HIV ANTIBODY (ROUTINE TESTING W REFLEX): HIV Screen 4th Generation wRfx: NONREACTIVE

## 2019-03-11 MED ORDER — ACETAMINOPHEN 325 MG PO TABS: 650.0000 mg | ORAL_TABLET | Freq: Four times a day (QID) | ORAL | Status: DC | PRN

## 2019-03-11 MED ORDER — PANTOPRAZOLE SODIUM 40 MG PO TBEC: 40.0000 mg | DELAYED_RELEASE_TABLET | Freq: Every day | ORAL | 1 refills | Status: DC

## 2019-03-11 MED FILL — PANTOPRAZOLE SOD DR 40 MG T: 40 | 30 days supply | Qty: 30 | Fill #0

## 2019-03-11 NOTE — Discharge Summary (Addendum)
Physician Discharge Summary  Brittany Washington HWE:[id number] DOB: 1950-09-07 DOA: 03/09/2019  PCP: Marton Redwood, MD  Admit date: 03/09/2019 Discharge date: 03/11/2019  Time spent: 45 minutes  Recommendations for Outpatient Follow-up:  1. Take medications as prescribed 2. No NSAIDS 3. Follow up with GI as needed 4. Follow up with PCP 3-4 weeks. Recommend CMET to track liver function tests   Discharge Diagnoses:  Principal Problem:   Hematemesis Active Problems:   Primary hypertension   AKI (acute kidney injury) ([place])   Gastritis and gastroduodenitis   Hypothyroid   Abnormal LFTs (liver function tests)   Steatosis of liver   Weight loss   Discharge Condition: stable  Diet recommendation: heart healthy  Filed Weights   03/09/19 1204 03/09/19 1700 03/09/19 2126  Weight: 67.6 kg 65.8 kg 66.5 kg    History of present illness:  Brittany Washington is a very pleasant 68 y.o. female with medical history significant of hypertension, hypothyroidism, osteoarthritis, and systolic heart murmur who presented to the emergency department 03/09/19 after having 5 episodes of bloody emesis with clots.  She stated she was in the grocery store with her husband felt a little nauseous felt like she was going to vomit went to the bathroom and vomited 5 times with blood.  Little lightheaded with this episode.  Had not had anything similar to this occur before.  Had a colonoscopy 5 years ago that was negative.  She never had an upper endoscopy.  Endorses a 20 pound weight loss over the past 2 months due mostly to the point where she has retired from working as a Marine scientist and is now eating healthier and walking every day.  She reported that her stool had been brown and not dark in color.  She had some diarrhea that morning but no other symptoms.  On admission feeling fine and  not feel nauseous anymore.  Denied abdominal pain, history of hepatitis, history of prior symptoms, history of alcohol use, prior history of  cirrhosis.  Her only other complaint is that she has bilateral lower extremity cramping which she does not know what it is coming from.  Hospital Course:  1. Hematemesis: in setting of nsaid use and moderate etoh use. No further episodes. EGD today with  no blood in the UGI tract.  Mild to moderate pan-gastritis, biopsied to check for H. Pylori.  No signs of portal hypertension.  Bleeding (overt hematemesis) may have been from an unseen AVM or perhaps gastritis that was noted. Recommends PPI daily. No NSAIDs. Follow up with GI as needed.  2. Acute kidney injury: Creatinine 1.56 on admission. Given gentle fluids and creatinine 1.2.   3. Primary hypertension: Controlled. Home meds include HCTZ, diovan and metoprolol. Resume home meds at discharge  4. Abnormal LFTs: [place] with Increased hepatic echogenicity, most likely related to steatosis. Cholecystectomy, without biliary ductal dilatation. Avoid nsaid -minimize etoh  5. Hypothyroidism: Continue levothyroxine 112 mcg p.o. daily.   Procedures:  EGD as noted above  Consultations:  Dr [facility]  Discharge Exam: Vitals:   03/10/19 2059 03/11/19 0512  BP: 118/66 130/86  Pulse: 63 62  Resp: 18 18  Temp: 98.2 F (36.8 C) 97.9 F (36.6 C)  SpO2: 98% 100%    General: awake alert sitting up in bed watching TV no acute distress Cardiovascular: rrr no mgr no LE edema Respiratory: normal effort BS clear  Discharge Instructions   Discharge Instructions    Call MD for:  extreme fatigue   Complete  by: As directed    Call MD for:  persistant dizziness or light-headedness   Complete by: As directed    Call MD for:  persistant nausea and vomiting   Complete by: As directed    Diet - low sodium heart healthy   Complete by: As directed    Discharge instructions   Complete by: As directed    Follow up with GI as needed No NSAIDs Take meds as prescribed   Increase activity slowly   Complete by: As directed       Allergies as of 03/11/2019      Reactions   Sulfa Antibiotics Diarrhea, Other (See Comments)   Body aches       Medication List    STOP taking these medications   aspirin 325 MG EC tablet   ibuprofen 600 MG tablet Commonly known as: ADVIL   tiZANidine 4 MG tablet Commonly known as: Zanaflex     TAKE these medications   acetaminophen 325 MG tablet Commonly known as: TYLENOL Take 2 tablets (650 mg total) by mouth every 6 (six) hours as needed for mild pain (or Fever >/= 101).   cetirizine 10 MG tablet Commonly known as: ZYRTEC Take 10 mg by mouth at bedtime.   clonazePAM 1 MG tablet Commonly known as: KLONOPIN Take 1 tablet (1 mg total) by mouth at bedtime. Take 1-2 tablets by mouth daily at bedtime as needed for sleep. What changed:   how much to take  additional instructions   diphenhydrAMINE 25 MG tablet Commonly known as: BENADRYL Take 25 mg by mouth at bedtime as needed for allergies.   hydrochlorothiazide 12.5 MG capsule Commonly known as: MICROZIDE TAKE 1 CAPSULE BY MOUTH DAILY.   ketotifen 0.025 % ophthalmic solution Commonly known as: ZADITOR Place 1 drop into both eyes daily.   levothyroxine 112 MCG tablet Commonly known as: SYNTHROID TAKE 1 TABLET BY MOUTH ONCE DAILY What changed: when to take this   methocarbamol 500 MG tablet Commonly known as: ROBAXIN Take 500 mg by mouth at bedtime as needed for muscle spasms.   metoprolol tartrate 25 MG tablet Commonly known as: LOPRESSOR Take 12.5 mg by mouth daily.   pantoprazole 40 MG tablet Commonly known as: PROTONIX Take 1 tablet (40 mg total) by mouth daily at 6 (six) AM. What changed:   when to take this  reasons to take this   valsartan 160 MG tablet Commonly known as: DIOVAN Take 160 mg by mouth daily.   Vitamin D3 10 MCG (400 UNIT) Caps Take 400 Units by mouth daily.      Allergies  Allergen Reactions  . Sulfa Antibiotics Diarrhea and Other (See Comments)    Body aches        The results of significant diagnostics from this hospitalization (including imaging, microbiology, ancillary and laboratory) are listed below for reference.    Significant Diagnostic Studies: US Abdomen Limited Ruq  Result Date: 03/09/2019 CLINICAL DATA:  Elevated liver function tests and bloody emesis. EXAM: ULTRASOUND ABDOMEN LIMITED RIGHT UPPER QUADRANT COMPARISON:  None. FINDINGS: Gallbladder: Surgically absent Common bile duct: Diameter: Normal, 3 mm. Liver: Moderately increased echogenicity. Portal vein is patent on color Doppler imaging with normal direction of blood flow towards the liver. IMPRESSION: 1. Increased hepatic echogenicity, most likely related to steatosis. 2.  Cholecystectomy, without biliary ductal dilatation. Electronically Signed   By: Abigail Miyamoto M.D.   On: 03/09/2019 15:57    Microbiology: Recent Results (from the past 240 hour(s))  SARS  Coronavirus 2 (CEPHEID - Performed in [place] [facility] hospital lab), Hosp Order     Status: None   Collection Time: 03/09/19 12:23 PM   Specimen: Nasopharyngeal Swab  Result Value Ref Range Status   SARS Coronavirus 2 NEGATIVE NEGATIVE Final    Comment: (NOTE) If result is NEGATIVE SARS-CoV-2 target nucleic acids are NOT DETECTED. The SARS-CoV-2 RNA is generally detectable in upper and lower  respiratory specimens during the acute phase of infection. The lowest  concentration of SARS-CoV-2 viral copies this assay can detect is 250  copies / mL. A negative result does not preclude SARS-CoV-2 infection  and should not be used as the sole basis for treatment or other  patient management decisions.  A negative result may occur with  improper specimen collection / handling, submission of specimen other  than nasopharyngeal swab, presence of viral mutation(s) within the  areas targeted by this assay, and inadequate number of viral copies  (<250 copies / mL). A negative result must be combined with clinical  observations, patient  history, and epidemiological information. If result is POSITIVE SARS-CoV-2 target nucleic acids are DETECTED. The SARS-CoV-2 RNA is generally detectable in upper and lower  respiratory specimens dur ing the acute phase of infection.  Positive  results are indicative of active infection with SARS-CoV-2.  Clinical  correlation with patient history and other diagnostic information is  necessary to determine patient infection status.  Positive results do  not rule out bacterial infection or co-infection with other viruses. If result is PRESUMPTIVE POSTIVE SARS-CoV-2 nucleic acids MAY BE PRESENT.   A presumptive positive result was obtained on the submitted specimen  and confirmed on repeat testing.  While 2019 novel coronavirus  (SARS-CoV-2) nucleic acids may be present in the submitted sample  additional confirmatory testing may be necessary for epidemiological  and / or clinical management purposes  to differentiate between  SARS-CoV-2 and other Sarbecovirus currently known to infect humans.  If clinically indicated additional testing with an alternate test  methodology [phone]) is advised. The SARS-CoV-2 RNA is generally  detectable in upper and lower respiratory sp ecimens during the acute  phase of infection. The expected result is Negative. Fact Sheet for Patients:  StrictlyIdeas.no Fact Sheet for Healthcare Providers: BankingDealers.co.za This test is not yet approved or cleared by the [place] FDA and has been authorized for detection and/or diagnosis of SARS-CoV-2 by FDA under an Emergency Use Authorization (EUA).  This EUA will remain in effect (meaning this test can be used) for the duration of the COVID-19 declaration under Section 564(b)(1) of the Act, 21 U.S.C. section 360bbb-3(b)(1), unless the authorization is terminated or revoked sooner. Performed at [facility], [place] [address] [place], [place] [address]       Labs: Basic Metabolic Panel: Recent Labs  Lab 03/09/19 1221 03/10/19 0453  NA 133* 139  K 3.9 3.9  CL 96* 109  CO2 23 22  GLUCOSE 122* 96  BUN 27* 11  CREATININE 1.56* 1.21*  CALCIUM 9.2 7.9*   Liver Function Tests: Recent Labs  Lab 03/09/19 1221 03/10/19 0453  AST 101* 68*  ALT 70* 55*  ALKPHOS 95 70  BILITOT 0.7 1.2  PROT 7.1 5.6*  ALBUMIN 4.2 3.2*   No results for input(s): LIPASE, AMYLASE in the last 168 hours. No results for input(s): AMMONIA in the last 168 hours. CBC: Recent Labs  Lab 03/09/19 1221 03/09/19 1812 03/10/19 0453 03/11/19 0530  WBC 6.4  --  4.4 5.1  NEUTROABS 3.7  --   --   --   HGB 13.3 11.6* 11.2* 11.2*  HCT 39.3 34.9* 33.0* 34.2*  MCV 102.3*  --  103.4* 106.2*  PLT 253  --  159 161   Cardiac Enzymes: No results for input(s): CKTOTAL, CKMB, CKMBINDEX, TROPONINI in the last 168 hours. BNP: BNP (last 3 results) No results for input(s): BNP in the last 8760 hours.  ProBNP (last 3 results) No results for input(s): PROBNP in the last 8760 hours.  CBG: No results for input(s): GLUCAP in the last 168 hours.     SignedRadene Gunning NP  [facility] 03/11/2019, 7:58 AM

## 2019-03-11 NOTE — Progress Notes (Signed)
DISCHARGE NOTE HOME Brittany Washington to be discharged Home per MD order. Discussed prescriptions and follow up appointments with the patient. Prescriptions given to patient; medication list explained in detail. Patient verbalized understanding.  Skin clean, dry and intact without evidence of skin break down, no evidence of skin tears noted. IV catheter discontinued intact. Site without signs and symptoms of complications. Dressing and pressure applied. Pt denies pain at the site currently. No complaints noted.  Patient free of lines, drains, and wounds.   An After Visit Summary (AVS) was printed and given to the patient. Patient escorted via wheelchair, and discharged home via private auto.  Aneta Mins BSN, RN3

## 2019-03-12 DIAGNOSIS — D62 Acute posthemorrhagic anemia: Secondary | ICD-10-CM | POA: Diagnosis not present

## 2019-03-12 DIAGNOSIS — K2970 Gastritis, unspecified, without bleeding: Secondary | ICD-10-CM | POA: Diagnosis not present

## 2019-03-16 MED FILL — clonazePAM 1 MG TABS: 1 | 30 days supply | Qty: 60 | Fill #1

## 2019-03-19 DIAGNOSIS — D62 Acute posthemorrhagic anemia: Secondary | ICD-10-CM | POA: Diagnosis not present

## 2019-03-19 DIAGNOSIS — K2970 Gastritis, unspecified, without bleeding: Secondary | ICD-10-CM | POA: Diagnosis not present

## 2019-03-19 DIAGNOSIS — K760 Fatty (change of) liver, not elsewhere classified: Secondary | ICD-10-CM | POA: Diagnosis not present

## 2019-03-19 DIAGNOSIS — K922 Gastrointestinal hemorrhage, unspecified: Secondary | ICD-10-CM | POA: Diagnosis not present

## 2019-03-19 DIAGNOSIS — R740 Nonspecific elevation of levels of transaminase and lactic acid dehydrogenase [LDH]: Secondary | ICD-10-CM | POA: Diagnosis not present

## 2019-03-24 ENCOUNTER — Other Ambulatory Visit: Payer: Self-pay | Admitting: Internal Medicine

## 2019-03-24 DIAGNOSIS — Z1231 Encounter for screening mammogram for malignant neoplasm of breast: Secondary | ICD-10-CM

## 2019-03-27 MED FILL — METHOCARBAMOL 500 MG TABS: 500 | 30 days supply | Qty: 60 | Fill #1

## 2019-04-03 ENCOUNTER — Encounter (HOSPITAL_COMMUNITY): Payer: Medicare Other

## 2019-04-07 DIAGNOSIS — Z01419 Encounter for gynecological examination (general) (routine) without abnormal findings: Secondary | ICD-10-CM | POA: Diagnosis not present

## 2019-04-09 ENCOUNTER — Other Ambulatory Visit: Payer: Self-pay

## 2019-04-09 ENCOUNTER — Other Ambulatory Visit (HOSPITAL_COMMUNITY): Payer: Self-pay | Admitting: *Deleted

## 2019-04-10 ENCOUNTER — Ambulatory Visit (HOSPITAL_COMMUNITY)
Admission: RE | Admit: 2019-04-10 | Discharge: 2019-04-10 | Disposition: A | Discharge status: Home / Self Care | Payer: Medicare Other | Source: Ambulatory Visit | Attending: Internal Medicine | Admitting: Internal Medicine

## 2019-04-10 DIAGNOSIS — M810 Age-related osteoporosis without current pathological fracture: Secondary | ICD-10-CM | POA: Insufficient documentation

## 2019-04-10 DIAGNOSIS — D62 Acute posthemorrhagic anemia: Secondary | ICD-10-CM | POA: Diagnosis not present

## 2019-04-10 DIAGNOSIS — R740 Nonspecific elevation of levels of transaminase and lactic acid dehydrogenase [LDH]: Secondary | ICD-10-CM | POA: Diagnosis not present

## 2019-04-10 DIAGNOSIS — E7849 Other hyperlipidemia: Secondary | ICD-10-CM | POA: Diagnosis not present

## 2019-04-10 MED ORDER — DENOSUMAB 60 MG/ML ~~LOC~~ SOSY
60.0000 mg | PREFILLED_SYRINGE | Freq: Once | SUBCUTANEOUS | Status: AC
  Administered 2019-04-10: 12:00:00 60 mg via SUBCUTANEOUS

## 2019-04-10 MED ORDER — DENOSUMAB 60 MG/ML ~~LOC~~ SOSY
PREFILLED_SYRINGE | SUBCUTANEOUS | Status: AC
  Filled 2019-04-10: qty 1

## 2019-04-13 DIAGNOSIS — H16201 Unspecified keratoconjunctivitis, right eye: Secondary | ICD-10-CM | POA: Diagnosis not present

## 2019-04-14 DIAGNOSIS — S0501XA Injury of conjunctiva and corneal abrasion without foreign body, right eye, initial encounter: Secondary | ICD-10-CM | POA: Diagnosis not present

## 2019-04-14 MED FILL — clonazePAM 1 MG TABS: 1 | 30 days supply | Qty: 60 | Fill #2

## 2019-04-15 DIAGNOSIS — S0501XD Injury of conjunctiva and corneal abrasion without foreign body, right eye, subsequent encounter: Secondary | ICD-10-CM | POA: Diagnosis not present

## 2019-04-29 MED FILL — METHOCARBAMOL 500 MG TABS: 500 | 30 days supply | Qty: 60 | Fill #2

## 2019-04-29 MED FILL — PANTOPRAZOLE SOD DR 40 MG T: 40 | 30 days supply | Qty: 30 | Fill #1

## 2019-04-30 DIAGNOSIS — S0501XS Injury of conjunctiva and corneal abrasion without foreign body, right eye, sequela: Secondary | ICD-10-CM | POA: Diagnosis not present

## 2019-05-07 ENCOUNTER — Other Ambulatory Visit: Payer: Self-pay

## 2019-05-07 ENCOUNTER — Ambulatory Visit
Admission: RE | Admit: 2019-05-07 | Discharge: 2019-05-07 | Disposition: A | Discharge status: Home / Self Care | Payer: Medicare Other | Source: Ambulatory Visit | Attending: Internal Medicine | Admitting: Internal Medicine

## 2019-05-07 DIAGNOSIS — Z1231 Encounter for screening mammogram for malignant neoplasm of breast: Secondary | ICD-10-CM | POA: Diagnosis not present

## 2019-05-08 MED FILL — LEVOTHYROXINE 112 MCG TAB: 112 | 90 days supply | Qty: 90 | Fill #1

## 2019-05-08 MED FILL — HYDROCHLOROTHIAZIDE 12.5 MG: 12.5 | 90 days supply | Qty: 90 | Fill #1

## 2019-05-14 MED FILL — clonazePAM 1 MG TABS: 1 | 30 days supply | Qty: 60 | Fill #3

## 2019-05-15 DIAGNOSIS — M50322 Other cervical disc degeneration at C5-C6 level: Secondary | ICD-10-CM | POA: Diagnosis not present

## 2019-05-15 DIAGNOSIS — M9902 Segmental and somatic dysfunction of thoracic region: Secondary | ICD-10-CM | POA: Diagnosis not present

## 2019-05-15 DIAGNOSIS — M5134 Other intervertebral disc degeneration, thoracic region: Secondary | ICD-10-CM | POA: Diagnosis not present

## 2019-05-15 DIAGNOSIS — M9901 Segmental and somatic dysfunction of cervical region: Secondary | ICD-10-CM | POA: Diagnosis not present

## 2019-05-26 DIAGNOSIS — M5134 Other intervertebral disc degeneration, thoracic region: Secondary | ICD-10-CM | POA: Diagnosis not present

## 2019-05-26 DIAGNOSIS — M9902 Segmental and somatic dysfunction of thoracic region: Secondary | ICD-10-CM | POA: Diagnosis not present

## 2019-05-26 DIAGNOSIS — M9901 Segmental and somatic dysfunction of cervical region: Secondary | ICD-10-CM | POA: Diagnosis not present

## 2019-05-26 DIAGNOSIS — M50322 Other cervical disc degeneration at C5-C6 level: Secondary | ICD-10-CM | POA: Diagnosis not present

## 2019-05-27 MED FILL — PANTOPRAZOLE SOD DR 40 MG T: 40 | 30 days supply | Qty: 30 | Fill #1

## 2019-05-29 DIAGNOSIS — M5134 Other intervertebral disc degeneration, thoracic region: Secondary | ICD-10-CM | POA: Diagnosis not present

## 2019-05-29 DIAGNOSIS — M9902 Segmental and somatic dysfunction of thoracic region: Secondary | ICD-10-CM | POA: Diagnosis not present

## 2019-05-29 DIAGNOSIS — M50322 Other cervical disc degeneration at C5-C6 level: Secondary | ICD-10-CM | POA: Diagnosis not present

## 2019-05-29 DIAGNOSIS — M9901 Segmental and somatic dysfunction of cervical region: Secondary | ICD-10-CM | POA: Diagnosis not present

## 2019-06-04 DIAGNOSIS — M50322 Other cervical disc degeneration at C5-C6 level: Secondary | ICD-10-CM | POA: Diagnosis not present

## 2019-06-04 DIAGNOSIS — M5134 Other intervertebral disc degeneration, thoracic region: Secondary | ICD-10-CM | POA: Diagnosis not present

## 2019-06-04 DIAGNOSIS — M9901 Segmental and somatic dysfunction of cervical region: Secondary | ICD-10-CM | POA: Diagnosis not present

## 2019-06-04 DIAGNOSIS — M9902 Segmental and somatic dysfunction of thoracic region: Secondary | ICD-10-CM | POA: Diagnosis not present

## 2019-06-04 DIAGNOSIS — K760 Fatty (change of) liver, not elsewhere classified: Secondary | ICD-10-CM | POA: Diagnosis not present

## 2019-06-06 DIAGNOSIS — Z23 Encounter for immunization: Secondary | ICD-10-CM | POA: Diagnosis not present

## 2019-06-09 MED FILL — METHOCARBAMOL 500 MG TABS: 500 | 30 days supply | Qty: 60 | Fill #3

## 2019-06-13 MED FILL — clonazePAM 1 MG TABS: 1 | 30 days supply | Qty: 60 | Fill #4

## 2019-06-20 DIAGNOSIS — Z96641 Presence of right artificial hip joint: Secondary | ICD-10-CM | POA: Diagnosis not present

## 2019-06-24 MED FILL — PANTOPRAZOLE SOD DR 40 MG T: 40 | 30 days supply | Qty: 30 | Fill #2

## 2019-07-09 MED FILL — ONDANSETRON ODT 4 MG TABLET: 4 | 7 days supply | Qty: 30 | Fill #1

## 2019-07-15 MED FILL — clonazePAM 1 MG TABS: 1 | 30 days supply | Qty: 60 | Fill #5

## 2019-07-23 MED FILL — PANTOPRAZOLE SOD DR 40 MG T: 40 | 30 days supply | Qty: 30 | Fill #3

## 2019-08-04 MED FILL — HYDROCHLOROTHIAZIDE 12.5 MG: 12.5 | 90 days supply | Qty: 90 | Fill #2

## 2019-08-14 MED FILL — clonazePAM 1 MG TABS: 1 | 30 days supply | Qty: 60 | Fill #0

## 2019-08-19 MED FILL — PANTOPRAZOLE SOD DR 40 MG T: 40 | 30 days supply | Qty: 30 | Fill #4

## 2019-08-27 ENCOUNTER — Other Ambulatory Visit: Payer: Self-pay | Admitting: Obstetrics and Gynecology

## 2019-08-27 DIAGNOSIS — E2839 Other primary ovarian failure: Secondary | ICD-10-CM

## 2019-09-01 DIAGNOSIS — Z1212 Encounter for screening for malignant neoplasm of rectum: Secondary | ICD-10-CM | POA: Diagnosis not present

## 2019-09-02 ENCOUNTER — Other Ambulatory Visit: Payer: Self-pay

## 2019-09-02 ENCOUNTER — Ambulatory Visit
Admission: RE | Admit: 2019-09-02 | Discharge: 2019-09-02 | Disposition: A | Discharge status: Home / Self Care | Payer: Medicare Other | Source: Ambulatory Visit | Attending: Obstetrics and Gynecology | Admitting: Obstetrics and Gynecology

## 2019-09-02 ENCOUNTER — Other Ambulatory Visit (HOSPITAL_COMMUNITY): Payer: Self-pay | Admitting: *Deleted

## 2019-09-02 DIAGNOSIS — E2839 Other primary ovarian failure: Secondary | ICD-10-CM

## 2019-09-03 ENCOUNTER — Ambulatory Visit (HOSPITAL_COMMUNITY)
Admission: RE | Admit: 2019-09-03 | Discharge: 2019-09-03 | Disposition: A | Discharge status: Home / Self Care | Payer: Medicare Other | Source: Ambulatory Visit | Attending: Internal Medicine | Admitting: Internal Medicine

## 2019-09-03 DIAGNOSIS — M810 Age-related osteoporosis without current pathological fracture: Secondary | ICD-10-CM | POA: Insufficient documentation

## 2019-09-03 MED ORDER — DENOSUMAB 60 MG/ML ~~LOC~~ SOSY
PREFILLED_SYRINGE | SUBCUTANEOUS | Status: AC
  Administered 2019-09-03: 60 mg via SUBCUTANEOUS
  Filled 2019-09-03: qty 1

## 2019-09-03 MED ORDER — DENOSUMAB 60 MG/ML ~~LOC~~ SOSY: 60.0000 mg | PREFILLED_SYRINGE | Freq: Once | SUBCUTANEOUS | Status: AC

## 2019-09-18 DIAGNOSIS — R7989 Other specified abnormal findings of blood chemistry: Secondary | ICD-10-CM | POA: Diagnosis not present

## 2019-10-01 ENCOUNTER — Ambulatory Visit: Payer: Medicare Other

## 2019-10-08 DIAGNOSIS — E781 Pure hyperglyceridemia: Secondary | ICD-10-CM | POA: Diagnosis not present

## 2019-10-10 ENCOUNTER — Ambulatory Visit: Payer: Medicare Other | Attending: Internal Medicine

## 2019-10-10 DIAGNOSIS — Z23 Encounter for immunization: Secondary | ICD-10-CM

## 2019-10-10 NOTE — Progress Notes (Signed)
   Covid-19 Vaccination Clinic  Name:  Brittany Washington    MRN: [id number] DOB: 1951-05-28  10/10/2019  [name] was observed post Covid-19 immunization for 15 minutes without incidence. She was provided with Vaccine Information Sheet and instruction to access the V-Safe system.   [name] was instructed to call 911 with any severe reactions post vaccine: Marland Kitchen Difficulty breathing  . Swelling of your face and throat  . A fast heartbeat  . A bad rash all over your body  . Dizziness and weakness    Immunizations Administered    Name Date Dose VIS Date Route   Pfizer COVID-19 Vaccine 10/10/2019 10:04 AM 0.3 mL 08/15/2019 Intramuscular   Manufacturer: [place]   Lot: [id number]   [place]: [id number]

## 2019-10-22 ENCOUNTER — Ambulatory Visit: Payer: Medicare Other

## 2019-10-30 DIAGNOSIS — L570 Actinic keratosis: Secondary | ICD-10-CM | POA: Diagnosis not present

## 2019-11-04 ENCOUNTER — Ambulatory Visit: Payer: Medicare Other | Attending: Internal Medicine

## 2019-11-04 DIAGNOSIS — Z23 Encounter for immunization: Secondary | ICD-10-CM | POA: Insufficient documentation

## 2019-11-04 NOTE — Progress Notes (Signed)
   Covid-19 Vaccination Clinic  Name:  DEAVON SANFORD    MRN: [id number] DOB: 06-May-1951  11/04/2019  Ms. Petitjean was observed post Covid-19 immunization for 15 minutes without incident. She was provided with Vaccine Information Sheet and instruction to access the V-Safe system.   Ms. Chue was instructed to call 911 with any severe reactions post vaccine: Marland Kitchen Difficulty breathing  . Swelling of face and throat  . A fast heartbeat  . A bad rash all over body  . Dizziness and weakness   Immunizations Administered    Name Date Dose VIS Date Route   Pfizer COVID-19 Vaccine 11/04/2019 11:10 AM 0.3 mL 08/15/2019 Intramuscular   Manufacturer: [place]   Lot: [id number]   [place]: [id number]

## 2019-11-06 DIAGNOSIS — H52203 Unspecified astigmatism, bilateral: Secondary | ICD-10-CM | POA: Diagnosis not present

## 2019-11-06 DIAGNOSIS — H25013 Cortical age-related cataract, bilateral: Secondary | ICD-10-CM | POA: Diagnosis not present

## 2019-11-06 DIAGNOSIS — H2513 Age-related nuclear cataract, bilateral: Secondary | ICD-10-CM | POA: Diagnosis not present

## 2019-11-06 DIAGNOSIS — H43813 Vitreous degeneration, bilateral: Secondary | ICD-10-CM | POA: Diagnosis not present

## 2019-12-29 DIAGNOSIS — J029 Acute pharyngitis, unspecified: Secondary | ICD-10-CM | POA: Diagnosis not present

## 2020-01-11 DIAGNOSIS — G8911 Acute pain due to trauma: Secondary | ICD-10-CM | POA: Diagnosis not present

## 2020-01-11 DIAGNOSIS — S01312A Laceration without foreign body of left ear, initial encounter: Secondary | ICD-10-CM | POA: Diagnosis not present

## 2020-03-24 DIAGNOSIS — S42025A Nondisplaced fracture of shaft of left clavicle, initial encounter for closed fracture: Secondary | ICD-10-CM | POA: Diagnosis not present

## 2020-03-26 ENCOUNTER — Encounter (HOSPITAL_COMMUNITY): Payer: Medicare Other

## 2020-04-07 DIAGNOSIS — S42025D Nondisplaced fracture of shaft of left clavicle, subsequent encounter for fracture with routine healing: Secondary | ICD-10-CM | POA: Diagnosis not present

## 2020-04-21 ENCOUNTER — Other Ambulatory Visit (HOSPITAL_COMMUNITY): Payer: Self-pay | Admitting: *Deleted

## 2020-04-22 ENCOUNTER — Other Ambulatory Visit: Payer: Self-pay

## 2020-04-22 ENCOUNTER — Ambulatory Visit (HOSPITAL_COMMUNITY)
Admission: RE | Admit: 2020-04-22 | Discharge: 2020-04-22 | Disposition: A | Discharge status: Home / Self Care | Payer: Medicare Other | Source: Ambulatory Visit | Attending: Internal Medicine | Admitting: Internal Medicine

## 2020-04-22 DIAGNOSIS — M810 Age-related osteoporosis without current pathological fracture: Secondary | ICD-10-CM | POA: Diagnosis not present

## 2020-04-22 MED ORDER — DENOSUMAB 60 MG/ML ~~LOC~~ SOSY
PREFILLED_SYRINGE | SUBCUTANEOUS | Status: AC
  Administered 2020-04-22: 60 mg
  Filled 2020-04-22: qty 1

## 2020-04-22 MED ORDER — DENOSUMAB 60 MG/ML ~~LOC~~ SOSY: 60.0000 mg | PREFILLED_SYRINGE | Freq: Once | SUBCUTANEOUS | Status: DC

## 2020-04-28 DIAGNOSIS — S42025D Nondisplaced fracture of shaft of left clavicle, subsequent encounter for fracture with routine healing: Secondary | ICD-10-CM | POA: Diagnosis not present

## 2020-05-26 DIAGNOSIS — S42025D Nondisplaced fracture of shaft of left clavicle, subsequent encounter for fracture with routine healing: Secondary | ICD-10-CM | POA: Diagnosis not present

## 2020-05-26 DIAGNOSIS — Z471 Aftercare following joint replacement surgery: Secondary | ICD-10-CM | POA: Diagnosis not present

## 2020-05-26 DIAGNOSIS — Z96641 Presence of right artificial hip joint: Secondary | ICD-10-CM | POA: Diagnosis not present

## 2020-05-30 IMAGING — US ULTRASOUND ABDOMEN LIMITED
1 series · 14 of 25 positions shown · non-contrast
Comparison: None.

CLINICAL DATA: Elevated liver function tests and bloody emesis.

EXAM:
ULTRASOUND ABDOMEN LIMITED RIGHT UPPER QUADRANT

[Series 1: ultrasound abdomen limited · 14 of 37 slices shown]
[im 1/37]
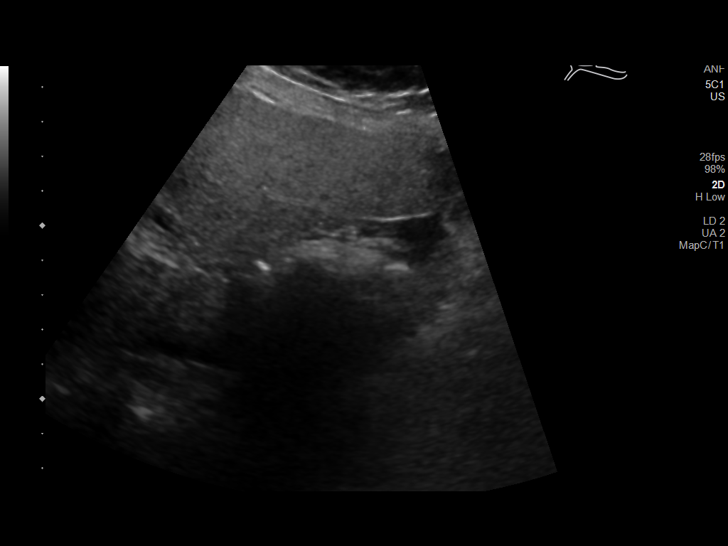
[im 4/37]
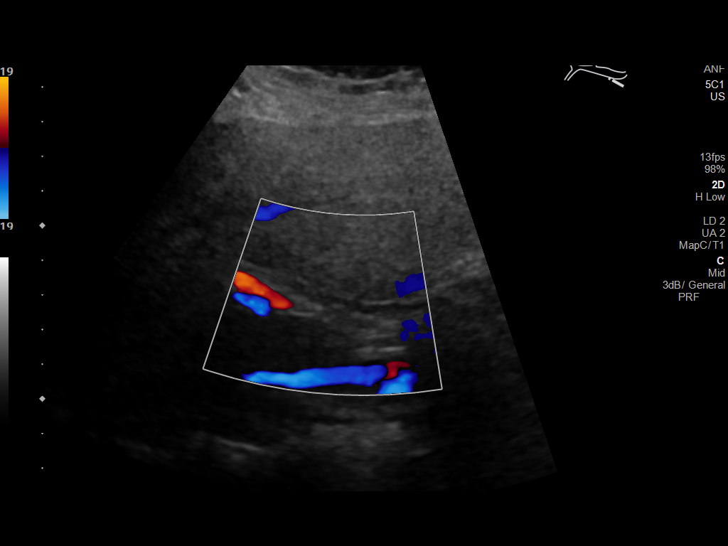
[im 7/37]
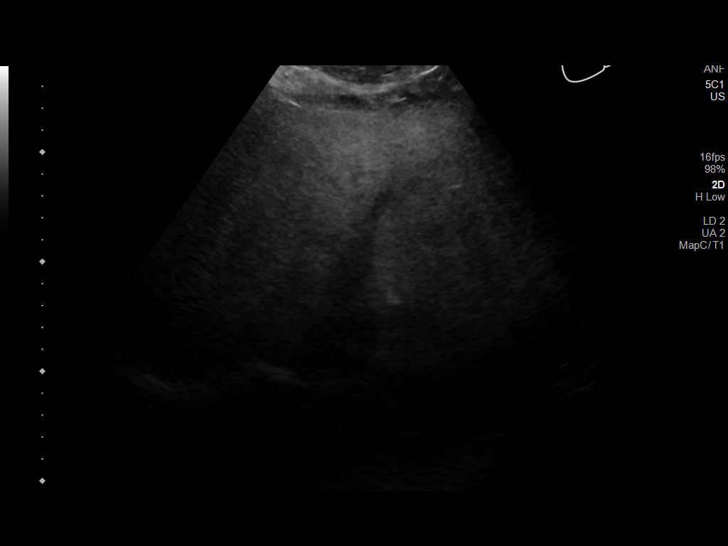
[im 10/37]
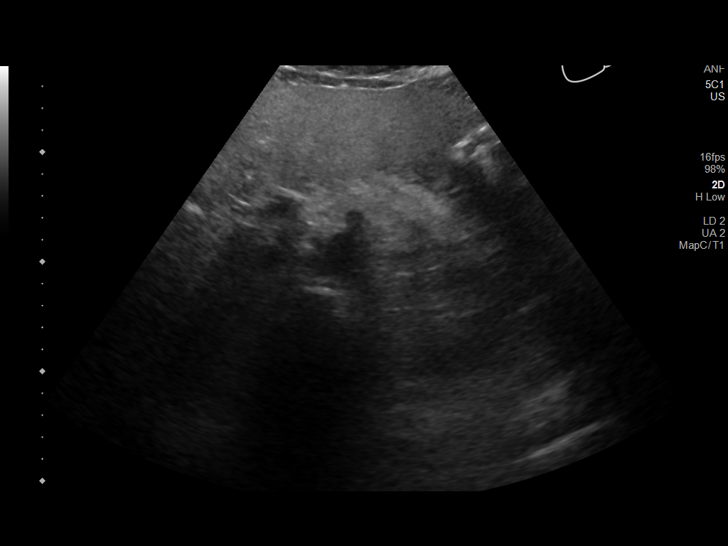
[im 13/37]
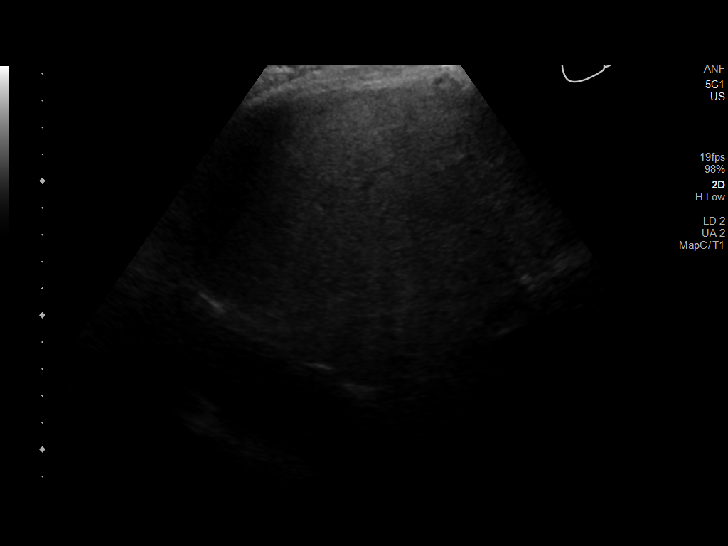
[im 14/37]
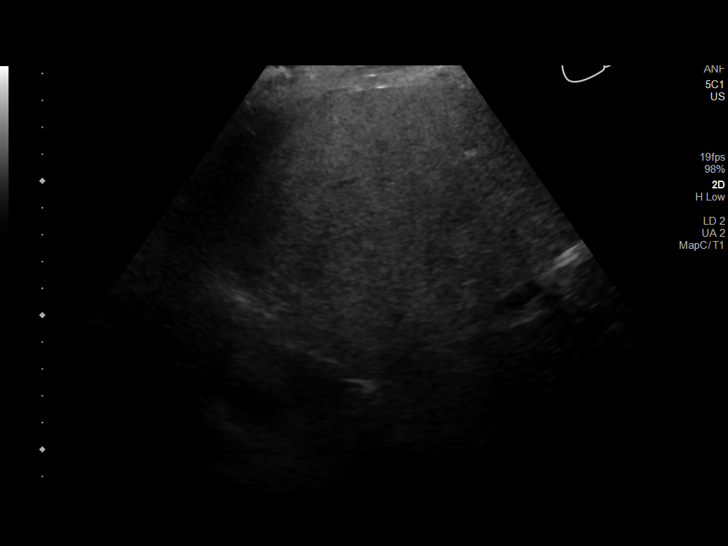
[im 17/37]
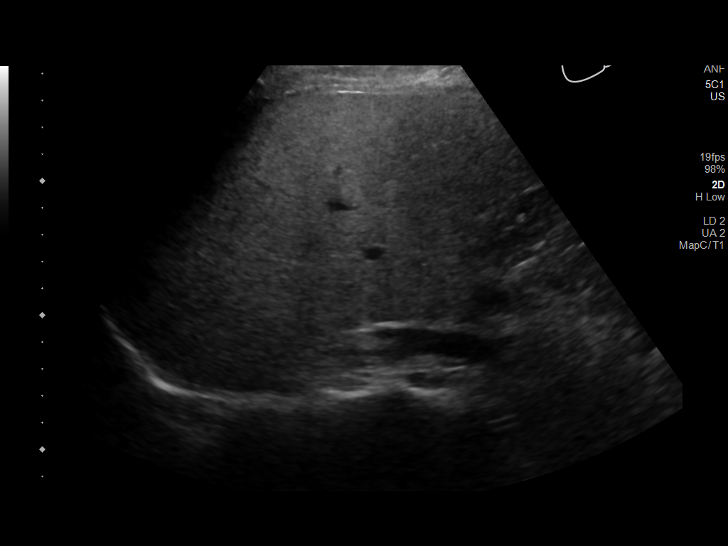
[im 20/37]
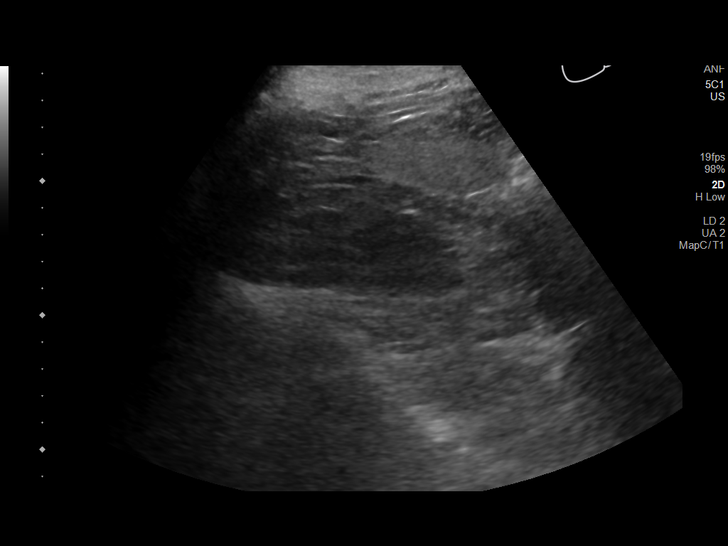
[im 23/37]
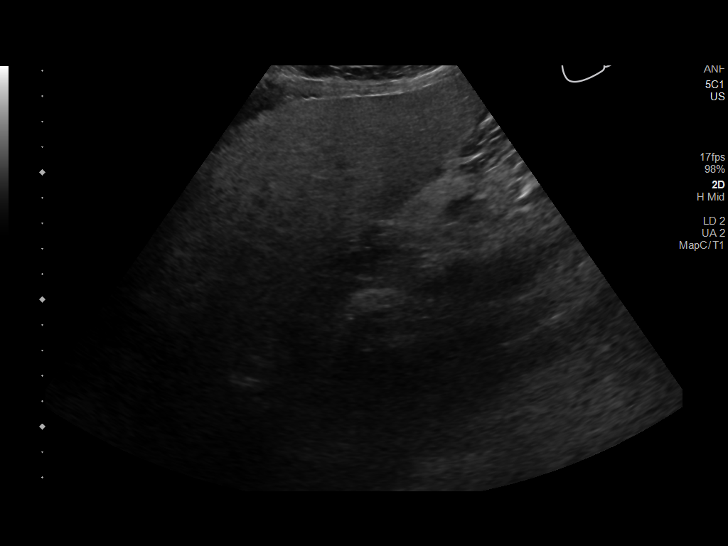
[im 25/37]
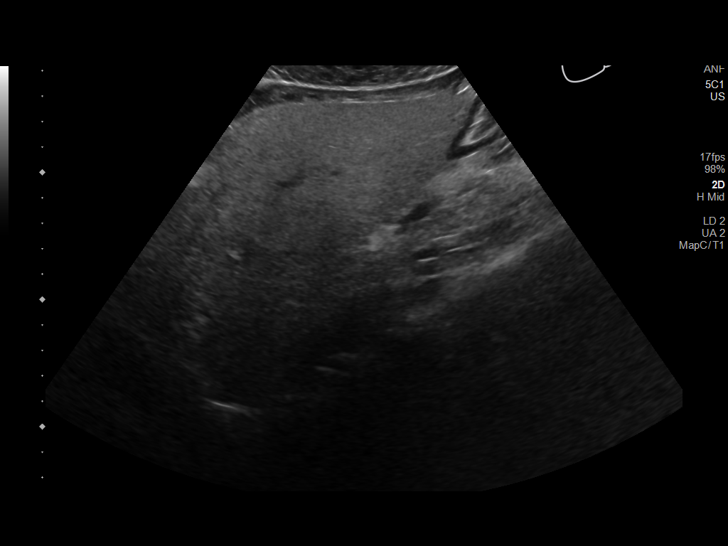
[im 28/37]
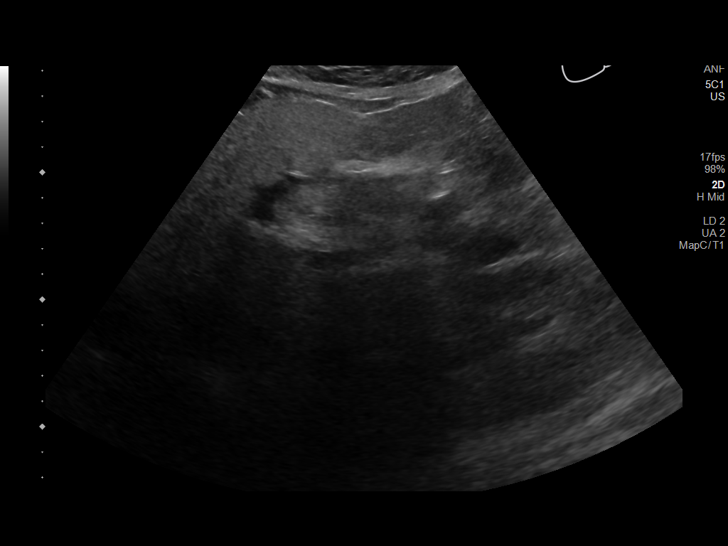
[im 31/37]
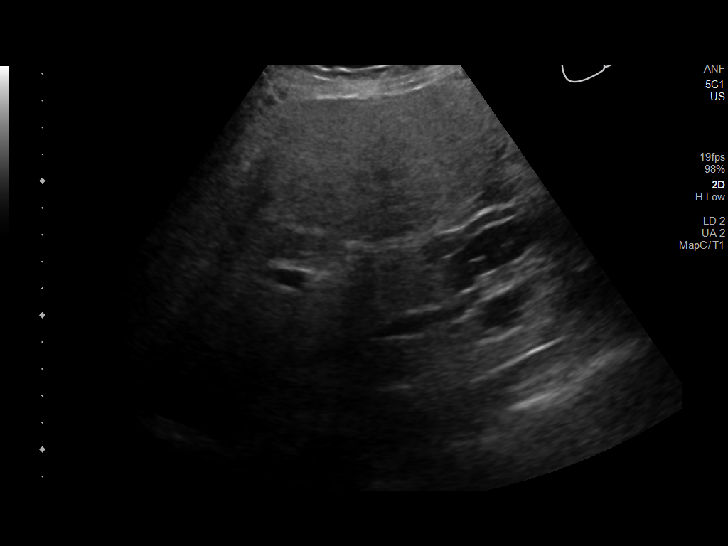
[im 34/37]
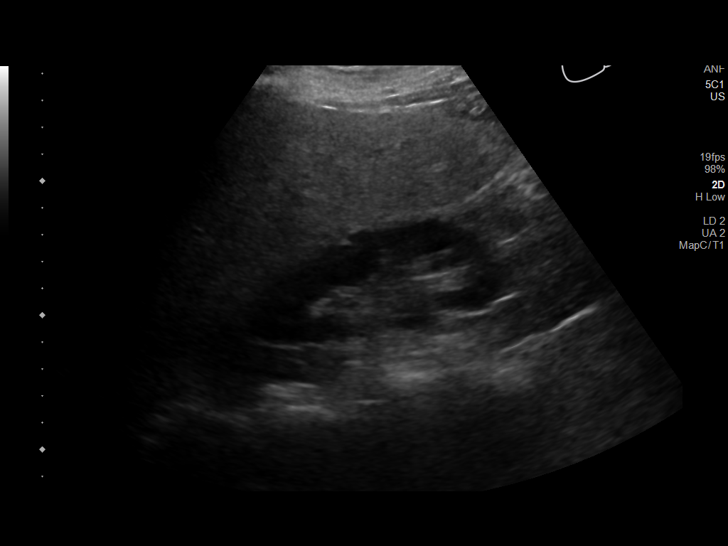
[im 37/37]
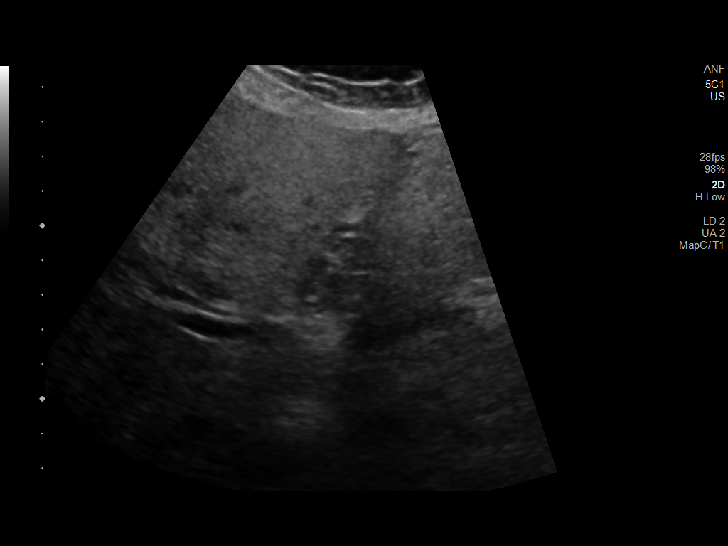

[14 of 25 positions shown; findings below may reference images not displayed]

FINDINGS: Gallbladder:

Surgically absent

Common bile duct:

Diameter: Normal, 3 mm.

Liver:

Moderately increased echogenicity. Portal vein is patent on color
Doppler imaging with normal direction of blood flow towards the
liver.
IMPRESSION: 1. Increased hepatic echogenicity, most likely related to steatosis.
2.  Cholecystectomy, without biliary ductal dilatation.

## 2020-05-31 DIAGNOSIS — Z23 Encounter for immunization: Secondary | ICD-10-CM | POA: Diagnosis not present

## 2020-06-07 ENCOUNTER — Other Ambulatory Visit: Payer: Self-pay | Admitting: Internal Medicine

## 2020-06-07 DIAGNOSIS — Z1231 Encounter for screening mammogram for malignant neoplasm of breast: Secondary | ICD-10-CM

## 2020-06-08 ENCOUNTER — Ambulatory Visit: Payer: Medicare Other | Attending: Internal Medicine

## 2020-06-08 DIAGNOSIS — Z23 Encounter for immunization: Secondary | ICD-10-CM

## 2020-06-08 NOTE — Progress Notes (Signed)
   Covid-19 Vaccination Clinic  Name:  [name]    MRN: [id number] DOB: 01-26-1951  06/08/2020  Brittany Washington was observed post Covid-19 immunization for 15 minutes without incident. She was provided with Vaccine Information Sheet and instruction to access the V-Safe system.   Ms. Grode was instructed to call 911 with any severe reactions post vaccine: Marland Kitchen Difficulty breathing  . Swelling of face and throat  . A fast heartbeat  . A bad rash all over body  . Dizziness and weakness

## 2020-06-10 DIAGNOSIS — R531 Weakness: Secondary | ICD-10-CM | POA: Diagnosis not present

## 2020-06-24 ENCOUNTER — Ambulatory Visit: Payer: Medicare Other

## 2020-06-24 DIAGNOSIS — R531 Weakness: Secondary | ICD-10-CM | POA: Diagnosis not present

## 2020-07-08 DIAGNOSIS — R531 Weakness: Secondary | ICD-10-CM | POA: Diagnosis not present

## 2020-07-26 ENCOUNTER — Other Ambulatory Visit: Payer: Self-pay

## 2020-07-26 ENCOUNTER — Ambulatory Visit
Admission: RE | Admit: 2020-07-26 | Discharge: 2020-07-26 | Disposition: A | Discharge status: Home / Self Care | Payer: Medicare Other | Source: Ambulatory Visit | Attending: Internal Medicine | Admitting: Internal Medicine

## 2020-07-26 DIAGNOSIS — Z1231 Encounter for screening mammogram for malignant neoplasm of breast: Secondary | ICD-10-CM

## 2020-08-31 DIAGNOSIS — M859 Disorder of bone density and structure, unspecified: Secondary | ICD-10-CM | POA: Diagnosis not present

## 2020-08-31 DIAGNOSIS — E039 Hypothyroidism, unspecified: Secondary | ICD-10-CM | POA: Diagnosis not present

## 2020-08-31 DIAGNOSIS — E781 Pure hyperglyceridemia: Secondary | ICD-10-CM | POA: Diagnosis not present

## 2020-09-06 DIAGNOSIS — Z1212 Encounter for screening for malignant neoplasm of rectum: Secondary | ICD-10-CM | POA: Diagnosis not present

## 2020-09-06 DIAGNOSIS — Z0000 Encounter for general adult medical examination without abnormal findings: Secondary | ICD-10-CM | POA: Diagnosis not present

## 2020-09-06 DIAGNOSIS — M545 Low back pain, unspecified: Secondary | ICD-10-CM | POA: Diagnosis not present

## 2020-09-06 DIAGNOSIS — Z1339 Encounter for screening examination for other mental health and behavioral disorders: Secondary | ICD-10-CM | POA: Diagnosis not present

## 2020-09-06 DIAGNOSIS — R82998 Other abnormal findings in urine: Secondary | ICD-10-CM | POA: Diagnosis not present

## 2020-09-06 DIAGNOSIS — Z1331 Encounter for screening for depression: Secondary | ICD-10-CM | POA: Diagnosis not present

## 2020-09-06 DIAGNOSIS — I340 Nonrheumatic mitral (valve) insufficiency: Secondary | ICD-10-CM | POA: Diagnosis not present

## 2020-09-06 DIAGNOSIS — E781 Pure hyperglyceridemia: Secondary | ICD-10-CM | POA: Diagnosis not present

## 2020-09-06 DIAGNOSIS — E785 Hyperlipidemia, unspecified: Secondary | ICD-10-CM | POA: Diagnosis not present

## 2020-09-06 DIAGNOSIS — E039 Hypothyroidism, unspecified: Secondary | ICD-10-CM | POA: Diagnosis not present

## 2020-09-06 DIAGNOSIS — M810 Age-related osteoporosis without current pathological fracture: Secondary | ICD-10-CM | POA: Diagnosis not present

## 2020-09-06 DIAGNOSIS — I10 Essential (primary) hypertension: Secondary | ICD-10-CM | POA: Diagnosis not present

## 2020-09-06 DIAGNOSIS — K760 Fatty (change of) liver, not elsewhere classified: Secondary | ICD-10-CM | POA: Diagnosis not present

## 2020-09-06 DIAGNOSIS — R7401 Elevation of levels of liver transaminase levels: Secondary | ICD-10-CM | POA: Diagnosis not present

## 2020-09-10 ENCOUNTER — Other Ambulatory Visit (HOSPITAL_COMMUNITY): Payer: Self-pay | Admitting: *Deleted

## 2020-09-13 ENCOUNTER — Ambulatory Visit (HOSPITAL_COMMUNITY)
Admission: RE | Admit: 2020-09-13 | Discharge: 2020-09-13 | Disposition: A | Discharge status: Home / Self Care | Payer: Medicare Other | Source: Ambulatory Visit | Attending: Internal Medicine | Admitting: Internal Medicine

## 2020-09-13 ENCOUNTER — Other Ambulatory Visit: Payer: Self-pay

## 2020-09-13 DIAGNOSIS — M810 Age-related osteoporosis without current pathological fracture: Secondary | ICD-10-CM | POA: Diagnosis not present

## 2020-09-13 MED ORDER — DENOSUMAB 60 MG/ML ~~LOC~~ SOSY
PREFILLED_SYRINGE | SUBCUTANEOUS | Status: AC
  Filled 2020-09-13: qty 1

## 2020-09-13 MED ORDER — DENOSUMAB 60 MG/ML ~~LOC~~ SOSY
60.0000 mg | PREFILLED_SYRINGE | Freq: Once | SUBCUTANEOUS | Status: AC
  Administered 2020-09-13: 60 mg via SUBCUTANEOUS

## 2020-11-08 ENCOUNTER — Other Ambulatory Visit (HOSPITAL_COMMUNITY): Payer: Self-pay | Admitting: Internal Medicine

## 2020-11-08 ENCOUNTER — Ambulatory Visit (INDEPENDENT_AMBULATORY_CARE_PROVIDER_SITE_OTHER): Payer: Medicare Other | Admitting: Internal Medicine

## 2020-11-08 ENCOUNTER — Encounter: Payer: Self-pay | Admitting: Internal Medicine

## 2020-11-08 VITALS — BP 120/78 | HR 54 | Ht 63.5 in | Wt 155.0 lb

## 2020-11-08 DIAGNOSIS — Z800 Family history of malignant neoplasm of digestive organs: Secondary | ICD-10-CM | POA: Diagnosis not present

## 2020-11-08 DIAGNOSIS — R195 Other fecal abnormalities: Secondary | ICD-10-CM | POA: Diagnosis not present

## 2020-11-08 MED ORDER — SUTAB 1479-225-188 MG PO TABS: ORAL_TABLET | ORAL | 0 refills | Status: DC

## 2020-11-08 MED FILL — SUTAB 1479-225-188 MG TABS: 1479-225-18 | 1 days supply | Qty: 24 | Fill #0

## 2020-11-08 NOTE — Progress Notes (Signed)
HISTORY OF PRESENT ILLNESS:  Brittany Washington is a 70 y.o. 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 Pearlie Oyster, to discuss repeat screening colonoscopy.  Patient was last seen in the hospital March 10, 2019 for hematemesis.  She was noted to have gastritis but no active bleeding.  This after NSAIDs.  Last seen by myself October 2015 for high risk reading colonoscopy due to a family history of colon cancer in her father at age 60.  That examination as well as prior examinations in 2007 and 2001 were normal.  She presents today with concerns regarding her follow-up interval for colonoscopy which was set at 10 years after her last examination.  I reviewed with her current guidelines.  She does have occasional bloating and loose stools GI review of systems is otherwise negative.  She is interested in follow-up at this time since it has been more than 5 years since her last examination.  REVIEW OF SYSTEMS:  All non-GI ROS negative unless otherwise stated in the HPI except for back pain  Past Medical History:  Diagnosis Date  . Benign hypertensive heart disease without heart failure   . Heart murmur, systolic    MECHNICAL  . Hypertension    mild, lost weight, no meds now  . Hypothyroidism   . Osteoporosis   . Primary osteoarthritis of right hip 06/18/2018    Past Surgical History:  Procedure Laterality Date  . APPENDECTOMY    . BACK SURGERY    . BIOPSY  03/10/2019   Procedure: BIOPSY;  Surgeon: Milus Banister, MD;  Location[facility] ENDOSCOPY;  Service: Endoscopy;;  . COLONOSCOPY    . ESOPHAGOGASTRODUODENOSCOPY N/A 03/10/2019   Procedure: ESOPHAGOGASTRODUODENOSCOPY (EGD);  Surgeon: Milus Banister, MD;  Location: [facility] ENDOSCOPY;  Service: Endoscopy;  Laterality: N/A;  . FRACTURE SURGERY    . HAMMER TOE SURGERY Left 07/14/2013   Procedure: HAMMER TOE CORRECTION LEFT SECOND TOE, AUSTIN/AKIN LEFT;  Surgeon: [name], DPM;   Location: [place];  Service: Podiatry;  Laterality: Left;  . JOINT REPLACEMENT    . LAPAROSCOPIC APPENDECTOMY N/A 12/29/2016   Procedure: APPENDECTOMY LAPAROSCOPIC;  Surgeon: Excell Seltzer, MD;  Location: [facility];  Service: General;  Laterality: N/A;  . [place]  . LUMBAR DISKECTOMY W/ DISSECTION, LEFT L4 -- L5  02-23-2004  . METATARSAL OSTEOTOMY Left 04/02/2015   Procedure: LEFT FOOT METATARSAL OSTEOTOMY;  Surgeon: [name], MD;  Location: [place];  Service: Orthopedics;  Laterality: Left;  . METATARSAL OSTEOTOMY Left 08/18/2016   Procedure: METATARSAL OSTEOTOMY SECOND TOE;  Surgeon: [name], MD;  Location: [place];  Service: Orthopedics;  Laterality: Left;  . ORIF LEFT DISTAL RADIAL FX W/ BONE GRAFT  01-14-2009  . POSTERIOR LUMBAR FUSION  05-16-2005   L4 -- L5  . RE-DO LUMBAR DISKECTOMY , L4  - L5  05-03-2004  . TOTAL HIP ARTHROPLASTY Right 06/18/2018  . TOTAL HIP ARTHROPLASTY Right 06/18/2018   Procedure: RIGHT TOTAL HIP ARTHROPLASTY ANTERIOR APPROACH;  Surgeon: [name], MD;  Location: [place];  Service: Orthopedics;  Laterality: Right;  . TRIGGER FINGER RELEASE  01/09/2012   Procedure: RELEASE TRIGGER FINGER/A-1 PULLEY;  Surgeon: [name], MD;  Location: [place];  Service: Orthopedics;  Laterality: Left;  [place] VAGINAL HYSTERECTOMY  1993   W/ BILATERAL SALPINGOOPHORECTOMY  . WRIST FRACTURE SURGERY Left     Social History Reagan  H Valenza  reports that she has never smoked. She has never used smokeless tobacco. She reports current alcohol use of about 7.0 standard drinks of alcohol per week. She reports that she does not use drugs.  family history includes Colon cancer in her father; Heart attack in her brother and father; Ovarian cancer in her mother.  Allergies  Allergen Reactions  . Sulfa Antibiotics Diarrhea and Other (See Comments)    Body aches         PHYSICAL EXAMINATION: Vital signs: BP 120/78   Pulse (!) 54   Ht 5' 3.5" (1.613 m)   Wt 155 lb (70.3 kg)   SpO2 95%   BMI 27.03 kg/m   Constitutional: generally well-appearing, no acute distress Psychiatric: alert and oriented x3, cooperative Eyes: extraocular movements intact, anicteric, conjunctiva pink Mouth: Mask Neck: supple no lymphadenopathy Cardiovascular: heart regular rate and rhythm, no murmur Lungs: clear to auscultation bilaterally Abdomen: soft, nontender, nondistended, no obvious ascites, no peritoneal signs, normal bowel sounds, no organomegaly Rectal: Deferred until colonoscopy Extremities: no clubbing, cyanosis, or lower extremity edema bilaterally Skin: no lesions on visible extremities Neuro: No focal deficits.  Cranial nerves intact  ASSESSMENT:  1.  Family history of colon cancer in her father at age 66. 2.  Occasional loose stools 3.  Normal colonoscopy 2001, 2007, 2015   PLAN:  1.  Repeat screening colonoscopy, per patient wish.  This is reasonable given her family history.The nature of the procedure, as well as the risks, benefits, and alternatives were carefully and thoroughly reviewed with the patient. Ample time for discussion and questions allowed. The patient understood, was satisfied, and agreed to proceed.

## 2020-11-08 NOTE — Patient Instructions (Signed)
You have been scheduled for a colonoscopy. Please follow written instructions given to you at your visit today.  Please pick up your prep supplies at the pharmacy within the next 1-3 days. If you use inhalers (even only as needed), please bring them with you on the day of your procedure.  If you are age 69 or older, your body mass index should be between 23-30. Your Body mass index is 27.03 kg/m. If this is out of the aforementioned range listed, please consider follow up with your Primary Care Provider.  Due to recent changes in healthcare laws, you may see the results of your imaging and laboratory studies on MyChart before your provider has had a chance to review them.  We understand that in some cases there may be results that are confusing or concerning to you. Not all laboratory results come back in the same time frame and the provider may be waiting for multiple results in order to interpret others.  Please give [place] 48 hours in order for your provider to thoroughly review all the results before contacting the office for clarification of your results.

## 2020-12-09 DIAGNOSIS — L308 Other specified dermatitis: Secondary | ICD-10-CM | POA: Diagnosis not present

## 2020-12-21 ENCOUNTER — Encounter: Payer: Medicare Other | Admitting: Internal Medicine

## 2020-12-31 DIAGNOSIS — E785 Hyperlipidemia, unspecified: Secondary | ICD-10-CM | POA: Diagnosis not present

## 2021-03-01 ENCOUNTER — Other Ambulatory Visit: Payer: Self-pay

## 2021-03-01 ENCOUNTER — Encounter: Payer: Self-pay | Admitting: Internal Medicine

## 2021-03-01 ENCOUNTER — Ambulatory Visit (AMBULATORY_SURGERY_CENTER): Payer: Medicare Other | Admitting: Internal Medicine

## 2021-03-01 VITALS — BP 114/68 | HR 60 | Temp 97.7°F | Resp 14 | Ht 63.5 in | Wt 155.0 lb

## 2021-03-01 DIAGNOSIS — I10 Essential (primary) hypertension: Secondary | ICD-10-CM | POA: Diagnosis not present

## 2021-03-01 DIAGNOSIS — K219 Gastro-esophageal reflux disease without esophagitis: Secondary | ICD-10-CM | POA: Diagnosis not present

## 2021-03-01 DIAGNOSIS — Z1211 Encounter for screening for malignant neoplasm of colon: Secondary | ICD-10-CM | POA: Diagnosis not present

## 2021-03-01 DIAGNOSIS — Z800 Family history of malignant neoplasm of digestive organs: Secondary | ICD-10-CM | POA: Diagnosis not present

## 2021-03-01 DIAGNOSIS — D122 Benign neoplasm of ascending colon: Secondary | ICD-10-CM

## 2021-03-01 MED ORDER — SODIUM CHLORIDE 0.9 % IV SOLN: 500.0000 mL | Freq: Once | INTRAVENOUS | Status: DC

## 2021-03-01 NOTE — Op Note (Signed)
[facility] Patient Name: Brittany Washington Procedure Date: 03/01/2021 10:51 AM MRN: [id number] Endoscopist: Docia Chuck. Henrene Pastor , MD Age: 70 Referring MD:  Date of Birth: 02/11/1951 Gender: Female Account #: [id number] Procedure:                Colonoscopy with cold snare polypectomy x 1 Indications:              Screening for colorectal malignant neoplasm. Father                            with colon cancer greater than age 21. Previous                            examinations 2001, 2007, 2015 all negative for                            neoplasia Medicines:                Monitored Anesthesia Care Procedure:                Pre-Anesthesia Assessment:                           - Prior to the procedure, a History and Physical                            was performed, and patient medications and                            allergies were reviewed. The patient's tolerance of                            previous anesthesia was also reviewed. The risks                            and benefits of the procedure and the sedation                            options and risks were discussed with the patient.                            All questions were answered, and informed consent                            was obtained. Prior Anticoagulants: The patient has                            taken no previous anticoagulant or antiplatelet                            agents. ASA Grade Assessment: II - A patient with                            mild systemic disease. After reviewing the risks  and benefits, the patient was deemed in                            satisfactory condition to undergo the procedure.                           After obtaining informed consent, the colonoscope                            was passed under direct vision. Throughout the                            procedure, the patient's blood pressure, pulse, and                            oxygen saturations  were monitored continuously. The                            CF [id number] #[id number] was introduced through the anus                            and advanced to the the cecum, identified by                            appendiceal orifice and ileocecal valve. The                            terminal ileum, ileocecal valve, appendiceal                            orifice, and rectum were photographed. The quality                            of the bowel preparation was excellent. The                            colonoscopy was performed without difficulty. The                            patient tolerated the procedure well. The bowel                            preparation used was SUPREP via split dose                            instruction. Scope In: 11:04:07 AM Scope Out: 11:17:24 AM Scope Withdrawal Time: 0 hours 8 minutes 49 seconds  Total Procedure Duration: 0 hours 13 minutes 17 seconds  Findings:                 The terminal ileum appeared normal.                           A 3 mm polyp was found in the ascending colon. The  polyp was sessile. The polyp was removed with a                            cold snare. Resection and retrieval were complete.                           The exam was otherwise without abnormality on                            direct and retroflexion views. Complications:            No immediate complications. Estimated blood loss:                            None. Estimated Blood Loss:     Estimated blood loss: none. Impression:               - One 3 mm polyp in the ascending colon, removed                            with a cold snare. Resected and retrieved.                           - The examination was otherwise normal on direct                            and retroflexion views. The terminal ileum was                            normal. Recommendation:           - Repeat colonoscopy in 7 years for surveillance.                           - Patient  has a contact number available for                            emergencies. The signs and symptoms of potential                            delayed complications were discussed with the                            patient. Return to normal activities tomorrow.                            Written discharge instructions were provided to the                            patient.                           - Resume previous diet.                           - Continue present medications.                           -  Await pathology results. Docia Chuck. Henrene Pastor, MD 03/01/2021 11:22:21 AM This report has been signed electronically.

## 2021-03-01 NOTE — Progress Notes (Signed)
Called to room to assist during endoscopic procedure.  Patient ID and intended procedure confirmed with present staff. Received instructions for my participation in the procedure from the performing physician.  

## 2021-03-01 NOTE — Progress Notes (Signed)
Report to PACU, RN, vss, BBS= Clear.  

## 2021-03-01 NOTE — Patient Instructions (Signed)
Handout for polyps given.  YOU HAD AN ENDOSCOPIC PROCEDURE TODAY AT [facility]:   Refer to the procedure report that was given to you for any specific questions about what was found during the examination.  If the procedure report does not answer your questions, please call your gastroenterologist to clarify.  If you requested that your care partner not be given the details of your procedure findings, then the procedure report has been included in a sealed envelope for you to review at your convenience later.  YOU SHOULD EXPECT: Some feelings of bloating in the abdomen. Passage of more gas than usual.  Walking can help get rid of the air that was put into your GI tract during the procedure and reduce the bloating. If you had a lower endoscopy (such as a colonoscopy or flexible sigmoidoscopy) you may notice spotting of blood in your stool or on the toilet paper. If you underwent a bowel prep for your procedure, you may not have a normal bowel movement for a few days.  Please Note:  You might notice some irritation and congestion in your nose or some drainage.  This is from the oxygen used during your procedure.  There is no need for concern and it should clear up in a day or so.  SYMPTOMS TO REPORT IMMEDIATELY:  Following lower endoscopy (colonoscopy or flexible sigmoidoscopy):  Excessive amounts of blood in the stool  Significant tenderness or worsening of abdominal pains  Swelling of the abdomen that is new, acute  Fever of 100F or higher  For urgent or emergent issues, a gastroenterologist can be reached at any hour by calling [phone]. Do not use MyChart messaging for urgent concerns.    DIET:  We do recommend a small meal at first, but then you may proceed to your regular diet.  Drink plenty of fluids but you should avoid alcoholic beverages for 24 hours.  ACTIVITY:  You should plan to take it easy for the rest of today and you should NOT DRIVE or use heavy  machinery until tomorrow (because of the sedation medicines used during the test).    FOLLOW UP: Our staff will call the number listed on your records 48-72 hours following your procedure to check on you and address any questions or concerns that you may have regarding the information given to you following your procedure. If we do not reach you, we will leave a message.  We will attempt to reach you two times.  During this call, we will ask if you have developed any symptoms of COVID 19. If you develop any symptoms (ie: fever, flu-like symptoms, shortness of breath, cough etc.) before then, please call [phone].  If you test positive for Covid 19 in the 2 weeks post procedure, please call and report this information to [place].    If any biopsies were taken you will be contacted by phone or by letter within the next 1-3 weeks.  Please call us at [phone] if you have not heard about the biopsies in 3 weeks.    SIGNATURES/CONFIDENTIALITY: You and/or your care partner have signed paperwork which will be entered into your electronic medical record.  These signatures attest to the fact that that the information above on your After Visit Summary has been reviewed and is understood.  Full responsibility of the confidentiality of this discharge information lies with you and/or your care-partner.

## 2021-03-03 DIAGNOSIS — H43813 Vitreous degeneration, bilateral: Secondary | ICD-10-CM | POA: Diagnosis not present

## 2021-03-03 DIAGNOSIS — H25013 Cortical age-related cataract, bilateral: Secondary | ICD-10-CM | POA: Diagnosis not present

## 2021-03-03 DIAGNOSIS — H5213 Myopia, bilateral: Secondary | ICD-10-CM | POA: Diagnosis not present

## 2021-03-08 ENCOUNTER — Encounter: Payer: Self-pay | Admitting: Internal Medicine

## 2021-03-17 ENCOUNTER — Other Ambulatory Visit (HOSPITAL_COMMUNITY): Payer: Self-pay | Admitting: *Deleted

## 2021-03-18 ENCOUNTER — Ambulatory Visit (HOSPITAL_COMMUNITY)
Admission: RE | Admit: 2021-03-18 | Discharge: 2021-03-18 | Disposition: A | Discharge status: Home / Self Care | Payer: Medicare Other | Source: Ambulatory Visit | Attending: Internal Medicine | Admitting: Internal Medicine

## 2021-03-18 ENCOUNTER — Other Ambulatory Visit: Payer: Self-pay

## 2021-03-18 DIAGNOSIS — M810 Age-related osteoporosis without current pathological fracture: Secondary | ICD-10-CM | POA: Insufficient documentation

## 2021-03-18 MED ORDER — DENOSUMAB 60 MG/ML ~~LOC~~ SOSY
PREFILLED_SYRINGE | SUBCUTANEOUS | Status: AC
  Filled 2021-03-18: qty 1

## 2021-03-18 MED ORDER — DENOSUMAB 60 MG/ML ~~LOC~~ SOSY
60.0000 mg | PREFILLED_SYRINGE | Freq: Once | SUBCUTANEOUS | Status: AC
  Administered 2021-03-18: 60 mg via SUBCUTANEOUS

## 2021-03-23 DIAGNOSIS — Z20822 Contact with and (suspected) exposure to covid-19: Secondary | ICD-10-CM | POA: Diagnosis not present

## 2021-04-12 DIAGNOSIS — M5134 Other intervertebral disc degeneration, thoracic region: Secondary | ICD-10-CM | POA: Diagnosis not present

## 2021-04-12 DIAGNOSIS — M50321 Other cervical disc degeneration at C4-C5 level: Secondary | ICD-10-CM | POA: Diagnosis not present

## 2021-04-12 DIAGNOSIS — M9902 Segmental and somatic dysfunction of thoracic region: Secondary | ICD-10-CM | POA: Diagnosis not present

## 2021-04-12 DIAGNOSIS — M9901 Segmental and somatic dysfunction of cervical region: Secondary | ICD-10-CM | POA: Diagnosis not present

## 2021-04-14 DIAGNOSIS — M9902 Segmental and somatic dysfunction of thoracic region: Secondary | ICD-10-CM | POA: Diagnosis not present

## 2021-04-14 DIAGNOSIS — M50321 Other cervical disc degeneration at C4-C5 level: Secondary | ICD-10-CM | POA: Diagnosis not present

## 2021-04-14 DIAGNOSIS — M9901 Segmental and somatic dysfunction of cervical region: Secondary | ICD-10-CM | POA: Diagnosis not present

## 2021-04-14 DIAGNOSIS — M5134 Other intervertebral disc degeneration, thoracic region: Secondary | ICD-10-CM | POA: Diagnosis not present

## 2021-05-02 ENCOUNTER — Telehealth: Payer: Self-pay | Admitting: Nurse Practitioner

## 2021-05-02 NOTE — Telephone Encounter (Signed)
Was asked by patient's husband to set up an appointment for patient. I called and left her a message that Dr. Acie Fredrickson has some availability on Sept. 22 and to please call back to let us know if this date works for her. She was last seen by Dr. Acie Fredrickson in May 2017.

## 2021-05-17 DIAGNOSIS — M25522 Pain in left elbow: Secondary | ICD-10-CM | POA: Diagnosis not present

## 2021-05-17 DIAGNOSIS — M25511 Pain in right shoulder: Secondary | ICD-10-CM | POA: Diagnosis not present

## 2021-05-18 ENCOUNTER — Other Ambulatory Visit: Payer: Medicare Other

## 2021-05-18 ENCOUNTER — Ambulatory Visit
Admission: RE | Admit: 2021-05-18 | Discharge: 2021-05-18 | Disposition: A | Discharge status: Home / Self Care | Payer: Medicare Other | Source: Ambulatory Visit | Attending: Family Medicine | Admitting: Family Medicine

## 2021-05-18 ENCOUNTER — Other Ambulatory Visit: Payer: Self-pay | Admitting: Family Medicine

## 2021-05-18 DIAGNOSIS — M25511 Pain in right shoulder: Secondary | ICD-10-CM

## 2021-05-18 DIAGNOSIS — D179 Benign lipomatous neoplasm, unspecified: Secondary | ICD-10-CM | POA: Diagnosis not present

## 2021-05-18 DIAGNOSIS — S42211A Unspecified displaced fracture of surgical neck of right humerus, initial encounter for closed fracture: Secondary | ICD-10-CM | POA: Diagnosis not present

## 2021-05-18 DIAGNOSIS — M7989 Other specified soft tissue disorders: Secondary | ICD-10-CM | POA: Diagnosis not present

## 2021-05-19 ENCOUNTER — Encounter (HOSPITAL_BASED_OUTPATIENT_CLINIC_OR_DEPARTMENT_OTHER): Payer: Self-pay | Admitting: Orthopaedic Surgery

## 2021-05-19 ENCOUNTER — Other Ambulatory Visit: Payer: Self-pay

## 2021-05-20 ENCOUNTER — Encounter (HOSPITAL_BASED_OUTPATIENT_CLINIC_OR_DEPARTMENT_OTHER)
Admission: RE | Admit: 2021-05-20 | Discharge: 2021-05-20 | Disposition: A | Discharge status: Home / Self Care | Payer: Medicare Other | Source: Ambulatory Visit | Attending: Orthopaedic Surgery | Admitting: Orthopaedic Surgery

## 2021-05-20 DIAGNOSIS — Z01812 Encounter for preprocedural laboratory examination: Secondary | ICD-10-CM | POA: Insufficient documentation

## 2021-05-20 LAB — BASIC METABOLIC PANEL
Anion gap: 18 — ABNORMAL HIGH (ref 5–15)
BUN: 14 mg/dL (ref 8–23)
CO2: 22 mmol/L (ref 22–32)
Calcium: 9 mg/dL (ref 8.9–10.3)
Chloride: 91 mmol/L — ABNORMAL LOW (ref 98–111)
Creatinine, Ser: 1.23 mg/dL — ABNORMAL HIGH (ref 0.44–1.00)
GFR, Estimated: 48 mL/min — ABNORMAL LOW (ref >60)
Glucose, Bld: 108 mg/dL — ABNORMAL HIGH (ref 70–99)
Potassium: 4.1 mmol/L (ref 3.5–5.1)
Sodium: 131 mmol/L — ABNORMAL LOW (ref 135–145)

## 2021-05-20 NOTE — Progress Notes (Signed)
      Enhanced Recovery after Surgery for Orthopedics Enhanced Recovery after Surgery is a protocol used to improve the stress on your body and your recovery after surgery.  Patient Instructions  The night before surgery:  No food after midnight. ONLY clear liquids after midnight  The day of surgery (if you do NOT have diabetes):  Drink ONE (1) Pre-Surgery Clear Ensure as directed.   This drink was given to you during your hospital  pre-op appointment visit. The pre-op nurse will instruct you on the time to drink the  Pre-Surgery Ensure depending on your surgery time. Finish the drink at the designated time by the pre-op nurse.  Nothing else to drink after completing the  Pre-Surgery Clear Ensure.  The day of surgery (if you have diabetes): Drink ONE (1) Gatorade 2 (G2) as directed. This drink was given to you during your hospital  pre-op appointment visit.  The pre-op nurse will instruct you on the time to drink the   Gatorade 2 (G2) depending on your surgery time. Color of the Gatorade may vary. Red is not allowed. Nothing else to drink after completing the  Gatorade 2 (G2).         If you have questions, please contact your surgeon's office. Surgical soap given with instructions, pt verbalized understanding. Benzoyl peroxide gel given with written instructions, pt verbalized understanding.

## 2021-05-22 NOTE — H&P (Signed)
PREOPERATIVE H&P  Chief Complaint: right proximal humerus fracture  HPI: Brittany Washington is a 70 y.o. female who is scheduled for Procedure(s): REVERSE SHOULDER ARTHROPLASTY.   Patient has a past medical history significant for GERD, HTN, hypothyroidism.   Patient had an injury on 05/17/2021 when she fell landing on her right side. She missed 2 steps. She did not lose consciousness. She had immediate pain in the right arm and pain with movement.   Her symptoms are rated as moderate to severe, and have been worsening.  This is significantly impairing activities of daily living.    Please see clinic note for further details on this patient's care.    She has elected for surgical management.   Past Medical History:  Diagnosis Date   Benign hypertensive heart disease without heart failure    GERD (gastroesophageal reflux disease)    Heart murmur, systolic    MECHNICAL   Hypertension    mild, lost weight, no meds now   Hypothyroidism    Osteoporosis    Primary osteoarthritis of right hip 06/18/2018   Past Surgical History:  Procedure Laterality Date   APPENDECTOMY     BACK SURGERY     BIOPSY  03/10/2019   Procedure: BIOPSY;  Surgeon: Milus Banister, MD;  Location: [facility] ENDOSCOPY;  Service: Endoscopy;;   COLONOSCOPY     ESOPHAGOGASTRODUODENOSCOPY N/A 03/10/2019   Procedure: ESOPHAGOGASTRODUODENOSCOPY (EGD);  Surgeon: Milus Banister, MD;  Location: [facility] ENDOSCOPY;  Service: Endoscopy;  Laterality: N/A;   FRACTURE SURGERY     HAMMER TOE SURGERY Left 07/14/2013   Procedure: HAMMER TOE CORRECTION LEFT SECOND TOE, AUSTIN/AKIN LEFT;  Surgeon: [name], DPM;  Location: [place];  Service: Podiatry;  Laterality: Left;   JOINT REPLACEMENT     LAPAROSCOPIC APPENDECTOMY N/A 12/29/2016   Procedure: APPENDECTOMY LAPAROSCOPIC;  Surgeon: Excell Seltzer, MD;  Location: [facility] ORS;  Service: General;  Laterality: N/A;   [place]  DISKECTOMY W/ DISSECTION, LEFT L4 -- L5  02-23-2004   METATARSAL OSTEOTOMY Left 04/02/2015   Procedure: LEFT FOOT METATARSAL OSTEOTOMY;  Surgeon: [name], MD;  Location: [place];  Service: Orthopedics;  Laterality: Left;   METATARSAL OSTEOTOMY Left 08/18/2016   Procedure: METATARSAL OSTEOTOMY SECOND TOE;  Surgeon: [name], MD;  Location: [place];  Service: Orthopedics;  Laterality: Left;   ORIF LEFT DISTAL RADIAL FX W/ BONE GRAFT  01-14-2009   POSTERIOR LUMBAR FUSION  05-16-2005   L4 -- L5   RE-DO LUMBAR DISKECTOMY , L4  - L5  05-03-2004   TOTAL HIP ARTHROPLASTY Right 06/18/2018   TOTAL HIP ARTHROPLASTY Right 06/18/2018   Procedure: RIGHT TOTAL HIP ARTHROPLASTY ANTERIOR APPROACH;  Surgeon: [name], MD;  Location: [place];  Service: Orthopedics;  Laterality: Right;   TRIGGER FINGER RELEASE  01/09/2012   Procedure: RELEASE TRIGGER FINGER/A-1 PULLEY;  Surgeon: [name], MD;  Location: [place];  Service: Orthopedics;  Laterality: Left;   VAGINAL HYSTERECTOMY  1993   W/ BILATERAL SALPINGOOPHORECTOMY   WRIST FRACTURE SURGERY Left    Social History   Socioeconomic History   Marital status: Married    Spouse name: Not on file   Number of children: 1   Years of education: 14   Highest education level: Not on file  Occupational History   Occupation: RN  Tobacco Use   Smoking status: Never   Smokeless tobacco: Never  Vaping Use   Vaping Use: Never used  Substance and Sexual Activity   Alcohol use: Yes    Alcohol/week: 7.0 standard drinks    Types: 7 Glasses of wine per week    Comment: 1 WINE DAILY   Drug use: No   Sexual activity: Yes  Other Topics Concern   Not on file  Social History Narrative   Patient drinks about 2 cups of caffeine daily.   Patient is right handed.    Social Determinants of Health   Financial Resource Strain: Not on file  Food Insecurity: Not on file  Transportation Needs:  Not on file  Physical Activity: Not on file  Stress: Not on file  Social Connections: Not on file   Family History  Problem Relation Age of Onset   Heart attack Father    Colon cancer Father    Heart attack Brother    Ovarian cancer Mother    Rectal cancer Neg Hx    Stomach cancer Neg Hx    Breast cancer Neg Hx    Allergies  Allergen Reactions   Sulfa Antibiotics Diarrhea and Other (See Comments)    Body aches    Fosamax [Alendronate] Other (See Comments)    Heartburn, myalgias   Prior to Admission medications   Medication Sig Start Date End Date Taking? Authorizing Provider  acetaminophen (TYLENOL) 325 MG tablet Take 2 tablets (650 mg total) by mouth every 6 (six) hours as needed for mild pain (or Fever >/= 101). 03/11/19  Yes Black, Lezlie Octave, NP  cetirizine (ZYRTEC) 10 MG tablet Take 10 mg by mouth at bedtime.    Yes [provider]  Cholecalciferol (VITAMIN D3) 400 units CAPS Take 400 Units by mouth daily.   Yes [provider]  clonazePAM (KLONOPIN) 1 MG tablet Take 1 tablet (1 mg total) by mouth at bedtime. Take 1-2 tablets by mouth daily at bedtime as needed for sleep. Patient taking differently: Take 2 mg by mouth at bedtime. 12/20/15  Yes Nahser, Wonda Cheng, MD  diphenhydrAMINE (BENADRYL) 25 MG tablet Take 25 mg by mouth at bedtime as needed for allergies.   Yes [provider]  gabapentin (NEURONTIN) 300 MG capsule Take 300 mg by mouth 3 (three) times daily.   Yes [provider]  hydrochlorothiazide (MICROZIDE) 12.5 MG capsule TAKE 1 CAPSULE BY MOUTH DAILY. Patient taking differently: Take 12.5 mg by mouth daily. 09/21/15  Yes Darlin Coco, MD  levothyroxine (SYNTHROID, LEVOTHROID) 112 MCG tablet TAKE 1 TABLET BY MOUTH ONCE DAILY Patient taking differently: Take 112 mcg by mouth at bedtime. 10/19/14  Yes Lendon Colonel, NP  methocarbamol (ROBAXIN) 500 MG tablet Take 500 mg by mouth at bedtime as needed for muscle spasms.   Yes  [provider]  metoprolol tartrate (LOPRESSOR) 25 MG tablet Take 12.5 mg by mouth 2 (two) times daily.   Yes [provider]  oxyCODONE (OXY IR/ROXICODONE) 5 MG immediate release tablet Take 5 mg by mouth every 4 (four) hours as needed for severe pain.   Yes [provider]  pantoprazole (PROTONIX) 40 MG tablet Take 1 tablet (40 mg total) by mouth daily at 6 (six) AM. 03/11/19  Yes Black, Lezlie Octave, NP  Turmeric (QC TUMERIC COMPLEX) 500 MG CAPS Take by mouth.   Yes [provider]  valsartan (DIOVAN) 160 MG tablet Take 160 mg by mouth daily. 09/26/18  Yes [provider]  denosumab (PROLIA) 60 MG/ML SOSY injection Inject 60 mg into the  skin every 6 (six) months.    [provider]    ROS: All other systems have been reviewed and were otherwise negative with the exception of those mentioned in the HPI and as above.  Physical Exam: General: Alert, no acute distress Cardiovascular: No pedal edema Respiratory: No cyanosis, no use of accessory musculature GI: No organomegaly, abdomen is soft and non-tender Skin: No lesions in the area of chief complaint Neurologic: Sensation intact distally Psychiatric: Patient is competent for consent with normal mood and affect Lymphatic: No axillary or cervical lymphadenopathy  MUSCULOSKELETAL:  Right shoulder: swelling of right shoulder. ROM not tested in setting of known fracture. NVI  Imaging: Xrays of the right shoulder demonstrate a proximal humerus fracture with displacement of the greater tuberosity  Assessment: right proximal humerus fracture  Plan: Plan for Procedure(s): REVERSE SHOULDER ARTHROPLASTY  The risks benefits and alternatives were discussed with the patient including but not limited to the risks of nonoperative treatment, versus surgical intervention including infection, bleeding, nerve injury,  blood clots, cardiopulmonary complications, morbidity, mortality, among others, and  they were willing to proceed.   We additionally specifically discussed risks of axillary nerve injury, infection, periprosthetic fracture, continued pain and longevity of implants prior to beginning procedure.    Patient will be closely monitored in PACU for medical stabilization and pain control. If found stable in PACU, patient may be discharged home with outpatient follow-up. If any concerns regarding patient's stabilization patient will be admitted for observation after surgery. The patient is planning to be discharged home with outpatient PT.   The patient acknowledged the explanation, agreed to proceed with the plan and consent was signed.   Operative Plan: Right reverse total shoulder arthroplasty Discharge Medications: Standard DVT Prophylaxis: Aspirin Physical Therapy: Outpatient PT Special Discharge needs: Sling. Viola, PA-C  05/22/2021 3:47 PM

## 2021-05-23 ENCOUNTER — Encounter (HOSPITAL_BASED_OUTPATIENT_CLINIC_OR_DEPARTMENT_OTHER)
Admission: RE | Disposition: A | Discharge status: Home / Self Care | Payer: Self-pay | Source: Home / Self Care | Attending: Orthopaedic Surgery

## 2021-05-23 ENCOUNTER — Ambulatory Visit (HOSPITAL_BASED_OUTPATIENT_CLINIC_OR_DEPARTMENT_OTHER)
Admission: RE | Admit: 2021-05-23 | Discharge: 2021-05-24 | Disposition: A | Discharge status: Home / Self Care | Payer: Medicare Other | Attending: Orthopaedic Surgery | Admitting: Orthopaedic Surgery

## 2021-05-23 ENCOUNTER — Other Ambulatory Visit: Payer: Self-pay

## 2021-05-23 ENCOUNTER — Ambulatory Visit (HOSPITAL_COMMUNITY): Payer: Medicare Other

## 2021-05-23 ENCOUNTER — Encounter (HOSPITAL_BASED_OUTPATIENT_CLINIC_OR_DEPARTMENT_OTHER): Payer: Self-pay | Admitting: Orthopaedic Surgery

## 2021-05-23 ENCOUNTER — Ambulatory Visit (HOSPITAL_BASED_OUTPATIENT_CLINIC_OR_DEPARTMENT_OTHER): Payer: Medicare Other | Admitting: Anesthesiology

## 2021-05-23 DIAGNOSIS — Y939 Activity, unspecified: Secondary | ICD-10-CM | POA: Insufficient documentation

## 2021-05-23 DIAGNOSIS — Z8041 Family history of malignant neoplasm of ovary: Secondary | ICD-10-CM | POA: Insufficient documentation

## 2021-05-23 DIAGNOSIS — Z79899 Other long term (current) drug therapy: Secondary | ICD-10-CM | POA: Diagnosis not present

## 2021-05-23 DIAGNOSIS — I119 Hypertensive heart disease without heart failure: Secondary | ICD-10-CM | POA: Insufficient documentation

## 2021-05-23 DIAGNOSIS — S42201A Unspecified fracture of upper end of right humerus, initial encounter for closed fracture: Secondary | ICD-10-CM | POA: Diagnosis not present

## 2021-05-23 DIAGNOSIS — Z800 Family history of malignant neoplasm of digestive organs: Secondary | ICD-10-CM | POA: Diagnosis not present

## 2021-05-23 DIAGNOSIS — W109XXA Fall (on) (from) unspecified stairs and steps, initial encounter: Secondary | ICD-10-CM | POA: Insufficient documentation

## 2021-05-23 DIAGNOSIS — Z882 Allergy status to sulfonamides status: Secondary | ICD-10-CM | POA: Diagnosis not present

## 2021-05-23 DIAGNOSIS — Z79890 Hormone replacement therapy (postmenopausal): Secondary | ICD-10-CM | POA: Insufficient documentation

## 2021-05-23 DIAGNOSIS — S42301A Unspecified fracture of shaft of humerus, right arm, initial encounter for closed fracture: Secondary | ICD-10-CM | POA: Diagnosis not present

## 2021-05-23 DIAGNOSIS — Z888 Allergy status to other drugs, medicaments and biological substances status: Secondary | ICD-10-CM | POA: Diagnosis not present

## 2021-05-23 DIAGNOSIS — Z8249 Family history of ischemic heart disease and other diseases of the circulatory system: Secondary | ICD-10-CM | POA: Insufficient documentation

## 2021-05-23 DIAGNOSIS — Z96611 Presence of right artificial shoulder joint: Secondary | ICD-10-CM

## 2021-05-23 DIAGNOSIS — Z09 Encounter for follow-up examination after completed treatment for conditions other than malignant neoplasm: Secondary | ICD-10-CM

## 2021-05-23 DIAGNOSIS — K219 Gastro-esophageal reflux disease without esophagitis: Secondary | ICD-10-CM | POA: Diagnosis not present

## 2021-05-23 DIAGNOSIS — I10 Essential (primary) hypertension: Secondary | ICD-10-CM | POA: Diagnosis not present

## 2021-05-23 DIAGNOSIS — Z471 Aftercare following joint replacement surgery: Secondary | ICD-10-CM | POA: Diagnosis not present

## 2021-05-23 DIAGNOSIS — E039 Hypothyroidism, unspecified: Secondary | ICD-10-CM | POA: Insufficient documentation

## 2021-05-23 DIAGNOSIS — S42291A Other displaced fracture of upper end of right humerus, initial encounter for closed fracture: Secondary | ICD-10-CM | POA: Diagnosis not present

## 2021-05-23 DIAGNOSIS — G8918 Other acute postprocedural pain: Secondary | ICD-10-CM | POA: Diagnosis not present

## 2021-05-23 DIAGNOSIS — S42241A 4-part fracture of surgical neck of right humerus, initial encounter for closed fracture: Secondary | ICD-10-CM | POA: Diagnosis not present

## 2021-05-23 DIAGNOSIS — Z01818 Encounter for other preprocedural examination: Secondary | ICD-10-CM | POA: Diagnosis not present

## 2021-05-23 DIAGNOSIS — Z9889 Other specified postprocedural states: Secondary | ICD-10-CM | POA: Diagnosis not present

## 2021-05-23 DIAGNOSIS — S42251A Displaced fracture of greater tuberosity of right humerus, initial encounter for closed fracture: Secondary | ICD-10-CM | POA: Diagnosis not present

## 2021-05-23 HISTORY — PX: REVERSE SHOULDER ARTHROPLASTY: SHX5054

## 2021-05-23 SURGERY — ARTHROPLASTY, SHOULDER, TOTAL, REVERSE
Anesthesia: General | Site: Shoulder | Laterality: Right

## 2021-05-23 MED ORDER — PHENYLEPHRINE HCL (PRESSORS) 10 MG/ML IV SOLN
INTRAVENOUS | Status: AC
  Filled 2021-05-23: qty 1

## 2021-05-23 MED ORDER — FENTANYL CITRATE (PF) 100 MCG/2ML IJ SOLN
INTRAMUSCULAR | Status: AC
  Filled 2021-05-23: qty 2

## 2021-05-23 MED ORDER — BUPIVACAINE LIPOSOME 1.3 % IJ SUSP
INTRAMUSCULAR | Status: DC | PRN
  Administered 2021-05-23: 10 mL via PERINEURAL

## 2021-05-23 MED ORDER — LACTATED RINGERS IV SOLN: INTRAVENOUS | Status: DC

## 2021-05-23 MED ORDER — METHOCARBAMOL 500 MG PO TABS: 500.0000 mg | ORAL_TABLET | Freq: Four times a day (QID) | ORAL | Status: DC | PRN

## 2021-05-23 MED ORDER — TRANEXAMIC ACID-NACL 1000-0.7 MG/100ML-% IV SOLN
1000.0000 mg | INTRAVENOUS | Status: AC
  Administered 2021-05-23: 1000 mg via INTRAVENOUS

## 2021-05-23 MED ORDER — EPHEDRINE 5 MG/ML INJ
INTRAVENOUS | Status: AC
  Filled 2021-05-23: qty 5

## 2021-05-23 MED ORDER — ESMOLOL HCL 100 MG/10ML IV SOLN
INTRAVENOUS | Status: DC | PRN
  Administered 2021-05-23: 30 mg via INTRAVENOUS
  Administered 2021-05-23: 40 mg via INTRAVENOUS
  Administered 2021-05-23: 30 mg via INTRAVENOUS

## 2021-05-23 MED ORDER — MEPERIDINE HCL 25 MG/ML IJ SOLN: 6.2500 mg | INTRAMUSCULAR | Status: DC | PRN

## 2021-05-23 MED ORDER — POLYETHYLENE GLYCOL 3350 17 G PO PACK: 17.0000 g | PACK | Freq: Every day | ORAL | Status: DC | PRN

## 2021-05-23 MED ORDER — ONDANSETRON HCL 4 MG/2ML IJ SOLN: 4.0000 mg | Freq: Four times a day (QID) | INTRAMUSCULAR | Status: DC | PRN

## 2021-05-23 MED ORDER — PHENYLEPHRINE 40 MCG/ML (10ML) SYRINGE FOR IV PUSH (FOR BLOOD PRESSURE SUPPORT)
PREFILLED_SYRINGE | INTRAVENOUS | Status: AC
  Filled 2021-05-23: qty 10

## 2021-05-23 MED ORDER — DEXAMETHASONE SODIUM PHOSPHATE 10 MG/ML IJ SOLN
INTRAMUSCULAR | Status: AC
  Filled 2021-05-23: qty 1

## 2021-05-23 MED ORDER — LEVOTHYROXINE SODIUM 112 MCG PO TABS
112.0000 ug | ORAL_TABLET | Freq: Every day | ORAL | Status: DC
  Administered 2021-05-23: 112 ug via ORAL
  Filled 2021-05-23: qty 1

## 2021-05-23 MED ORDER — DEXAMETHASONE SODIUM PHOSPHATE 4 MG/ML IJ SOLN
INTRAMUSCULAR | Status: DC | PRN
  Administered 2021-05-23: 8 mg via INTRAVENOUS

## 2021-05-23 MED ORDER — BUPIVACAINE-EPINEPHRINE (PF) 0.5% -1:200000 IJ SOLN
INTRAMUSCULAR | Status: DC | PRN
  Administered 2021-05-23: 15 mL via PERINEURAL

## 2021-05-23 MED ORDER — GABAPENTIN 300 MG PO CAPS
300.0000 mg | ORAL_CAPSULE | Freq: Three times a day (TID) | ORAL | Status: DC
  Administered 2021-05-23 – 2021-05-24 (×2): 300 mg via ORAL
  Filled 2021-05-23 (×2): qty 1

## 2021-05-23 MED ORDER — ACETAMINOPHEN 500 MG PO TABS
1000.0000 mg | ORAL_TABLET | Freq: Four times a day (QID) | ORAL | Status: DC
  Administered 2021-05-23 – 2021-05-24 (×3): 1000 mg via ORAL
  Filled 2021-05-23 (×3): qty 2

## 2021-05-23 MED ORDER — OXYCODONE HCL 5 MG PO TABS: 5.0000 mg | ORAL_TABLET | Freq: Once | ORAL | Status: DC | PRN

## 2021-05-23 MED ORDER — ONDANSETRON HCL 4 MG/2ML IJ SOLN
INTRAMUSCULAR | Status: AC
  Filled 2021-05-23: qty 2

## 2021-05-23 MED ORDER — LACTATED RINGERS IV SOLN
INTRAVENOUS | Status: DC
Start: 2021-05-23 — End: 2021-05-24

## 2021-05-23 MED ORDER — PANTOPRAZOLE SODIUM 40 MG PO TBEC
40.0000 mg | DELAYED_RELEASE_TABLET | Freq: Every day | ORAL | Status: DC
  Administered 2021-05-24: 40 mg via ORAL
  Filled 2021-05-23: qty 1

## 2021-05-23 MED ORDER — METOCLOPRAMIDE HCL 5 MG PO TABS: 5.0000 mg | ORAL_TABLET | Freq: Three times a day (TID) | ORAL | Status: DC | PRN

## 2021-05-23 MED ORDER — PHENYLEPHRINE HCL (PRESSORS) 10 MG/ML IV SOLN
INTRAVENOUS | Status: DC | PRN
  Administered 2021-05-23: 120 ug via INTRAVENOUS
  Administered 2021-05-23: 80 ug via INTRAVENOUS
  Administered 2021-05-23: 120 ug via INTRAVENOUS

## 2021-05-23 MED ORDER — METHOCARBAMOL 1000 MG/10ML IJ SOLN
500.0000 mg | Freq: Four times a day (QID) | INTRAVENOUS | Status: DC | PRN
  Filled 2021-05-23: qty 5

## 2021-05-23 MED ORDER — OXYCODONE HCL 5 MG PO TABS: 5.0000 mg | ORAL_TABLET | ORAL | Status: DC | PRN

## 2021-05-23 MED ORDER — MIDAZOLAM HCL 2 MG/2ML IJ SOLN
INTRAMUSCULAR | Status: AC
  Filled 2021-05-23: qty 2

## 2021-05-23 MED ORDER — HYDROMORPHONE HCL 1 MG/ML IJ SOLN: 0.5000 mg | INTRAMUSCULAR | Status: DC | PRN

## 2021-05-23 MED ORDER — LIDOCAINE HCL (CARDIAC) PF 100 MG/5ML IV SOSY
PREFILLED_SYRINGE | INTRAVENOUS | Status: DC | PRN
  Administered 2021-05-23: 20 mg via INTRAVENOUS

## 2021-05-23 MED ORDER — METOCLOPRAMIDE HCL 5 MG/ML IJ SOLN: 5.0000 mg | Freq: Three times a day (TID) | INTRAMUSCULAR | Status: DC | PRN

## 2021-05-23 MED ORDER — NAPROXEN 250 MG PO TABS
250.0000 mg | ORAL_TABLET | Freq: Two times a day (BID) | ORAL | Status: DC
  Administered 2021-05-23 – 2021-05-24 (×2): 250 mg via ORAL
  Filled 2021-05-23 (×2): qty 1

## 2021-05-23 MED ORDER — HYDROCHLOROTHIAZIDE 12.5 MG PO CAPS
12.5000 mg | ORAL_CAPSULE | Freq: Every day | ORAL | Status: DC
  Administered 2021-05-24: 12.5 mg via ORAL
  Filled 2021-05-23: qty 1

## 2021-05-23 MED ORDER — PROPOFOL 10 MG/ML IV BOLUS
INTRAVENOUS | Status: DC | PRN
  Administered 2021-05-23: 30 mg via INTRAVENOUS
  Administered 2021-05-23: 150 mg via INTRAVENOUS

## 2021-05-23 MED ORDER — SUGAMMADEX SODIUM 200 MG/2ML IV SOLN
INTRAVENOUS | Status: DC | PRN
  Administered 2021-05-23: 140 mg via INTRAVENOUS

## 2021-05-23 MED ORDER — IRBESARTAN 150 MG PO TABS
150.0000 mg | ORAL_TABLET | Freq: Every day | ORAL | Status: DC
  Administered 2021-05-24: 150 mg via ORAL
  Filled 2021-05-23: qty 1

## 2021-05-23 MED ORDER — ONDANSETRON HCL 4 MG/2ML IJ SOLN
INTRAMUSCULAR | Status: DC | PRN
  Administered 2021-05-23: 4 mg via INTRAVENOUS

## 2021-05-23 MED ORDER — DOCUSATE SODIUM 100 MG PO CAPS
100.0000 mg | ORAL_CAPSULE | Freq: Two times a day (BID) | ORAL | Status: DC
  Administered 2021-05-23 – 2021-05-24 (×2): 100 mg via ORAL
  Filled 2021-05-23 (×2): qty 1

## 2021-05-23 MED ORDER — POTASSIUM CHLORIDE 2 MEQ/ML IV SOLN
INTRAVENOUS | Status: DC
  Filled 2021-05-23: qty 1000

## 2021-05-23 MED ORDER — FENTANYL CITRATE (PF) 100 MCG/2ML IJ SOLN
INTRAMUSCULAR | Status: DC | PRN
  Administered 2021-05-23 (×2): 50 ug via INTRAVENOUS

## 2021-05-23 MED ORDER — PROMETHAZINE HCL 25 MG/ML IJ SOLN: 6.2500 mg | INTRAMUSCULAR | Status: DC | PRN

## 2021-05-23 MED ORDER — TRANEXAMIC ACID-NACL 1000-0.7 MG/100ML-% IV SOLN
INTRAVENOUS | Status: AC
  Filled 2021-05-23: qty 100

## 2021-05-23 MED ORDER — ROCURONIUM BROMIDE 100 MG/10ML IV SOLN
INTRAVENOUS | Status: DC | PRN
  Administered 2021-05-23: 40 mg via INTRAVENOUS

## 2021-05-23 MED ORDER — EPHEDRINE SULFATE 50 MG/ML IJ SOLN
INTRAMUSCULAR | Status: DC | PRN
  Administered 2021-05-23: 5 mg via INTRAVENOUS
  Administered 2021-05-23: 10 mg via INTRAVENOUS

## 2021-05-23 MED ORDER — PHENYLEPHRINE HCL-NACL 20-0.9 MG/250ML-% IV SOLN
INTRAVENOUS | Status: DC | PRN
  Administered 2021-05-23: 40 ug/min via INTRAVENOUS

## 2021-05-23 MED ORDER — LIDOCAINE 2% (20 MG/ML) 5 ML SYRINGE
INTRAMUSCULAR | Status: AC
  Filled 2021-05-23: qty 5

## 2021-05-23 MED ORDER — CLONAZEPAM 0.5 MG PO TABS
1.0000 mg | ORAL_TABLET | Freq: Every day | ORAL | Status: DC
  Administered 2021-05-23: 1 mg via ORAL
  Filled 2021-05-23: qty 2

## 2021-05-23 MED ORDER — FENTANYL CITRATE (PF) 100 MCG/2ML IJ SOLN
50.0000 ug | Freq: Once | INTRAMUSCULAR | Status: AC
  Administered 2021-05-23: 50 ug via INTRAVENOUS

## 2021-05-23 MED ORDER — PROPOFOL 10 MG/ML IV BOLUS
INTRAVENOUS | Status: AC
  Filled 2021-05-23: qty 20

## 2021-05-23 MED ORDER — CEFAZOLIN SODIUM-DEXTROSE 2-4 GM/100ML-% IV SOLN
2.0000 g | Freq: Four times a day (QID) | INTRAVENOUS | Status: AC
Start: 2021-05-23 — End: 2021-05-24
  Administered 2021-05-23 – 2021-05-24 (×3): 2 g via INTRAVENOUS
  Filled 2021-05-23 (×2): qty 100

## 2021-05-23 MED ORDER — ACETAMINOPHEN 500 MG PO TABS
1000.0000 mg | ORAL_TABLET | Freq: Once | ORAL | Status: AC
  Administered 2021-05-23: 1000 mg via ORAL

## 2021-05-23 MED ORDER — VANCOMYCIN HCL 1000 MG IV SOLR
INTRAVENOUS | Status: DC | PRN
  Administered 2021-05-23: 1000 mg via TOPICAL

## 2021-05-23 MED ORDER — MIDAZOLAM HCL 2 MG/2ML IJ SOLN
1.0000 mg | Freq: Once | INTRAMUSCULAR | Status: AC
  Administered 2021-05-23: 1 mg via INTRAVENOUS

## 2021-05-23 MED ORDER — BISACODYL 5 MG PO TBEC: 5.0000 mg | DELAYED_RELEASE_TABLET | Freq: Every day | ORAL | Status: DC | PRN

## 2021-05-23 MED ORDER — OXYCODONE HCL 5 MG/5ML PO SOLN
5.0000 mg | Freq: Once | ORAL | Status: DC | PRN
Start: 2021-05-23 — End: 2021-05-24

## 2021-05-23 MED ORDER — METOPROLOL TARTRATE 25 MG PO TABS
ORAL_TABLET | ORAL | Status: AC
  Filled 2021-05-23: qty 1

## 2021-05-23 MED ORDER — ACETAMINOPHEN 500 MG PO TABS
ORAL_TABLET | ORAL | Status: AC
  Filled 2021-05-23: qty 2

## 2021-05-23 MED ORDER — OXYCODONE HCL 5 MG PO TABS: 10.0000 mg | ORAL_TABLET | ORAL | Status: DC | PRN

## 2021-05-23 MED ORDER — GABAPENTIN 300 MG PO CAPS
300.0000 mg | ORAL_CAPSULE | Freq: Once | ORAL | Status: AC
  Administered 2021-05-23: 300 mg via ORAL

## 2021-05-23 MED ORDER — GABAPENTIN 300 MG PO CAPS
ORAL_CAPSULE | ORAL | Status: AC
  Filled 2021-05-23: qty 1

## 2021-05-23 MED ORDER — CEFAZOLIN SODIUM-DEXTROSE 2-4 GM/100ML-% IV SOLN
INTRAVENOUS | Status: AC
  Filled 2021-05-23: qty 100

## 2021-05-23 MED ORDER — ONDANSETRON HCL 4 MG PO TABS: 4.0000 mg | ORAL_TABLET | Freq: Four times a day (QID) | ORAL | Status: DC | PRN

## 2021-05-23 MED ORDER — CEFAZOLIN SODIUM-DEXTROSE 2-4 GM/100ML-% IV SOLN
2.0000 g | INTRAVENOUS | Status: AC
  Administered 2021-05-23: 2 g via INTRAVENOUS

## 2021-05-23 MED ORDER — METOPROLOL TARTRATE 25 MG PO TABS
12.5000 mg | ORAL_TABLET | Freq: Two times a day (BID) | ORAL | Status: DC
  Administered 2021-05-23: 12.5 mg via ORAL

## 2021-05-23 MED ORDER — SODIUM CHLORIDE 0.9 % IR SOLN
Status: DC | PRN
  Administered 2021-05-23: 3000 mL

## 2021-05-23 MED ORDER — AMISULPRIDE (ANTIEMETIC) 5 MG/2ML IV SOLN: 10.0000 mg | Freq: Once | INTRAVENOUS | Status: DC | PRN

## 2021-05-23 SURGICAL SUPPLY — 69 items
AID PSTN UNV HD RSTRNT DISP (MISCELLANEOUS) ×1
APL PRP STRL LF DISP 70% ISPRP (MISCELLANEOUS) ×1
BASEPLATE GLENOSPHERE 25 STD (Miscellaneous) ×1 IMPLANT
BIT DRILL 3.2 PERIPHERAL SCREW (BIT) ×1 IMPLANT
BLADE HEX COATED 2.75 (ELECTRODE) IMPLANT
BLADE SAW SAG 73X25 THK (BLADE) ×1
BLADE SAW SGTL 73X25 THK (BLADE) ×1 IMPLANT
BLADE SURG 10 STRL SS (BLADE) IMPLANT
BLADE SURG 15 STRL LF DISP TIS (BLADE) IMPLANT
BLADE SURG 15 STRL SS (BLADE)
BNDG COHESIVE 4X5 TAN ST LF (GAUZE/BANDAGES/DRESSINGS) ×1 IMPLANT
BRUSH SCRUB EZ PLAIN DRY (MISCELLANEOUS) ×2 IMPLANT
BSPLAT GLND STD 25 RVRS SHLDR (Miscellaneous) ×1 IMPLANT
CHLORAPREP W/TINT 26 (MISCELLANEOUS) ×2 IMPLANT
CLSR STERI-STRIP ANTIMIC 1/2X4 (GAUZE/BANDAGES/DRESSINGS) ×2 IMPLANT
COOLER ICEMAN CLASSIC (MISCELLANEOUS) ×2 IMPLANT
COVER BACK TABLE 60X90IN (DRAPES) ×2 IMPLANT
COVER MAYO STAND STRL (DRAPES) ×2 IMPLANT
DECANTER SPIKE VIAL GLASS SM (MISCELLANEOUS) IMPLANT
DRAPE IMP U-DRAPE 54X76 (DRAPES) ×2 IMPLANT
DRAPE INCISE IOBAN 66X45 STRL (DRAPES) ×2 IMPLANT
DRAPE U-SHAPE 76X120 STRL (DRAPES) ×4 IMPLANT
DRSG AQUACEL AG ADV 3.5X 6 (GAUZE/BANDAGES/DRESSINGS) ×2 IMPLANT
ELECT BLADE 4.0 EZ CLEAN MEGAD (MISCELLANEOUS) ×2
ELECT REM PT RETURN 9FT ADLT (ELECTROSURGICAL) ×2
ELECTRODE BLDE 4.0 EZ CLN MEGD (MISCELLANEOUS) ×1 IMPLANT
ELECTRODE REM PT RTRN 9FT ADLT (ELECTROSURGICAL) ×1 IMPLANT
FACESHIELD WRAPAROUND (MASK) ×4 IMPLANT
FACESHIELD WRAPAROUND OR TEAM (MASK) ×1 IMPLANT
GLENOSPHERE REV SHOULDER 36 (Joint) ×1 IMPLANT
GLOVE SRG 8 PF TXTR STRL LF DI (GLOVE) ×1 IMPLANT
GLOVE SURG ENC MOIS LTX SZ6.5 (GLOVE) ×5 IMPLANT
GLOVE SURG LTX SZ8 (GLOVE) ×2 IMPLANT
GLOVE SURG UNDER POLY LF SZ6.5 (GLOVE) ×2 IMPLANT
GLOVE SURG UNDER POLY LF SZ8 (GLOVE) ×2
GOWN STRL REUS W/ TWL LRG LVL3 (GOWN DISPOSABLE) ×2 IMPLANT
GOWN STRL REUS W/TWL LRG LVL3 (GOWN DISPOSABLE) ×6
GOWN STRL REUS W/TWL XL LVL3 (GOWN DISPOSABLE) ×2 IMPLANT
GUIDEWIRE GLENOID 2.5X220 (WIRE) ×1 IMPLANT
HANDPIECE INTERPULSE COAX TIP (DISPOSABLE) ×2
IMPL REVERSE SHOULDER 0X3.5 (Shoulder) IMPLANT
IMPLANT REVERSE SHOULDER 0X3.5 (Shoulder) ×2 IMPLANT
INSERT REV KIT SHOULDER 9X36 (Joint) ×1 IMPLANT
KIT STABILIZATION SHOULDER (MISCELLANEOUS) ×2 IMPLANT
MANIFOLD NEPTUNE II (INSTRUMENTS) ×2 IMPLANT
PACK BASIN DAY SURGERY FS (CUSTOM PROCEDURE TRAY) ×2 IMPLANT
PACK SHOULDER (CUSTOM PROCEDURE TRAY) ×2 IMPLANT
PAD COLD SHLDR WRAP-ON (PAD) ×2 IMPLANT
PAD ORTHO SHOULDER 7X19 LRG (SOFTGOODS) ×2 IMPLANT
PENCIL SMOKE EVACUATOR (MISCELLANEOUS) IMPLANT
RESTRAINT HEAD UNIVERSAL NS (MISCELLANEOUS) ×2 IMPLANT
SCREW BONE THREAD 6.5X35 (Screw) ×1 IMPLANT
SCREW PERIPHERAL 30 (Screw) ×2 IMPLANT
SET HNDPC FAN SPRY TIP SCT (DISPOSABLE) ×1 IMPLANT
SHEET MEDIUM DRAPE 40X70 STRL (DRAPES) ×2 IMPLANT
SLEEVE SCD COMPRESS KNEE MED (STOCKING) ×2 IMPLANT
SPONGE T-LAP 18X18 ~~LOC~~+RFID (SPONGE) ×1 IMPLANT
STEM HUMERAL SZ4BX104 132.5D (Joint) ×1 IMPLANT
SUT ETHIBOND 2 V 37 (SUTURE) ×2 IMPLANT
SUT ETHIBOND NAB CT1 #1 30IN (SUTURE) ×2 IMPLANT
SUT FIBERWIRE #5 38 CONV NDL (SUTURE) ×8
SUT MNCRL AB 4-0 PS2 18 (SUTURE) IMPLANT
SUT VIC AB 0 CT1 27 (SUTURE)
SUT VIC AB 0 CT1 27XCR 8 STRN (SUTURE) IMPLANT
SUT VIC AB 3-0 SH 27 (SUTURE) ×2
SUT VIC AB 3-0 SH 27X BRD (SUTURE) ×1 IMPLANT
SUTURE FIBERWR #5 38 CONV NDL (SUTURE) ×4 IMPLANT
TOWEL GREEN STERILE FF (TOWEL DISPOSABLE) ×6 IMPLANT
TUBE SUCTION HIGH CAP CLEAR NV (SUCTIONS) ×1 IMPLANT

## 2021-05-23 NOTE — Interval H&P Note (Signed)
We additionally specifically discussed risks of axillary nerve injury, infection, periprosthetic fracture, continued pain and longevity of implants prior to beginning procedure.

## 2021-05-23 NOTE — Discharge Instructions (Addendum)
[name] MD, MPH Noemi Chapel, PA-C [place] [address] [phone] (tel)   [phone] (fax)   [place] may leave the operative dressing in place until your follow-up appointment. KEEP THE INCISIONS CLEAN AND DRY. There may be a small amount of fluid/bleeding leaking at the surgical site. This is normal after surgery.  If it fills with liquid or blood please call us immediately to change it for you. Use the provided ice machine or Ice packs as often as possible for the first 3-4 days, then as needed for pain relief.   Keep a layer of cloth or a shirt between your skin and the cooling unit to prevent frost bite as it can get very cold.  SHOWERING: - You may shower on Post-Op Day #2.  - The dressing is water resistant but do not scrub it as it may start to peel up.   - You may remove the sling for showering, but keep a water resistant pillow under the arm to keep both the  elbow and shoulder away from the body (mimicking the abduction sling).  - Gently pat the area dry.  - Do not soak the shoulder in water. Do not go swimming in the pool or ocean until your incision has completely healed (about 4-6 weeks after surgery) - KEEP THE INCISIONS CLEAN AND DRY.  EXERCISES Wear the sling at all times You may remove the sling for showering, but keep the arm across the chest or in a secondary sling.    Accidental/Purposeful External Rotation and shoulder flexion (reaching behind you) is to be avoided at all costs for the first month. Do not lift anything heavier than 1 pound until we discuss it further in clinic.  REGIONAL ANESTHESIA (NERVE BLOCKS) The anesthesia team may have performed a nerve block for you if safe in the setting of your care.  This is a great tool used to minimize pain.  Typically the block may start wearing off overnight but the long acting medicine may last for  3-4 days.  The nerve block wearing off can be a challenging period but please utilize your as needed pain medications to try and manage this period.    POST-OP MEDICATIONS- Multimodal approach to pain control In general your pain will be controlled with a combination of substances.  Prescriptions unless otherwise discussed are electronically sent to your pharmacy.  This is a carefully made plan we use to minimize narcotic use.      Acetaminophen - Non-narcotic pain medicine taken on a scheduled basis  Oxycodone - This is a strong narcotic, to be used only on an "as needed" basis for SEVERE pain. Aspirin '81mg'$  - This medicine is used to minimize the risk of blood clots after surgery. Continue to take your pantoprazole while taking this medication This will help protect your stomach while taking NSAIDs Zofran -  take as needed for nausea   FOLLOW-UP If you develop a Fever (>101.5), Redness or Drainage from the surgical incision site, please call our office to arrange for an evaluation. Please call the office to schedule a follow-up appointment for a wound check, 7-10 days post-operatively.  IF YOU HAVE ANY QUESTIONS, PLEASE FEEL FREE TO CALL OUR OFFICE.  HELPFUL INFORMATION  If you had a block, it will wear off between 8-24 hrs postop typically.  This is period when your pain may go from nearly zero to the pain  you would have had post-op without the block.  This is an abrupt transition but nothing dangerous is happening.  You may take an extra dose of narcotic when this happens.  Your arm will be in a sling following surgery. You will be in this sling for the next 4 weeks.  I will let you know the exact duration at your follow-up visit.  You may be more comfortable sleeping in a semi-seated position the first few nights following surgery.  Keep a pillow propped under the elbow and forearm for comfort.  If you have a recliner type of chair it might be beneficial.  If not that is fine too, but  it would be helpful to sleep propped up with pillows behind your operated shoulder as well under your elbow and forearm.  This will reduce pulling on the suture lines.  When dressing, put your operative arm in the sleeve first.  When getting undressed, take your operative arm out last.  Loose fitting, button-down shirts are recommended.  In most states it is against the law to drive while your arm is in a sling. And certainly against the law to drive while taking narcotics.  You may return to work/school in the next couple of days when you feel up to it. Desk work and typing in the sling is fine.  We suggest you use the pain medication the first night prior to going to bed, in order to ease any pain when the anesthesia wears off. You should avoid taking pain medications on an empty stomach as it will make you nauseous.  Do not drink alcoholic beverages or take illicit drugs when taking pain medications.  Pain medication may make you constipated.  Below are a few solutions to try in this order: Decrease the amount of pain medication if you aren't having pain. Drink lots of decaffeinated fluids. Drink prune juice and/or each dried prunes  If the first 3 don't work start with additional solutions Take Colace - an over-the-counter stool softener Take Senokot - an over-the-counter laxative Take Miralax - a stronger over-the-counter laxative   Dental Antibiotics:  In most cases prophylactic antibiotics for Dental procdeures after total joint surgery are not necessary.  Exceptions are as follows:  1. History of prior total joint infection  2. Severely immunocompromised (Organ Transplant, cancer chemotherapy, Rheumatoid biologic meds such as [place])  3. Poorly controlled diabetes (A1C &gt; 8.0, blood glucose over 200)  If you have one of these conditions, contact your surgeon for an antibiotic prescription, prior to your dental procedure.   For more information including helpful  videos and documents visit our website:   [email or web address]    Regional Anesthesia Blocks  1. Numbness or the inability to move the "blocked" extremity may last from 3-48 hours after placement. The length of time depends on the medication injected and your individual response to the medication. If the numbness is not going away after 48 hours, call your surgeon.  2. The extremity that is blocked will need to be protected until the numbness is gone and the  Strength has returned. Because you cannot feel it, you will need to take extra care to avoid injury. Because it may be weak, you may have difficulty moving it or using it. You may not know what position it is in without looking at it while the block is in effect.  3. For blocks in the legs and feet, returning to weight bearing and walking needs to be done carefully. You will  need to wait until the numbness is entirely gone and the strength has returned. You should be able to move your leg and foot normally before you try and bear weight or walk. You will need someone to be with you when you first try to ensure you do not fall and possibly risk injury.  4. Bruising and tenderness at the needle site are common side effects and will resolve in a few days.  5. Persistent numbness or new problems with movement should be communicated to the surgeon or the [place] [phone] [place] [phone]).   Information for Discharge Teaching: EXPAREL (bupivacaine liposome injectable suspension)   Your surgeon or anesthesiologist gave you EXPAREL(bupivacaine) to help control your pain after surgery.  EXPAREL is a local anesthetic that provides pain relief by numbing the tissue around the surgical site. EXPAREL is designed to release pain medication over time and can control pain for up to 72 hours. Depending on how you respond to EXPAREL, you may require less pain medication during  your recovery.  Possible side effects: Temporary loss of sensation or ability to move in the area where bupivacaine was injected. Nausea, vomiting, constipation Rarely, numbness and tingling in your mouth or lips, lightheadedness, or anxiety may occur. Call your doctor right away if you think you may be experiencing any of these sensations, or if you have other questions regarding possible side effects.  Follow all other discharge instructions given to you by your surgeon or nurse. Eat a healthy diet and drink plenty of water or other fluids.  If you return to the hospital for any reason within 96 hours following the administration of EXPAREL, it is important for health care providers to know that you have received this anesthetic. A teal colored band has been placed on your arm with the date, time and amount of EXPAREL you have received in order to alert and inform your health care providers. Please leave this armband in place for the full 96 hours following administration, and then you may remove the band.

## 2021-05-23 NOTE — Progress Notes (Signed)
Assisted Dr. Rodman Comp with right, ultrasound guided, interscalene  block. Side rails up, monitors on throughout procedure. See vital signs in flow sheet. Tolerated Procedure well.

## 2021-05-23 NOTE — Anesthesia Preprocedure Evaluation (Signed)
Anesthesia Evaluation  Patient identified by MRN, date of birth, ID band Patient awake    Reviewed: Allergy & Precautions, NPO status , Patient's Chart, lab work & pertinent test results  Airway Mallampati: II  TM Distance: >3 FB Neck ROM: Full    Dental no notable dental hx. (+) Teeth Intact   Pulmonary neg pulmonary ROS,    Pulmonary exam normal breath sounds clear to auscultation       Cardiovascular hypertension, Pt. on medications Normal cardiovascular exam Rhythm:Regular Rate:Normal     Neuro/Psych  Headaches, negative psych ROS   GI/Hepatic Neg liver ROS, GERD  Medicated and Controlled,Hematemesis   Endo/Other  Hypothyroidism   Renal/GU Hx/o AKI with perforated appendix  negative genitourinary   Musculoskeletal  (+) Arthritis , Osteoarthritis,    Abdominal   Peds  Hematology   Anesthesia Other Findings   Reproductive/Obstetrics                             Anesthesia Physical  Anesthesia Plan  ASA: II  Anesthesia Plan: General   Post-op Pain Management:  Regional for Post-op pain   Induction: Intravenous  PONV Risk Score and Plan: 3 and Treatment may vary due to age or medical condition, Ondansetron, Dexamethasone and Midazolam  Airway Management Planned: Oral ETT  Additional Equipment:   Intra-op Plan:   Post-operative Plan: Extubation in OR  Informed Consent: I have reviewed the patients History and Physical, chart, labs and discussed the procedure including the risks, benefits and alternatives for the proposed anesthesia with the patient or authorized representative who has indicated his/her understanding and acceptance.     Dental advisory given  Plan Discussed with: CRNA  Anesthesia Plan Comments:         Anesthesia Quick Evaluation

## 2021-05-23 NOTE — Transfer of Care (Signed)
Immediate Anesthesia Transfer of Care Note  Patient: Brittany Washington  Procedure(s) Performed: REVERSE SHOULDER ARTHROPLASTY (Right: Shoulder)  Patient Location: PACU  Anesthesia Type:General and Regional  Level of Consciousness: drowsy  Airway & Oxygen Therapy: Patient Spontanous Breathing and Patient connected to face mask oxygen  Post-op Assessment: Report given to RN and Post -op Vital signs reviewed and stable  Post vital signs: Reviewed and stable  Last Vitals:  Vitals Value Taken Time  BP 154/84 05/23/21 1708  Temp    Pulse 97 05/23/21 1710  Resp 20 05/23/21 1710  SpO2 90 % 05/23/21 1710  Vitals shown include unvalidated device data.  Last Pain:  Vitals:   05/23/21 1204  TempSrc: Oral  PainSc: 5       Patients Stated Pain Goal: 7 ([id number])  Complications: No notable events documented.

## 2021-05-23 NOTE — Anesthesia Procedure Notes (Signed)
Anesthesia Regional Block: Interscalene brachial plexus block   Pre-Anesthetic Checklist: , timeout performed,  Correct Patient, Correct Site, Correct Laterality,  Correct Procedure, Correct Position, site marked,  Risks and benefits discussed,  Surgical consent,  Pre-op evaluation,  At surgeon's request and post-op pain management  Laterality: Right  Prep: chloraprep       Needles:  Injection technique: Single-shot  Needle Type: Echogenic Stimulator Needle     Needle Length: 9cm  Needle Gauge: 21     Additional Needles:   Procedures:, nerve stimulator,,, ultrasound used (permanent image in chart),,     Nerve Stimulator or Paresthesia:  Response: deltoid and bicep, 0.5 mA  Additional Responses:   Narrative:  Start time: 05/23/2021 1:54 PM End time: 05/23/2021 1:59 PM Injection made incrementally with aspirations every 5 mL.  Performed by: Personally  Anesthesiologist: Suzette Battiest, MD

## 2021-05-23 NOTE — Anesthesia Postprocedure Evaluation (Signed)
Anesthesia Post Note  Patient: Brittany Washington  Procedure(s) Performed: REVERSE SHOULDER ARTHROPLASTY (Right: Shoulder)     Patient location during evaluation: PACU Anesthesia Type: General Level of consciousness: sedated Pain management: pain level controlled Vital Signs Assessment: post-procedure vital signs reviewed and stable Respiratory status: spontaneous breathing and respiratory function stable Cardiovascular status: stable Postop Assessment: no apparent nausea or vomiting Anesthetic complications: no   No notable events documented.  Last Vitals:  Vitals:   05/23/21 1715 05/23/21 1730  BP: 137/69 126/71  Pulse: 97 93  Resp: 19 16  Temp:    SpO2: 91% 96%    Last Pain:  Vitals:   05/23/21 1730  TempSrc:   PainSc: 0-No pain                 Maysen Sudol DANIEL

## 2021-05-23 NOTE — Anesthesia Procedure Notes (Signed)
Procedure Name: Intubation Date/Time: 05/23/2021 3:20 PM Performed by: Ezequiel Kayser, CRNA Pre-anesthesia Checklist: Patient identified, Emergency Drugs available, Suction available and Patient being monitored Patient Re-evaluated:Patient Re-evaluated prior to induction Oxygen Delivery Method: Circle System Utilized Preoxygenation: Pre-oxygenation with 100% oxygen Induction Type: IV induction Ventilation: Mask ventilation without difficulty Laryngoscope Size: Mac and 3 Grade View: Grade I Tube type: Oral Tube size: 7.0 mm Number of attempts: 1 Airway Equipment and Method: Stylet and Oral airway Placement Confirmation: ETT inserted through vocal cords under direct vision, positive ETCO2 and breath sounds checked- equal and bilateral Secured at: 22 cm Tube secured with: Tape Dental Injury: Teeth and Oropharynx as per pre-operative assessment

## 2021-05-24 DIAGNOSIS — Z882 Allergy status to sulfonamides status: Secondary | ICD-10-CM | POA: Diagnosis not present

## 2021-05-24 DIAGNOSIS — E039 Hypothyroidism, unspecified: Secondary | ICD-10-CM | POA: Diagnosis not present

## 2021-05-24 DIAGNOSIS — S42241A 4-part fracture of surgical neck of right humerus, initial encounter for closed fracture: Secondary | ICD-10-CM | POA: Diagnosis not present

## 2021-05-24 DIAGNOSIS — K219 Gastro-esophageal reflux disease without esophagitis: Secondary | ICD-10-CM | POA: Diagnosis not present

## 2021-05-24 DIAGNOSIS — I119 Hypertensive heart disease without heart failure: Secondary | ICD-10-CM | POA: Diagnosis not present

## 2021-05-24 DIAGNOSIS — Z888 Allergy status to other drugs, medicaments and biological substances status: Secondary | ICD-10-CM | POA: Diagnosis not present

## 2021-05-24 MED ORDER — MELOXICAM 15 MG PO TABS: 15.0000 mg | ORAL_TABLET | Freq: Every day | ORAL | 0 refills | Status: DC

## 2021-05-24 MED ORDER — CEFAZOLIN SODIUM-DEXTROSE 2-4 GM/100ML-% IV SOLN
INTRAVENOUS | Status: AC
  Filled 2021-05-24: qty 100

## 2021-05-24 MED ORDER — ACETAMINOPHEN 500 MG PO TABS: 1000.0000 mg | ORAL_TABLET | Freq: Three times a day (TID) | ORAL | 0 refills | Status: AC

## 2021-05-24 MED ORDER — ONDANSETRON HCL 4 MG PO TABS: 4.0000 mg | ORAL_TABLET | Freq: Three times a day (TID) | ORAL | 0 refills | Status: AC | PRN

## 2021-05-24 MED ORDER — OXYCODONE HCL 5 MG PO TABS: ORAL_TABLET | ORAL | 0 refills | Status: AC

## 2021-05-24 MED ORDER — ASPIRIN 81 MG PO CHEW: 81.0000 mg | CHEWABLE_TABLET | Freq: Two times a day (BID) | ORAL | 0 refills | Status: DC

## 2021-05-24 MED ORDER — ASPIRIN 81 MG PO CHEW: 81.0000 mg | CHEWABLE_TABLET | Freq: Every day | ORAL | 0 refills | Status: AC

## 2021-05-24 NOTE — Op Note (Signed)
Orthopaedic Surgery Operative Note (CSN: [id number])  Brittany Washington  20-Feb-1951 Date of Surgery: 05/23/2021   Diagnoses:  Right proximal humerus fracture, 4 part  Procedure: Right reverse total Shoulder Arthroplasty Right open reduction internal fixation greater tuberosity   Operative Finding Successful completion of planned procedure.  Uncomplicated procedure.  Patient had some calcar involvement however I was able to get good fit with standard implants.  We felt the benefit of long implants was outweighed by the possibility of increased risk for periprosthetic fracture in the long-term.  Bone quality was moderate to poor.  Great reduction of the tuberosities.  Appropriately stable joint for fracture case.  Post-operative plan: The patient will be NWB in sling.  The patient will be will be admitted to observation due to medical complexity, monitoring and pain management.  DVT prophylaxis Aspirin 81 mg twice daily for 6 weeks.  Pain control with PRN pain medication preferring oral medicines.  Follow up plan will be scheduled in approximately 7 days for incision check and XR.  Physical therapy to start immediately but no range of motion of the shoulder for the first 4 weeks.  Implants: Tornier size 4B long flex stem, 0 high offset tray with 9 polyethylene, 25 standard baseplate with a 35 center screw, 36 glenosphere  Post-Op Diagnosis: Same Surgeons:Primary: Hiram Gash, MD Assistants:Caroline McBane PA-C Location: [facility] OR ROOM 6 Anesthesia: General with Exparel Interscalene Antibiotics: Ancef 2g preop, Vancomycin $RemoveBeforeDEI'1000mg'rKXGjNjRNDcbKgrH$  locally Tourniquet time: None Estimated Blood Loss: 655 Complications: None Specimens: None Implants: Implant Name Type Inv. Item Serial No. Manufacturer Lot No. LRB No. Used Action  BASEPLATE GLENOSPHERE 37SM STD - [id number] Miscellaneous BASEPLATE GLENOSPHERE 49EE STD  [employer] [id number] Right 1 Implanted  BONE SCREW THREAD 6.5X35MM - [id number] Screw BONE  SCREW THREAD 6.5X35MM  [employer]  Right 1 Implanted  GLENOSPHERE REV SHOULDER 36 - [id number] Joint GLENOSPHERE REV SHOULDER 36  [employer] [id number] Right 1 Implanted  SCREW PERIPHERAL 30 - [id number] Screw SCREW PERIPHERAL 30  [employer]  Right 2 Implanted  INSERT REV KIT SHOULDER 9X36 - [id number] Joint INSERT REV KIT SHOULDER 9X36  [employer] [id number] Right 1 Implanted  STEM HUMERAL [id number] 132.5D - [id number] Joint STEM HUMERAL [id number] 132.5D  [employer] [id number] Right 1 Implanted  IMPLANT REVERSE SHOULDER 0X3.5 - [id number] Shoulder IMPLANT REVERSE SHOULDER 0X3.[place] [id number] Right 1 Implanted    Indications for Surgery:   Brittany Washington is a 70 y.o. female with fall resulting in a four-part proximal humerus fracture.  Benefits and risks of operative and nonoperative management were discussed prior to surgery with patient/guardian(s) and informed consent form was completed.  Infection and need for further surgery were discussed as was prosthetic stability and cuff issues.  We additionally specifically discussed risks of axillary nerve injury, infection, periprosthetic fracture, continued pain and longevity of implants prior to beginning procedure.      Procedure:   The patient was identified in the preoperative holding area where the surgical site was marked. Block placed by anesthesia with exparel.  The patient was taken to the OR where a procedural timeout was called and the above noted anesthesia was induced.  The patient was positioned beachchair on allen table with spider arm positioner.  Preoperative antibiotics were dosed.  The patient's right shoulder was prepped and draped in the usual sterile fashion.  A second preoperative timeout was called.       Standard deltopectoral approach was performed with a #10 blade. We  dissected down to the subcutaneous tissues and the cephalic vein was taken laterally with the deltoid. Clavipectoral fascia was incised in line  with the incision. Deep retractors were placed. The long of the biceps tendon was identified and there was significant tenosynovitis present.  Tenodesis was performed to the pectoralis tendon with #2 Ethibond. The remaining biceps was followed up into the rotator interval where it was released.    We used the bicipital groove as a landmark for the lesser and greater tuberosity fragments.  We were able to mobilize the lesser tuberosity fragment and placed stay sutures in the bone tendon junction to help with mobilization.  This point we were able to identify the  greater tuberosity fragment and 4 #5  FiberWire sutures were used to place into this for eventual repair of the tuberosities.  Once these were both mobilized we took care to identify the shaft fragment as well as the head fragment.  We carefully identified the head fragment were able to manually remove it.  At this point the axillary nerve was found and palpated and with a tug test noted to be intact.  Protected throughout the remainder of the case with blunt retractors.    We then released the SGHL with bovie cautery prior to placing a curved mayo at the junction of the anterior glenoid well above the axillary nerve and bluntly dissecting the subscapularis from the capsule.  We then carefully protected the axillary nerve as we gently released the inferior capsule to fully mobilize the subscapularis.  An anterior deltoid retractor was then placed as well as a small Hohmann retractor superiorly.   The glenoid was relatively preserved as we would expect in this fracture patient.  The remaining labrum was removed circumferentially taking great care not to disrupt the posterior capsule.    The glenoid drill guide was placed and used to drill a guide pin in the center, inferior position. The glenoid face was then reamed concentrically over the guide wire. The center hole was drilled over the guidepin in a near anatomic angle of version. Next the glenoid  vault was drilled back to a depth of 35 mm.  We tapped and then placed a 80mm size baseplate with 0 lateralization was selected with a 6.5 mm x 55mm length central screw.  The base plate was screwed into the glenoid vault obtaining secure fixation. We next placed superior and inferior locking screws for additional fixation.  Next a 36 mm glenosphere was selected and impacted onto the baseplate. The center screw was tightened.   We then repositioned the arm to give access to the humeral shaft fragment.  Drill holes were placed and fiberwire sutures in the shaft for vertical fixation of the tuberosities.  We broached starting with a size one broach and broaching up to 4 which obtained an appropriate fit above the pec.    We trialed with multiple size tray and polyethylene options and selected a 0 high which provided good stability and range of motion without excess soft tissue tension. The offset was dialed in to match the normal anatomy. The shoulder was trialed.  There was good ROM in all planes and the shoulder was stable with no inferior translation.   We then mobilized her tuberosities again and placed the anterior deep limbs of the 4 #5 fiber wires around the stem.  1 of these was tied down fixing the greater tuberosity in place after bone graft harvest from the humeral head component was placed underneath.  A +0 high offset tray was selected and impacted onto the stem.   A 36+9 polyethylene liner was impacted onto the stem.  The joint was reduced and thoroughly irrigated with pulsatile lavage. The remaining sutures were then placed through the subscapularis and the bone tendon junction and the tuberosities were reduced after bone graft placed beneath as autograft at the subscap.  We horizontally secured the tuberosities before placing vertical fixation with the suture that was placed into the shaft.  Tuberosities moved as a unit were happy with her overall reduction.  This was checked on fluoroscopy  confirming our position.  We irrigated copiously at this point.  Hemostasis was obtained. The deltopectoral interval was reapproximated with #1 Ethibond. The subcutaneous tissues were closed with 3-0 Vicryl and the skin was closed with running monocryl.     The wounds were cleaned and dried and an Aquacel dressing was placed. The drapes taken down. The arm was placed into sling with abduction pillow. Patient was awakened, extubated, and transferred to the recovery room in stable condition. There were no intraoperative complications. The sponge, needle, and attention counts were correct at the end of the case.     Noemi Chapel, PA-C, present and scrubbed throughout the case, critical for completion in a timely fashion, and for retraction, instrumentation, closure.

## 2021-05-24 NOTE — Discharge Summary (Signed)
Patient ID: Brittany Washington MRN: [id number] DOB/AGE: 10/03/1950 70 y.o.  Admit date: 05/23/2021 Discharge date: 05/24/2021  Admission Diagnoses: right proximal humerus fracture  Discharge Diagnoses:  Active Problems:   Status post reverse total arthroplasty of right shoulder   Past Medical History:  Diagnosis Date   Benign hypertensive heart disease without heart failure    GERD (gastroesophageal reflux disease)    Heart murmur, systolic    MECHNICAL   Hypertension    mild, lost weight, no meds now   Hypothyroidism    Osteoporosis    Primary osteoarthritis of right hip 06/18/2018     Procedures Performed: Right reverse total shoulder arthroplasty for fracture  Discharged Condition: good  Hospital Course: Patient brought in to [facility] for scheduled procedure.  She tolerated procedure well.  She was kept for monitoring overnight for pain control and medical monitoring postop and was found to be stable for DC home the morning after surgery.  Patient was instructed on specific activity restrictions and all questions were answered.  Consults: None  Significant Diagnostic Studies: No additional pertinent studies  Treatments: Surgery  Discharge Exam: General: sitting up in hospital bed, NAD Cardiac: regular rate Pulmonary: No increased work of breathing RUE: Dressing CDI and sling well fitting. Swelling of upper arm. Axillary nerve altered in setting of block and unable to be fully tested. Distal sensation grossly intact. Warm well perfused hand.    Disposition: Discharge disposition: 01-Home or Self Care       Discharge Instructions     Call MD for:  redness, tenderness, or signs of infection (pain, swelling, redness, odor or green/yellow discharge around incision site)   Complete by: As directed    Call MD for:  severe uncontrolled pain   Complete by: As directed    Call MD for:  temperature >100.4   Complete by: As directed    Diet - low  sodium heart healthy   Complete by: As directed       Allergies as of 05/24/2021       Reactions   Sulfa Antibiotics Diarrhea, Other (See Comments)   Body aches    Fosamax [alendronate] Other (See Comments)   Heartburn, myalgias        Medication List     TAKE these medications    acetaminophen 500 MG tablet Commonly known as: TYLENOL Take 2 tablets (1,000 mg total) by mouth every 8 (eight) hours for 14 days. What changed:  medication strength how much to take when to take this reasons to take this   aspirin 81 MG chewable tablet Commonly known as: Aspirin Childrens Chew 1 tablet (81 mg total) by mouth daily. For DVT prophylaxis after surgery   cetirizine 10 MG tablet Commonly known as: ZYRTEC Take 10 mg by mouth at bedtime.   clonazePAM 1 MG tablet Commonly known as: KLONOPIN Take 1 tablet (1 mg total) by mouth at bedtime. Take 1-2 tablets by mouth daily at bedtime as needed for sleep. What changed:  how much to take additional instructions   denosumab 60 MG/ML Sosy injection Commonly known as: PROLIA Inject 60 mg into the skin every 6 (six) months.   diphenhydrAMINE 25 MG tablet Commonly known as: BENADRYL Take 25 mg by mouth at bedtime as needed for allergies.   gabapentin 300 MG capsule Commonly known as: NEURONTIN Take 300 mg by mouth 3 (three) times daily.   hydrochlorothiazide 12.5 MG capsule Commonly known as: MICROZIDE TAKE 1 CAPSULE BY MOUTH  DAILY.   levothyroxine 112 MCG tablet Commonly known as: SYNTHROID TAKE 1 TABLET BY MOUTH ONCE DAILY What changed: when to take this   methocarbamol 500 MG tablet Commonly known as: ROBAXIN Take 500 mg by mouth at bedtime as needed for muscle spasms.   metoprolol tartrate 25 MG tablet Commonly known as: LOPRESSOR Take 12.5 mg by mouth 2 (two) times daily.   ondansetron 4 MG tablet Commonly known as: Zofran Take 1 tablet (4 mg total) by mouth every 8 (eight) hours as needed for up to 7 days for  nausea or vomiting.   oxyCODONE 5 MG immediate release tablet Commonly known as: Oxy IR/ROXICODONE Take 1-2 pills every 6 hrs as needed for severe pain, no more than 6 per day What changed:  how much to take how to take this when to take this reasons to take this additional instructions   pantoprazole 40 MG tablet Commonly known as: PROTONIX Take 1 tablet (40 mg total) by mouth daily at 6 (six) AM.   QC Tumeric Complex 500 MG Caps Generic drug: Turmeric Take by mouth.   valsartan 160 MG tablet Commonly known as: DIOVAN Take 160 mg by mouth daily.   Vitamin D3 10 MCG (400 UNIT) Caps Take 400 Units by mouth daily.        Noemi Chapel, PA-C 05/24/2021

## 2021-05-25 DIAGNOSIS — M25611 Stiffness of right shoulder, not elsewhere classified: Secondary | ICD-10-CM | POA: Diagnosis not present

## 2021-05-25 DIAGNOSIS — M6281 Muscle weakness (generalized): Secondary | ICD-10-CM | POA: Diagnosis not present

## 2021-05-25 DIAGNOSIS — Z96611 Presence of right artificial shoulder joint: Secondary | ICD-10-CM | POA: Diagnosis not present

## 2021-05-25 DIAGNOSIS — S42201D Unspecified fracture of upper end of right humerus, subsequent encounter for fracture with routine healing: Secondary | ICD-10-CM | POA: Diagnosis not present

## 2021-05-30 ENCOUNTER — Encounter (HOSPITAL_BASED_OUTPATIENT_CLINIC_OR_DEPARTMENT_OTHER): Payer: Self-pay | Admitting: Orthopaedic Surgery

## 2021-05-31 DIAGNOSIS — S42251D Displaced fracture of greater tuberosity of right humerus, subsequent encounter for fracture with routine healing: Secondary | ICD-10-CM | POA: Diagnosis not present

## 2021-06-10 DIAGNOSIS — S42201D Unspecified fracture of upper end of right humerus, subsequent encounter for fracture with routine healing: Secondary | ICD-10-CM | POA: Diagnosis not present

## 2021-06-10 DIAGNOSIS — M25611 Stiffness of right shoulder, not elsewhere classified: Secondary | ICD-10-CM | POA: Diagnosis not present

## 2021-06-10 DIAGNOSIS — Z96611 Presence of right artificial shoulder joint: Secondary | ICD-10-CM | POA: Diagnosis not present

## 2021-06-10 DIAGNOSIS — M6281 Muscle weakness (generalized): Secondary | ICD-10-CM | POA: Diagnosis not present

## 2021-06-16 DIAGNOSIS — Z23 Encounter for immunization: Secondary | ICD-10-CM | POA: Diagnosis not present

## 2021-06-21 DIAGNOSIS — S42201D Unspecified fracture of upper end of right humerus, subsequent encounter for fracture with routine healing: Secondary | ICD-10-CM | POA: Diagnosis not present

## 2021-06-22 DIAGNOSIS — M25512 Pain in left shoulder: Secondary | ICD-10-CM | POA: Diagnosis not present

## 2021-06-22 DIAGNOSIS — M25632 Stiffness of left wrist, not elsewhere classified: Secondary | ICD-10-CM | POA: Diagnosis not present

## 2021-06-22 DIAGNOSIS — M6281 Muscle weakness (generalized): Secondary | ICD-10-CM | POA: Diagnosis not present

## 2021-06-22 DIAGNOSIS — M25611 Stiffness of right shoulder, not elsewhere classified: Secondary | ICD-10-CM | POA: Diagnosis not present

## 2021-06-24 DIAGNOSIS — S42201D Unspecified fracture of upper end of right humerus, subsequent encounter for fracture with routine healing: Secondary | ICD-10-CM | POA: Diagnosis not present

## 2021-06-24 DIAGNOSIS — Z96611 Presence of right artificial shoulder joint: Secondary | ICD-10-CM | POA: Diagnosis not present

## 2021-06-24 DIAGNOSIS — M6281 Muscle weakness (generalized): Secondary | ICD-10-CM | POA: Diagnosis not present

## 2021-06-24 DIAGNOSIS — M25611 Stiffness of right shoulder, not elsewhere classified: Secondary | ICD-10-CM | POA: Diagnosis not present

## 2021-06-27 DIAGNOSIS — M25611 Stiffness of right shoulder, not elsewhere classified: Secondary | ICD-10-CM | POA: Diagnosis not present

## 2021-06-27 DIAGNOSIS — M6281 Muscle weakness (generalized): Secondary | ICD-10-CM | POA: Diagnosis not present

## 2021-06-27 DIAGNOSIS — S42201D Unspecified fracture of upper end of right humerus, subsequent encounter for fracture with routine healing: Secondary | ICD-10-CM | POA: Diagnosis not present

## 2021-06-27 DIAGNOSIS — Z96611 Presence of right artificial shoulder joint: Secondary | ICD-10-CM | POA: Diagnosis not present

## 2021-06-29 DIAGNOSIS — S42201D Unspecified fracture of upper end of right humerus, subsequent encounter for fracture with routine healing: Secondary | ICD-10-CM | POA: Diagnosis not present

## 2021-06-29 DIAGNOSIS — M6281 Muscle weakness (generalized): Secondary | ICD-10-CM | POA: Diagnosis not present

## 2021-06-29 DIAGNOSIS — Z96611 Presence of right artificial shoulder joint: Secondary | ICD-10-CM | POA: Diagnosis not present

## 2021-06-29 DIAGNOSIS — M25611 Stiffness of right shoulder, not elsewhere classified: Secondary | ICD-10-CM | POA: Diagnosis not present

## 2021-07-04 DIAGNOSIS — M6281 Muscle weakness (generalized): Secondary | ICD-10-CM | POA: Diagnosis not present

## 2021-07-04 DIAGNOSIS — S42201D Unspecified fracture of upper end of right humerus, subsequent encounter for fracture with routine healing: Secondary | ICD-10-CM | POA: Diagnosis not present

## 2021-07-04 DIAGNOSIS — Z96611 Presence of right artificial shoulder joint: Secondary | ICD-10-CM | POA: Diagnosis not present

## 2021-07-04 DIAGNOSIS — M25611 Stiffness of right shoulder, not elsewhere classified: Secondary | ICD-10-CM | POA: Diagnosis not present

## 2021-07-06 DIAGNOSIS — M6281 Muscle weakness (generalized): Secondary | ICD-10-CM | POA: Diagnosis not present

## 2021-07-06 DIAGNOSIS — Z96611 Presence of right artificial shoulder joint: Secondary | ICD-10-CM | POA: Diagnosis not present

## 2021-07-06 DIAGNOSIS — M25611 Stiffness of right shoulder, not elsewhere classified: Secondary | ICD-10-CM | POA: Diagnosis not present

## 2021-07-06 DIAGNOSIS — S42201D Unspecified fracture of upper end of right humerus, subsequent encounter for fracture with routine healing: Secondary | ICD-10-CM | POA: Diagnosis not present

## 2021-07-08 ENCOUNTER — Ambulatory Visit: Payer: Medicare Other | Admitting: Cardiovascular Disease

## 2021-07-13 DIAGNOSIS — M6281 Muscle weakness (generalized): Secondary | ICD-10-CM | POA: Diagnosis not present

## 2021-07-13 DIAGNOSIS — S42201D Unspecified fracture of upper end of right humerus, subsequent encounter for fracture with routine healing: Secondary | ICD-10-CM | POA: Diagnosis not present

## 2021-07-13 DIAGNOSIS — Z96611 Presence of right artificial shoulder joint: Secondary | ICD-10-CM | POA: Diagnosis not present

## 2021-07-13 DIAGNOSIS — M25611 Stiffness of right shoulder, not elsewhere classified: Secondary | ICD-10-CM | POA: Diagnosis not present

## 2021-07-15 DIAGNOSIS — M6281 Muscle weakness (generalized): Secondary | ICD-10-CM | POA: Diagnosis not present

## 2021-07-15 DIAGNOSIS — S42201D Unspecified fracture of upper end of right humerus, subsequent encounter for fracture with routine healing: Secondary | ICD-10-CM | POA: Diagnosis not present

## 2021-07-15 DIAGNOSIS — M25611 Stiffness of right shoulder, not elsewhere classified: Secondary | ICD-10-CM | POA: Diagnosis not present

## 2021-07-15 DIAGNOSIS — Z96611 Presence of right artificial shoulder joint: Secondary | ICD-10-CM | POA: Diagnosis not present

## 2021-07-18 DIAGNOSIS — Z96611 Presence of right artificial shoulder joint: Secondary | ICD-10-CM | POA: Diagnosis not present

## 2021-07-18 DIAGNOSIS — M6281 Muscle weakness (generalized): Secondary | ICD-10-CM | POA: Diagnosis not present

## 2021-07-18 DIAGNOSIS — M25611 Stiffness of right shoulder, not elsewhere classified: Secondary | ICD-10-CM | POA: Diagnosis not present

## 2021-07-18 DIAGNOSIS — S42201D Unspecified fracture of upper end of right humerus, subsequent encounter for fracture with routine healing: Secondary | ICD-10-CM | POA: Diagnosis not present

## 2021-07-20 DIAGNOSIS — Z96611 Presence of right artificial shoulder joint: Secondary | ICD-10-CM | POA: Diagnosis not present

## 2021-07-20 DIAGNOSIS — S42201D Unspecified fracture of upper end of right humerus, subsequent encounter for fracture with routine healing: Secondary | ICD-10-CM | POA: Diagnosis not present

## 2021-07-20 DIAGNOSIS — M6281 Muscle weakness (generalized): Secondary | ICD-10-CM | POA: Diagnosis not present

## 2021-07-20 DIAGNOSIS — M25611 Stiffness of right shoulder, not elsewhere classified: Secondary | ICD-10-CM | POA: Diagnosis not present

## 2021-07-22 DIAGNOSIS — Z23 Encounter for immunization: Secondary | ICD-10-CM | POA: Diagnosis not present

## 2021-07-26 DIAGNOSIS — M25611 Stiffness of right shoulder, not elsewhere classified: Secondary | ICD-10-CM | POA: Diagnosis not present

## 2021-07-26 DIAGNOSIS — M6281 Muscle weakness (generalized): Secondary | ICD-10-CM | POA: Diagnosis not present

## 2021-07-26 DIAGNOSIS — Z96611 Presence of right artificial shoulder joint: Secondary | ICD-10-CM | POA: Diagnosis not present

## 2021-07-26 DIAGNOSIS — S42201D Unspecified fracture of upper end of right humerus, subsequent encounter for fracture with routine healing: Secondary | ICD-10-CM | POA: Diagnosis not present

## 2021-08-01 DIAGNOSIS — Z96611 Presence of right artificial shoulder joint: Secondary | ICD-10-CM | POA: Diagnosis not present

## 2021-08-01 DIAGNOSIS — M6281 Muscle weakness (generalized): Secondary | ICD-10-CM | POA: Diagnosis not present

## 2021-08-01 DIAGNOSIS — M25611 Stiffness of right shoulder, not elsewhere classified: Secondary | ICD-10-CM | POA: Diagnosis not present

## 2021-08-01 DIAGNOSIS — S42201D Unspecified fracture of upper end of right humerus, subsequent encounter for fracture with routine healing: Secondary | ICD-10-CM | POA: Diagnosis not present

## 2021-08-03 DIAGNOSIS — Z96611 Presence of right artificial shoulder joint: Secondary | ICD-10-CM | POA: Diagnosis not present

## 2021-08-03 DIAGNOSIS — M6281 Muscle weakness (generalized): Secondary | ICD-10-CM | POA: Diagnosis not present

## 2021-08-03 DIAGNOSIS — S42201D Unspecified fracture of upper end of right humerus, subsequent encounter for fracture with routine healing: Secondary | ICD-10-CM | POA: Diagnosis not present

## 2021-08-03 DIAGNOSIS — M25611 Stiffness of right shoulder, not elsewhere classified: Secondary | ICD-10-CM | POA: Diagnosis not present

## 2021-08-09 DIAGNOSIS — M25611 Stiffness of right shoulder, not elsewhere classified: Secondary | ICD-10-CM | POA: Diagnosis not present

## 2021-08-09 DIAGNOSIS — M6281 Muscle weakness (generalized): Secondary | ICD-10-CM | POA: Diagnosis not present

## 2021-08-09 DIAGNOSIS — S42201D Unspecified fracture of upper end of right humerus, subsequent encounter for fracture with routine healing: Secondary | ICD-10-CM | POA: Diagnosis not present

## 2021-08-09 DIAGNOSIS — Z96611 Presence of right artificial shoulder joint: Secondary | ICD-10-CM | POA: Diagnosis not present

## 2021-08-09 DIAGNOSIS — Z20822 Contact with and (suspected) exposure to covid-19: Secondary | ICD-10-CM | POA: Diagnosis not present

## 2021-08-10 ENCOUNTER — Ambulatory Visit (INDEPENDENT_AMBULATORY_CARE_PROVIDER_SITE_OTHER): Payer: Medicare Other | Admitting: Cardiovascular Disease

## 2021-08-10 ENCOUNTER — Other Ambulatory Visit: Payer: Self-pay

## 2021-08-10 ENCOUNTER — Encounter: Payer: Self-pay | Admitting: Cardiovascular Disease

## 2021-08-10 VITALS — BP 126/74 | HR 89 | Ht 64.0 in | Wt 155.6 lb

## 2021-08-10 DIAGNOSIS — I340 Nonrheumatic mitral (valve) insufficiency: Secondary | ICD-10-CM

## 2021-08-10 DIAGNOSIS — R011 Cardiac murmur, unspecified: Secondary | ICD-10-CM

## 2021-08-10 NOTE — Patient Instructions (Signed)
Medication Instructions:  NO CHANGES *If you need a refill on your cardiac medications before your next appointment, please call your pharmacy*   Lab Work: NONE If you have labs (blood work) drawn today and your tests are completely normal, you will receive your results only by: [place] (if you have MyChart) OR A paper copy in the mail If you have any lab test that is abnormal or we need to change your treatment, we will call you to review the results.   Testing/Procedures: NONE   Follow-Up: At [facility], you and your health needs are our priority.  As part of our continuing mission to provide you with exceptional heart care, we have created designated Provider Care Teams.  These Care Teams include your primary Cardiologist (physician) and Advanced Practice Providers (APPs -  Physician Assistants and Nurse Practitioners) who all work together to provide you with the care you need, when you need it.  We recommend signing up for the patient portal called "MyChart".  Sign up information is provided on this After Visit Summary.  MyChart is used to connect with patients for Virtual Visits (Telemedicine).  Patients are able to view lab/test results, encounter notes, upcoming appointments, etc.  Non-urgent messages can be sent to your provider as well.   To learn more about what you can do with MyChart, go to [email or web address].    Your next appointment:   12 month(s)  The format for your next appointment:   In Person  Provider:   [facility]   or Robbie Lis, PA-C, Melina Copa, PA-C, Cecilie Kicks, NP, Ermalinda Barrios, PA-C, Christen Bame, NP, or Richardson Dopp, PA-C         Other Instructions NONE

## 2021-08-10 NOTE — Progress Notes (Signed)
Cardiology Office Note:    Date:  08/10/2021   ID:  ALEJANDRO ADCOX, DOB [birth year], MRN [id number]  PCP:  Ginger Organ., MD   [facility] Providers Cardiologist:   Kathrynn Humble to update primary MD,subspecialty MD or APP then REFRESH:1}    Referring MD: Ginger Organ., MD   Chief Complaint  Patient presents with   Hyperlipidemia           Dec. 7, 2022   Brittany Washington is a 70 y.o. female with a hx of hypertension, hypothyroidism, heart murmur, hyperlipidemia.  I last saw her in May, 2017.  She returns today to reestablish care.  Broke her R shoulder in Sept. - fell while carrying potted plants , also had an orbital fracture, and hairline fracture in her left elbow .   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     Past Medical History:  Diagnosis Date   Benign hypertensive heart disease without heart failure    GERD (gastroesophageal reflux disease)    Heart murmur, systolic    MECHNICAL   Hypertension    mild, lost weight, no meds now   Hypothyroidism    Osteoporosis    Primary osteoarthritis of right hip 06/18/2018    Past Surgical History:  Procedure Laterality Date   APPENDECTOMY     BACK SURGERY     BIOPSY  03/10/2019   Procedure: BIOPSY;  Surgeon: Milus Banister, MD;  Location: [facility] ENDOSCOPY;  Service: Endoscopy;;   COLONOSCOPY     ESOPHAGOGASTRODUODENOSCOPY N/A 03/10/2019   Procedure: ESOPHAGOGASTRODUODENOSCOPY (EGD);  Surgeon: Milus Banister, MD;  Location: [facility] ENDOSCOPY;  Service: Endoscopy;  Laterality: N/A;   FRACTURE SURGERY     HAMMER TOE SURGERY Left 07/14/2013   Procedure: HAMMER TOE CORRECTION LEFT SECOND TOE, [place]/AKIN LEFT;  Surgeon: [name], DPM;  Location: [place];  Service: Podiatry;  Laterality: Left;   JOINT REPLACEMENT     LAPAROSCOPIC APPENDECTOMY N/A 12/29/2016   Procedure: APPENDECTOMY  LAPAROSCOPIC;  Surgeon: Excell Seltzer, MD;  Location: [facility];  Service: General;  Laterality: N/A;   [place] DISKECTOMY W/ DISSECTION, LEFT L4 -- L5  02-23-2004   METATARSAL OSTEOTOMY Left 04/02/2015   Procedure: LEFT FOOT METATARSAL OSTEOTOMY;  Surgeon: [name], MD;  Location: [place];  Service: Orthopedics;  Laterality: Left;   METATARSAL OSTEOTOMY Left 08/18/2016   Procedure: METATARSAL OSTEOTOMY SECOND TOE;  Surgeon: [name], MD;  Location: [place];  Service: Orthopedics;  Laterality: Left;   ORIF LEFT DISTAL RADIAL FX W/ BONE GRAFT  01-14-2009   POSTERIOR LUMBAR FUSION  05-16-2005   L4 -- L5   RE-DO LUMBAR DISKECTOMY , L4  - L5  05-03-2004   REVERSE SHOULDER ARTHROPLASTY Right 05/23/2021   Procedure: REVERSE SHOULDER ARTHROPLASTY;  Surgeon: Hiram Gash, MD;  Location: [place];  Service: Orthopedics;  Laterality: Right;   TOTAL HIP ARTHROPLASTY Right 06/18/2018   TOTAL HIP ARTHROPLASTY Right 06/18/2018   Procedure: RIGHT TOTAL HIP ARTHROPLASTY ANTERIOR APPROACH;  Surgeon: [name], MD;  Location: [place];  Service: Orthopedics;  Laterality: Right;   TRIGGER FINGER RELEASE  01/09/2012   Procedure: RELEASE TRIGGER FINGER/A-1 PULLEY;  Surgeon: [name], MD;  Location: [place];  Service: Orthopedics;  Laterality: Left;   VAGINAL HYSTERECTOMY  1993   W/ BILATERAL SALPINGOOPHORECTOMY   WRIST FRACTURE SURGERY Left     Current Medications: Current Meds  Medication Sig   celecoxib (CELEBREX) 200 MG capsule Take 200 mg by mouth 2 (two) times daily.   cetirizine (ZYRTEC) 10 MG tablet Take 10 mg by mouth at bedtime.    Cholecalciferol (VITAMIN D3) 400 units CAPS Take 400 Units by mouth daily.   clonazePAM (KLONOPIN) 1 MG tablet Take 1 tablet (1 mg total) by mouth at bedtime. Take 1-2 tablets by mouth daily at bedtime as needed for sleep.   denosumab  (PROLIA) 60 MG/ML SOSY injection Inject 60 mg into the skin every 6 (six) months.   diphenhydrAMINE (BENADRYL) 25 MG tablet Take 25 mg by mouth at bedtime as needed for allergies.   gabapentin (NEURONTIN) 100 MG capsule Take 100 mg by mouth daily.   hydrochlorothiazide (MICROZIDE) 12.5 MG capsule TAKE 1 CAPSULE BY MOUTH DAILY.   levothyroxine (SYNTHROID, LEVOTHROID) 112 MCG tablet TAKE 1 TABLET BY MOUTH ONCE DAILY   methocarbamol (ROBAXIN) 500 MG tablet Take 500 mg by mouth at bedtime as needed for muscle spasms.   metoprolol tartrate (LOPRESSOR) 25 MG tablet Take 12.5 mg by mouth 2 (two) times daily.   ondansetron (ZOFRAN-ODT) 4 MG disintegrating tablet Take 4 mg by mouth every 6 (six) hours as needed.   pantoprazole (PROTONIX) 40 MG tablet Take 1 tablet (40 mg total) by mouth daily at 6 (six) AM.   Turmeric 500 MG CAPS Take by mouth.   valsartan (DIOVAN) 160 MG tablet Take 160 mg by mouth daily.     Allergies:   Sulfa antibiotics and Fosamax [alendronate]   Social History   Socioeconomic History   Marital status: Married    Spouse name: Not on file   Number of children: 1   Years of education: 14   Highest education level: Not on file  Occupational History   Occupation: RN  Tobacco Use   Smoking status: Never   Smokeless tobacco: Never  Vaping Use   Vaping Use: Never used  Substance and Sexual Activity   Alcohol use: Yes    Alcohol/week: 7.0 standard drinks    Types: 7 Glasses of wine per week    Comment: 1 WINE DAILY   Drug use: No   Sexual activity: Yes  Other Topics Concern   Not on file  Social History Narrative   Patient drinks about 2 cups of caffeine daily.   Patient is right handed.    Social Determinants of Health   Financial Resource Strain: Not on file  Food Insecurity: Not on file  Transportation Needs: Not on file  Physical Activity: Not on file  Stress: Not on file  Social Connections: Not on file     Family History: The patient's family history  includes Colon cancer in her father; Heart attack in her brother and father; Ovarian cancer in her mother. There is no history of Rectal cancer, Stomach cancer, or Breast cancer.  ROS:   Please see the history of present illness.     All other systems reviewed and are negative.  EKGs/Labs/Other Studies Reviewed:    The following studies were reviewed today:   EKG:  05/20/21:   NSR , no ST or T wave changes.   Recent Labs: 05/20/2021: BUN 14; Creatinine, Ser 1.23; Potassium 4.1; Sodium 131  Recent Lipid Panel    Component Value Date/Time   CHOL 211 (H) 01/24/2016 4403  TRIG 199 (H) 01/24/2016 0937   HDL 76 01/24/2016 0937   CHOLHDL 2.8 01/24/2016 0937   VLDL 40 (H) 01/24/2016 0937   LDLCALC 95 01/24/2016 0937   LDLDIRECT 78.9 01/21/2013 0822     Risk Assessment/Calculations:           Physical Exam:    VS:  BP 126/74 (BP Location: Left Arm, Patient Position: Sitting, Cuff Size: Normal)   Pulse 89   Ht 5\' 4"  (1.626 m)   Wt 155 lb 9.6 oz (70.6 kg)   SpO2 98%   BMI 26.71 kg/m     Wt Readings from Last 3 Encounters:  08/10/21 155 lb 9.6 oz (70.6 kg)  05/23/21 155 lb 6.8 oz (70.5 kg)  03/01/21 155 lb (70.3 kg)     GEN:  Well nourished, well developed in no acute distress HEENT: Normal NECK: No JVD; No carotid bruits LYMPHATICS: No lymphadenopathy CARDIAC: RRR,  soft systolic murmur  RESPIRATORY:  Clear to auscultation without rales, wheezing or rhonchi  ABDOMEN: Soft, non-tender, non-distended MUSCULOSKELETAL:  s/p r shoulder replacement .   Healing nicely,   joint mobility is improving .   SKIN: Warm and dry NEUROLOGIC:  Alert and oriented x 3 PSYCHIATRIC:  Normal affect   ASSESSMENT:    1. Nonrheumatic mitral valve regurgitation    PLAN:       Much regurgitation: Olla presents for further evaluation of her mitral regurgitation.  She has a very soft systolic murmur.  Murmur has not changed from her descriptions from previous exam.  I would not expect  her to have any long-term issues from this mitral valve.  Lab or see [place] again in 1 year for follow-up visit.  2.  Shoulder replacement:   continues to improve.      Medication Adjustments/Labs and Tests Ordered: Current medicines are reviewed at length with the patient today.  Concerns regarding medicines are outlined above.  No orders of the defined types were placed in this encounter.  No orders of the defined types were placed in this encounter.   Patient Instructions  Medication Instructions:  NO CHANGES *If you need a refill on your cardiac medications before your next appointment, please call your pharmacy*   Lab Work: NONE If you have labs (blood work) drawn today and your tests are completely normal, you will receive your results only by: [place] (if you have MyChart) OR A paper copy in the mail If you have any lab test that is abnormal or we need to change your treatment, we will call you to review the results.   Testing/Procedures: NONE   Follow-Up: At [facility], you and your health needs are our priority.  As part of our continuing mission to provide you with exceptional heart care, we have created designated Provider Care Teams.  These Care Teams include your primary Cardiologist (physician) and Advanced Practice Providers (APPs -  Physician Assistants and Nurse Practitioners) who all work together to provide you with the care you need, when you need it.  We recommend signing up for the patient portal called "MyChart".  Sign up information is provided on this After Visit Summary.  MyChart is used to connect with patients for Virtual Visits (Telemedicine).  Patients are able to view lab/test results, encounter notes, upcoming appointments, etc.  Non-urgent messages can be sent to your provider as well.   To learn more about what you can do with MyChart, go to [email or web address].    Your next appointment:  12 month(s)  The format for your next  appointment:   In Person  Provider:   DR [facility]   or Robbie Lis, PA-C, Melina Copa, PA-C, Cecilie Kicks, NP, Ermalinda Barrios, PA-C, Christen Bame, NP, or Richardson Dopp, PA-C         Other Instructions NONE    Signed, Mertie Moores, MD  08/10/2021 2:38 PM    [place]

## 2021-08-11 DIAGNOSIS — Z96611 Presence of right artificial shoulder joint: Secondary | ICD-10-CM | POA: Diagnosis not present

## 2021-08-11 DIAGNOSIS — S42201D Unspecified fracture of upper end of right humerus, subsequent encounter for fracture with routine healing: Secondary | ICD-10-CM | POA: Diagnosis not present

## 2021-08-11 DIAGNOSIS — M6281 Muscle weakness (generalized): Secondary | ICD-10-CM | POA: Diagnosis not present

## 2021-08-11 DIAGNOSIS — M25611 Stiffness of right shoulder, not elsewhere classified: Secondary | ICD-10-CM | POA: Diagnosis not present

## 2021-08-15 DIAGNOSIS — Z96611 Presence of right artificial shoulder joint: Secondary | ICD-10-CM | POA: Diagnosis not present

## 2021-08-15 DIAGNOSIS — S42201D Unspecified fracture of upper end of right humerus, subsequent encounter for fracture with routine healing: Secondary | ICD-10-CM | POA: Diagnosis not present

## 2021-08-15 DIAGNOSIS — M25611 Stiffness of right shoulder, not elsewhere classified: Secondary | ICD-10-CM | POA: Diagnosis not present

## 2021-08-15 DIAGNOSIS — M6281 Muscle weakness (generalized): Secondary | ICD-10-CM | POA: Diagnosis not present

## 2021-08-17 DIAGNOSIS — M6281 Muscle weakness (generalized): Secondary | ICD-10-CM | POA: Diagnosis not present

## 2021-08-17 DIAGNOSIS — M25611 Stiffness of right shoulder, not elsewhere classified: Secondary | ICD-10-CM | POA: Diagnosis not present

## 2021-08-17 DIAGNOSIS — Z96611 Presence of right artificial shoulder joint: Secondary | ICD-10-CM | POA: Diagnosis not present

## 2021-08-17 DIAGNOSIS — S42201D Unspecified fracture of upper end of right humerus, subsequent encounter for fracture with routine healing: Secondary | ICD-10-CM | POA: Diagnosis not present

## 2021-08-18 DIAGNOSIS — S42201D Unspecified fracture of upper end of right humerus, subsequent encounter for fracture with routine healing: Secondary | ICD-10-CM | POA: Diagnosis not present

## 2021-08-19 DIAGNOSIS — M25551 Pain in right hip: Secondary | ICD-10-CM | POA: Diagnosis not present

## 2021-08-22 DIAGNOSIS — S42201D Unspecified fracture of upper end of right humerus, subsequent encounter for fracture with routine healing: Secondary | ICD-10-CM | POA: Diagnosis not present

## 2021-08-22 DIAGNOSIS — Z96611 Presence of right artificial shoulder joint: Secondary | ICD-10-CM | POA: Diagnosis not present

## 2021-08-22 DIAGNOSIS — M6281 Muscle weakness (generalized): Secondary | ICD-10-CM | POA: Diagnosis not present

## 2021-08-22 DIAGNOSIS — M25611 Stiffness of right shoulder, not elsewhere classified: Secondary | ICD-10-CM | POA: Diagnosis not present

## 2021-08-24 DIAGNOSIS — M25611 Stiffness of right shoulder, not elsewhere classified: Secondary | ICD-10-CM | POA: Diagnosis not present

## 2021-08-24 DIAGNOSIS — S42201D Unspecified fracture of upper end of right humerus, subsequent encounter for fracture with routine healing: Secondary | ICD-10-CM | POA: Diagnosis not present

## 2021-08-24 DIAGNOSIS — Z96611 Presence of right artificial shoulder joint: Secondary | ICD-10-CM | POA: Diagnosis not present

## 2021-08-24 DIAGNOSIS — M6281 Muscle weakness (generalized): Secondary | ICD-10-CM | POA: Diagnosis not present

## 2021-08-29 DIAGNOSIS — Z20822 Contact with and (suspected) exposure to covid-19: Secondary | ICD-10-CM | POA: Diagnosis not present

## 2021-09-04 DIAGNOSIS — Z20822 Contact with and (suspected) exposure to covid-19: Secondary | ICD-10-CM | POA: Diagnosis not present

## 2021-09-06 ENCOUNTER — Other Ambulatory Visit: Payer: Self-pay | Admitting: Internal Medicine

## 2021-09-06 DIAGNOSIS — M9902 Segmental and somatic dysfunction of thoracic region: Secondary | ICD-10-CM | POA: Diagnosis not present

## 2021-09-06 DIAGNOSIS — M50322 Other cervical disc degeneration at C5-C6 level: Secondary | ICD-10-CM | POA: Diagnosis not present

## 2021-09-06 DIAGNOSIS — M9901 Segmental and somatic dysfunction of cervical region: Secondary | ICD-10-CM | POA: Diagnosis not present

## 2021-09-06 DIAGNOSIS — Z1231 Encounter for screening mammogram for malignant neoplasm of breast: Secondary | ICD-10-CM

## 2021-09-06 DIAGNOSIS — M5134 Other intervertebral disc degeneration, thoracic region: Secondary | ICD-10-CM | POA: Diagnosis not present

## 2021-09-07 DIAGNOSIS — Z96611 Presence of right artificial shoulder joint: Secondary | ICD-10-CM | POA: Diagnosis not present

## 2021-09-07 DIAGNOSIS — M25611 Stiffness of right shoulder, not elsewhere classified: Secondary | ICD-10-CM | POA: Diagnosis not present

## 2021-09-07 DIAGNOSIS — S42201D Unspecified fracture of upper end of right humerus, subsequent encounter for fracture with routine healing: Secondary | ICD-10-CM | POA: Diagnosis not present

## 2021-09-07 DIAGNOSIS — M6281 Muscle weakness (generalized): Secondary | ICD-10-CM | POA: Diagnosis not present

## 2021-09-09 DIAGNOSIS — S42201D Unspecified fracture of upper end of right humerus, subsequent encounter for fracture with routine healing: Secondary | ICD-10-CM | POA: Diagnosis not present

## 2021-09-09 DIAGNOSIS — M6281 Muscle weakness (generalized): Secondary | ICD-10-CM | POA: Diagnosis not present

## 2021-09-09 DIAGNOSIS — Z96611 Presence of right artificial shoulder joint: Secondary | ICD-10-CM | POA: Diagnosis not present

## 2021-09-09 DIAGNOSIS — M25611 Stiffness of right shoulder, not elsewhere classified: Secondary | ICD-10-CM | POA: Diagnosis not present

## 2021-09-12 DIAGNOSIS — M25611 Stiffness of right shoulder, not elsewhere classified: Secondary | ICD-10-CM | POA: Diagnosis not present

## 2021-09-12 DIAGNOSIS — Z96611 Presence of right artificial shoulder joint: Secondary | ICD-10-CM | POA: Diagnosis not present

## 2021-09-12 DIAGNOSIS — M6281 Muscle weakness (generalized): Secondary | ICD-10-CM | POA: Diagnosis not present

## 2021-09-12 DIAGNOSIS — M9902 Segmental and somatic dysfunction of thoracic region: Secondary | ICD-10-CM | POA: Diagnosis not present

## 2021-09-12 DIAGNOSIS — M9901 Segmental and somatic dysfunction of cervical region: Secondary | ICD-10-CM | POA: Diagnosis not present

## 2021-09-12 DIAGNOSIS — M50322 Other cervical disc degeneration at C5-C6 level: Secondary | ICD-10-CM | POA: Diagnosis not present

## 2021-09-12 DIAGNOSIS — S42201D Unspecified fracture of upper end of right humerus, subsequent encounter for fracture with routine healing: Secondary | ICD-10-CM | POA: Diagnosis not present

## 2021-09-12 DIAGNOSIS — M5134 Other intervertebral disc degeneration, thoracic region: Secondary | ICD-10-CM | POA: Diagnosis not present

## 2021-09-19 DIAGNOSIS — M25611 Stiffness of right shoulder, not elsewhere classified: Secondary | ICD-10-CM | POA: Diagnosis not present

## 2021-09-19 DIAGNOSIS — S42201D Unspecified fracture of upper end of right humerus, subsequent encounter for fracture with routine healing: Secondary | ICD-10-CM | POA: Diagnosis not present

## 2021-09-19 DIAGNOSIS — M6281 Muscle weakness (generalized): Secondary | ICD-10-CM | POA: Diagnosis not present

## 2021-09-19 DIAGNOSIS — Z96611 Presence of right artificial shoulder joint: Secondary | ICD-10-CM | POA: Diagnosis not present

## 2021-09-21 DIAGNOSIS — Z96611 Presence of right artificial shoulder joint: Secondary | ICD-10-CM | POA: Diagnosis not present

## 2021-09-21 DIAGNOSIS — M25611 Stiffness of right shoulder, not elsewhere classified: Secondary | ICD-10-CM | POA: Diagnosis not present

## 2021-09-21 DIAGNOSIS — S42201D Unspecified fracture of upper end of right humerus, subsequent encounter for fracture with routine healing: Secondary | ICD-10-CM | POA: Diagnosis not present

## 2021-09-21 DIAGNOSIS — M6281 Muscle weakness (generalized): Secondary | ICD-10-CM | POA: Diagnosis not present

## 2021-09-26 ENCOUNTER — Ambulatory Visit
Admission: RE | Admit: 2021-09-26 | Discharge: 2021-09-26 | Disposition: A | Discharge status: Home / Self Care | Payer: Medicare Other | Source: Ambulatory Visit | Attending: Internal Medicine | Admitting: Internal Medicine

## 2021-09-26 DIAGNOSIS — Z1231 Encounter for screening mammogram for malignant neoplasm of breast: Secondary | ICD-10-CM

## 2021-09-28 DIAGNOSIS — M6281 Muscle weakness (generalized): Secondary | ICD-10-CM | POA: Diagnosis not present

## 2021-09-28 DIAGNOSIS — Z96611 Presence of right artificial shoulder joint: Secondary | ICD-10-CM | POA: Diagnosis not present

## 2021-09-28 DIAGNOSIS — S42201D Unspecified fracture of upper end of right humerus, subsequent encounter for fracture with routine healing: Secondary | ICD-10-CM | POA: Diagnosis not present

## 2021-09-28 DIAGNOSIS — M25611 Stiffness of right shoulder, not elsewhere classified: Secondary | ICD-10-CM | POA: Diagnosis not present

## 2021-09-30 ENCOUNTER — Other Ambulatory Visit (HOSPITAL_COMMUNITY): Payer: Self-pay | Admitting: *Deleted

## 2021-10-03 ENCOUNTER — Other Ambulatory Visit: Payer: Self-pay

## 2021-10-03 ENCOUNTER — Ambulatory Visit (HOSPITAL_COMMUNITY)
Admission: RE | Admit: 2021-10-03 | Discharge: 2021-10-03 | Disposition: A | Discharge status: Home / Self Care | Payer: Medicare Other | Source: Ambulatory Visit | Attending: Internal Medicine | Admitting: Internal Medicine

## 2021-10-03 DIAGNOSIS — M810 Age-related osteoporosis without current pathological fracture: Secondary | ICD-10-CM | POA: Insufficient documentation

## 2021-10-03 DIAGNOSIS — E785 Hyperlipidemia, unspecified: Secondary | ICD-10-CM | POA: Diagnosis not present

## 2021-10-03 DIAGNOSIS — I10 Essential (primary) hypertension: Secondary | ICD-10-CM | POA: Diagnosis not present

## 2021-10-03 DIAGNOSIS — E039 Hypothyroidism, unspecified: Secondary | ICD-10-CM | POA: Diagnosis not present

## 2021-10-03 MED ORDER — DENOSUMAB 60 MG/ML ~~LOC~~ SOSY
60.0000 mg | PREFILLED_SYRINGE | Freq: Once | SUBCUTANEOUS | Status: AC
  Administered 2021-10-03: 60 mg via SUBCUTANEOUS

## 2021-10-03 MED ORDER — DENOSUMAB 60 MG/ML ~~LOC~~ SOSY
PREFILLED_SYRINGE | SUBCUTANEOUS | Status: AC
  Filled 2021-10-03: qty 1

## 2021-10-05 DIAGNOSIS — S42201D Unspecified fracture of upper end of right humerus, subsequent encounter for fracture with routine healing: Secondary | ICD-10-CM | POA: Diagnosis not present

## 2021-10-05 DIAGNOSIS — Z96611 Presence of right artificial shoulder joint: Secondary | ICD-10-CM | POA: Diagnosis not present

## 2021-10-05 DIAGNOSIS — M6281 Muscle weakness (generalized): Secondary | ICD-10-CM | POA: Diagnosis not present

## 2021-10-05 DIAGNOSIS — M25611 Stiffness of right shoulder, not elsewhere classified: Secondary | ICD-10-CM | POA: Diagnosis not present

## 2021-10-07 DIAGNOSIS — R7301 Impaired fasting glucose: Secondary | ICD-10-CM | POA: Diagnosis not present

## 2021-10-07 DIAGNOSIS — E039 Hypothyroidism, unspecified: Secondary | ICD-10-CM | POA: Diagnosis not present

## 2021-10-07 DIAGNOSIS — Z1339 Encounter for screening examination for other mental health and behavioral disorders: Secondary | ICD-10-CM | POA: Diagnosis not present

## 2021-10-07 DIAGNOSIS — Z800 Family history of malignant neoplasm of digestive organs: Secondary | ICD-10-CM | POA: Diagnosis not present

## 2021-10-07 DIAGNOSIS — Z0000 Encounter for general adult medical examination without abnormal findings: Secondary | ICD-10-CM | POA: Diagnosis not present

## 2021-10-07 DIAGNOSIS — M810 Age-related osteoporosis without current pathological fracture: Secondary | ICD-10-CM | POA: Diagnosis not present

## 2021-10-07 DIAGNOSIS — I10 Essential (primary) hypertension: Secondary | ICD-10-CM | POA: Diagnosis not present

## 2021-10-07 DIAGNOSIS — K760 Fatty (change of) liver, not elsewhere classified: Secondary | ICD-10-CM | POA: Diagnosis not present

## 2021-10-07 DIAGNOSIS — Z1331 Encounter for screening for depression: Secondary | ICD-10-CM | POA: Diagnosis not present

## 2021-10-07 DIAGNOSIS — R82998 Other abnormal findings in urine: Secondary | ICD-10-CM | POA: Diagnosis not present

## 2021-10-07 DIAGNOSIS — E785 Hyperlipidemia, unspecified: Secondary | ICD-10-CM | POA: Diagnosis not present

## 2021-10-10 ENCOUNTER — Other Ambulatory Visit: Payer: Self-pay | Admitting: Internal Medicine

## 2021-10-10 DIAGNOSIS — M810 Age-related osteoporosis without current pathological fracture: Secondary | ICD-10-CM

## 2021-10-12 DIAGNOSIS — M25611 Stiffness of right shoulder, not elsewhere classified: Secondary | ICD-10-CM | POA: Diagnosis not present

## 2021-10-12 DIAGNOSIS — Z96611 Presence of right artificial shoulder joint: Secondary | ICD-10-CM | POA: Diagnosis not present

## 2021-10-12 DIAGNOSIS — M6281 Muscle weakness (generalized): Secondary | ICD-10-CM | POA: Diagnosis not present

## 2021-10-12 DIAGNOSIS — S42201D Unspecified fracture of upper end of right humerus, subsequent encounter for fracture with routine healing: Secondary | ICD-10-CM | POA: Diagnosis not present

## 2021-10-17 DIAGNOSIS — Z96611 Presence of right artificial shoulder joint: Secondary | ICD-10-CM | POA: Diagnosis not present

## 2021-10-17 DIAGNOSIS — M6281 Muscle weakness (generalized): Secondary | ICD-10-CM | POA: Diagnosis not present

## 2021-10-17 DIAGNOSIS — S42201D Unspecified fracture of upper end of right humerus, subsequent encounter for fracture with routine healing: Secondary | ICD-10-CM | POA: Diagnosis not present

## 2021-10-17 DIAGNOSIS — M25611 Stiffness of right shoulder, not elsewhere classified: Secondary | ICD-10-CM | POA: Diagnosis not present

## 2021-10-23 DIAGNOSIS — Z20822 Contact with and (suspected) exposure to covid-19: Secondary | ICD-10-CM | POA: Diagnosis not present

## 2021-10-26 DIAGNOSIS — Z96611 Presence of right artificial shoulder joint: Secondary | ICD-10-CM | POA: Diagnosis not present

## 2021-10-26 DIAGNOSIS — M25611 Stiffness of right shoulder, not elsewhere classified: Secondary | ICD-10-CM | POA: Diagnosis not present

## 2021-10-26 DIAGNOSIS — M6281 Muscle weakness (generalized): Secondary | ICD-10-CM | POA: Diagnosis not present

## 2021-10-26 DIAGNOSIS — S42201D Unspecified fracture of upper end of right humerus, subsequent encounter for fracture with routine healing: Secondary | ICD-10-CM | POA: Diagnosis not present

## 2021-10-27 DIAGNOSIS — Z20822 Contact with and (suspected) exposure to covid-19: Secondary | ICD-10-CM | POA: Diagnosis not present

## 2021-10-31 DIAGNOSIS — M6281 Muscle weakness (generalized): Secondary | ICD-10-CM | POA: Diagnosis not present

## 2021-10-31 DIAGNOSIS — Z96611 Presence of right artificial shoulder joint: Secondary | ICD-10-CM | POA: Diagnosis not present

## 2021-10-31 DIAGNOSIS — S42201D Unspecified fracture of upper end of right humerus, subsequent encounter for fracture with routine healing: Secondary | ICD-10-CM | POA: Diagnosis not present

## 2021-10-31 DIAGNOSIS — M25611 Stiffness of right shoulder, not elsewhere classified: Secondary | ICD-10-CM | POA: Diagnosis not present

## 2021-11-12 DIAGNOSIS — Z20822 Contact with and (suspected) exposure to covid-19: Secondary | ICD-10-CM | POA: Diagnosis not present

## 2021-11-15 DIAGNOSIS — Z1159 Encounter for screening for other viral diseases: Secondary | ICD-10-CM | POA: Diagnosis not present

## 2021-12-02 DIAGNOSIS — Z20822 Contact with and (suspected) exposure to covid-19: Secondary | ICD-10-CM | POA: Diagnosis not present

## 2021-12-04 DIAGNOSIS — Z1159 Encounter for screening for other viral diseases: Secondary | ICD-10-CM | POA: Diagnosis not present

## 2021-12-12 DIAGNOSIS — Z20822 Contact with and (suspected) exposure to covid-19: Secondary | ICD-10-CM | POA: Diagnosis not present

## 2021-12-13 DIAGNOSIS — Z20822 Contact with and (suspected) exposure to covid-19: Secondary | ICD-10-CM | POA: Diagnosis not present

## 2021-12-19 DIAGNOSIS — Z20822 Contact with and (suspected) exposure to covid-19: Secondary | ICD-10-CM | POA: Diagnosis not present

## 2022-01-04 DIAGNOSIS — U071 COVID-19: Secondary | ICD-10-CM | POA: Diagnosis not present

## 2022-01-06 DIAGNOSIS — Z20822 Contact with and (suspected) exposure to covid-19: Secondary | ICD-10-CM | POA: Diagnosis not present

## 2022-01-09 DIAGNOSIS — Z20822 Contact with and (suspected) exposure to covid-19: Secondary | ICD-10-CM | POA: Diagnosis not present

## 2022-01-11 DIAGNOSIS — Z20822 Contact with and (suspected) exposure to covid-19: Secondary | ICD-10-CM | POA: Diagnosis not present

## 2022-01-31 DIAGNOSIS — H0011 Chalazion right upper eyelid: Secondary | ICD-10-CM | POA: Diagnosis not present

## 2022-03-14 DIAGNOSIS — M9901 Segmental and somatic dysfunction of cervical region: Secondary | ICD-10-CM | POA: Diagnosis not present

## 2022-03-14 DIAGNOSIS — M50323 Other cervical disc degeneration at C6-C7 level: Secondary | ICD-10-CM | POA: Diagnosis not present

## 2022-03-14 DIAGNOSIS — M9902 Segmental and somatic dysfunction of thoracic region: Secondary | ICD-10-CM | POA: Diagnosis not present

## 2022-03-14 DIAGNOSIS — M5134 Other intervertebral disc degeneration, thoracic region: Secondary | ICD-10-CM | POA: Diagnosis not present

## 2022-03-16 DIAGNOSIS — M9902 Segmental and somatic dysfunction of thoracic region: Secondary | ICD-10-CM | POA: Diagnosis not present

## 2022-03-16 DIAGNOSIS — M9901 Segmental and somatic dysfunction of cervical region: Secondary | ICD-10-CM | POA: Diagnosis not present

## 2022-03-16 DIAGNOSIS — M5134 Other intervertebral disc degeneration, thoracic region: Secondary | ICD-10-CM | POA: Diagnosis not present

## 2022-03-16 DIAGNOSIS — M50323 Other cervical disc degeneration at C6-C7 level: Secondary | ICD-10-CM | POA: Diagnosis not present

## 2022-03-20 DIAGNOSIS — M9901 Segmental and somatic dysfunction of cervical region: Secondary | ICD-10-CM | POA: Diagnosis not present

## 2022-03-20 DIAGNOSIS — M5134 Other intervertebral disc degeneration, thoracic region: Secondary | ICD-10-CM | POA: Diagnosis not present

## 2022-03-20 DIAGNOSIS — M50323 Other cervical disc degeneration at C6-C7 level: Secondary | ICD-10-CM | POA: Diagnosis not present

## 2022-03-20 DIAGNOSIS — M9902 Segmental and somatic dysfunction of thoracic region: Secondary | ICD-10-CM | POA: Diagnosis not present

## 2022-03-23 DIAGNOSIS — E785 Hyperlipidemia, unspecified: Secondary | ICD-10-CM | POA: Diagnosis not present

## 2022-03-23 DIAGNOSIS — E039 Hypothyroidism, unspecified: Secondary | ICD-10-CM | POA: Diagnosis not present

## 2022-03-23 DIAGNOSIS — M810 Age-related osteoporosis without current pathological fracture: Secondary | ICD-10-CM | POA: Diagnosis not present

## 2022-03-23 DIAGNOSIS — K219 Gastro-esophageal reflux disease without esophagitis: Secondary | ICD-10-CM | POA: Diagnosis not present

## 2022-03-23 DIAGNOSIS — I10 Essential (primary) hypertension: Secondary | ICD-10-CM | POA: Diagnosis not present

## 2022-03-23 DIAGNOSIS — F5101 Primary insomnia: Secondary | ICD-10-CM | POA: Diagnosis not present

## 2022-03-29 ENCOUNTER — Ambulatory Visit
Admission: RE | Admit: 2022-03-29 | Discharge: 2022-03-29 | Disposition: A | Discharge status: Home / Self Care | Payer: Medicare Other | Source: Ambulatory Visit | Attending: Internal Medicine | Admitting: Internal Medicine

## 2022-03-29 DIAGNOSIS — M810 Age-related osteoporosis without current pathological fracture: Secondary | ICD-10-CM | POA: Diagnosis not present

## 2022-03-29 DIAGNOSIS — Z780 Asymptomatic menopausal state: Secondary | ICD-10-CM | POA: Diagnosis not present

## 2022-03-31 ENCOUNTER — Other Ambulatory Visit (HOSPITAL_COMMUNITY): Payer: Self-pay | Admitting: *Deleted

## 2022-04-03 ENCOUNTER — Ambulatory Visit (HOSPITAL_COMMUNITY)
Admission: RE | Admit: 2022-04-03 | Discharge: 2022-04-03 | Disposition: A | Discharge status: Home / Self Care | Payer: Medicare Other | Source: Ambulatory Visit | Attending: Internal Medicine | Admitting: Internal Medicine

## 2022-04-03 DIAGNOSIS — M810 Age-related osteoporosis without current pathological fracture: Secondary | ICD-10-CM | POA: Insufficient documentation

## 2022-04-03 MED ORDER — DENOSUMAB 60 MG/ML ~~LOC~~ SOSY
PREFILLED_SYRINGE | SUBCUTANEOUS | Status: AC
  Administered 2022-04-03: 60 mg via SUBCUTANEOUS
  Filled 2022-04-03: qty 1

## 2022-04-03 MED ORDER — DENOSUMAB 60 MG/ML ~~LOC~~ SOSY: 60.0000 mg | PREFILLED_SYRINGE | Freq: Once | SUBCUTANEOUS | Status: DC

## 2022-04-11 DIAGNOSIS — H2513 Age-related nuclear cataract, bilateral: Secondary | ICD-10-CM | POA: Diagnosis not present

## 2022-04-11 DIAGNOSIS — H43813 Vitreous degeneration, bilateral: Secondary | ICD-10-CM | POA: Diagnosis not present

## 2022-04-11 DIAGNOSIS — H5213 Myopia, bilateral: Secondary | ICD-10-CM | POA: Diagnosis not present

## 2022-06-02 DIAGNOSIS — S42201D Unspecified fracture of upper end of right humerus, subsequent encounter for fracture with routine healing: Secondary | ICD-10-CM | POA: Diagnosis not present

## 2022-06-04 ENCOUNTER — Inpatient Hospital Stay (HOSPITAL_COMMUNITY)
Admission: EM | Admit: 2022-06-04 | Discharge: 2022-06-06 | DRG: 378 | Disposition: A | Discharge status: Home / Self Care | Payer: Medicare Other | Attending: Internal Medicine | Admitting: Internal Medicine

## 2022-06-04 ENCOUNTER — Encounter (HOSPITAL_COMMUNITY): Payer: Self-pay

## 2022-06-04 DIAGNOSIS — Z79899 Other long term (current) drug therapy: Secondary | ICD-10-CM

## 2022-06-04 DIAGNOSIS — K920 Hematemesis: Secondary | ICD-10-CM | POA: Diagnosis not present

## 2022-06-04 DIAGNOSIS — D62 Acute posthemorrhagic anemia: Secondary | ICD-10-CM | POA: Diagnosis present

## 2022-06-04 DIAGNOSIS — R7401 Elevation of levels of liver transaminase levels: Secondary | ICD-10-CM | POA: Diagnosis not present

## 2022-06-04 DIAGNOSIS — Z882 Allergy status to sulfonamides status: Secondary | ICD-10-CM

## 2022-06-04 DIAGNOSIS — Z8249 Family history of ischemic heart disease and other diseases of the circulatory system: Secondary | ICD-10-CM

## 2022-06-04 DIAGNOSIS — Z90710 Acquired absence of both cervix and uterus: Secondary | ICD-10-CM

## 2022-06-04 DIAGNOSIS — R7989 Other specified abnormal findings of blood chemistry: Secondary | ICD-10-CM

## 2022-06-04 DIAGNOSIS — I119 Hypertensive heart disease without heart failure: Secondary | ICD-10-CM | POA: Diagnosis present

## 2022-06-04 DIAGNOSIS — Z8041 Family history of malignant neoplasm of ovary: Secondary | ICD-10-CM

## 2022-06-04 DIAGNOSIS — Z9049 Acquired absence of other specified parts of digestive tract: Secondary | ICD-10-CM

## 2022-06-04 DIAGNOSIS — K219 Gastro-esophageal reflux disease without esophagitis: Secondary | ICD-10-CM | POA: Diagnosis present

## 2022-06-04 DIAGNOSIS — K2971 Gastritis, unspecified, with bleeding: Principal | ICD-10-CM | POA: Diagnosis present

## 2022-06-04 DIAGNOSIS — G8929 Other chronic pain: Secondary | ICD-10-CM | POA: Diagnosis present

## 2022-06-04 DIAGNOSIS — K2931 Chronic superficial gastritis with bleeding: Secondary | ICD-10-CM

## 2022-06-04 DIAGNOSIS — E871 Hypo-osmolality and hyponatremia: Secondary | ICD-10-CM | POA: Diagnosis present

## 2022-06-04 DIAGNOSIS — M810 Age-related osteoporosis without current pathological fracture: Secondary | ICD-10-CM | POA: Diagnosis present

## 2022-06-04 DIAGNOSIS — I10 Essential (primary) hypertension: Secondary | ICD-10-CM

## 2022-06-04 DIAGNOSIS — F1010 Alcohol abuse, uncomplicated: Secondary | ICD-10-CM | POA: Diagnosis present

## 2022-06-04 DIAGNOSIS — Z800 Family history of malignant neoplasm of digestive organs: Secondary | ICD-10-CM

## 2022-06-04 DIAGNOSIS — Z96641 Presence of right artificial hip joint: Secondary | ICD-10-CM | POA: Diagnosis present

## 2022-06-04 DIAGNOSIS — E039 Hypothyroidism, unspecified: Secondary | ICD-10-CM | POA: Diagnosis present

## 2022-06-04 DIAGNOSIS — Z79890 Hormone replacement therapy (postmenopausal): Secondary | ICD-10-CM

## 2022-06-04 DIAGNOSIS — K760 Fatty (change of) liver, not elsewhere classified: Secondary | ICD-10-CM | POA: Diagnosis present

## 2022-06-04 DIAGNOSIS — Z96611 Presence of right artificial shoulder joint: Secondary | ICD-10-CM | POA: Diagnosis present

## 2022-06-04 DIAGNOSIS — D519 Vitamin B12 deficiency anemia, unspecified: Secondary | ICD-10-CM | POA: Diagnosis present

## 2022-06-04 DIAGNOSIS — D7589 Other specified diseases of blood and blood-forming organs: Secondary | ICD-10-CM | POA: Diagnosis present

## 2022-06-04 DIAGNOSIS — E876 Hypokalemia: Secondary | ICD-10-CM | POA: Diagnosis present

## 2022-06-04 LAB — CBC WITH DIFFERENTIAL/PLATELET
Abs Immature Granulocytes: 0.01 10*3/uL (ref 0.00–0.07)
Basophils Absolute: 0.1 10*3/uL (ref 0.0–0.1)
Basophils Relative: 1 %
Eosinophils Absolute: 0 10*3/uL (ref 0.0–0.5)
Eosinophils Relative: 0 %
HCT: 39 % (ref 36.0–46.0)
Hemoglobin: 13.7 g/dL (ref 12.0–15.0)
Immature Granulocytes: 0 %
Lymphocytes Relative: 24 %
Lymphs Abs: 1.4 10*3/uL (ref 0.7–4.0)
MCH: 36.4 pg — ABNORMAL HIGH (ref 26.0–34.0)
MCHC: 35.1 g/dL (ref 30.0–36.0)
MCV: 103.7 fL — ABNORMAL HIGH (ref 80.0–100.0)
Monocytes Absolute: 0.5 10*3/uL (ref 0.1–1.0)
Monocytes Relative: 8 %
Neutro Abs: 4 10*3/uL (ref 1.7–7.7)
Neutrophils Relative %: 67 %
Platelets: 193 10*3/uL (ref 150–400)
RBC: 3.76 MIL/uL — ABNORMAL LOW (ref 3.87–5.11)
RDW: 12.3 % (ref 11.5–15.5)
WBC: 5.9 10*3/uL (ref 4.0–10.5)
nRBC: 0 % (ref 0.0–0.2)

## 2022-06-04 LAB — COMPREHENSIVE METABOLIC PANEL
ALT: 94 U/L — ABNORMAL HIGH (ref 0–44)
AST: 124 U/L — ABNORMAL HIGH (ref 15–41)
Albumin: 4.5 g/dL (ref 3.5–5.0)
Alkaline Phosphatase: 87 U/L (ref 38–126)
Anion gap: 16 — ABNORMAL HIGH (ref 5–15)
BUN: 19 mg/dL (ref 8–23)
CO2: 19 mmol/L — ABNORMAL LOW (ref 22–32)
Calcium: 9.3 mg/dL (ref 8.9–10.3)
Chloride: 97 mmol/L — ABNORMAL LOW (ref 98–111)
Creatinine, Ser: 1.11 mg/dL — ABNORMAL HIGH (ref 0.44–1.00)
GFR, Estimated: 54 mL/min — ABNORMAL LOW (ref >60)
Glucose, Bld: 110 mg/dL — ABNORMAL HIGH (ref 70–99)
Potassium: 3.7 mmol/L (ref 3.5–5.1)
Sodium: 132 mmol/L — ABNORMAL LOW (ref 135–145)
Total Bilirubin: 0.6 mg/dL (ref 0.3–1.2)
Total Protein: 7.6 g/dL (ref 6.5–8.1)

## 2022-06-04 LAB — APTT: aPTT: 32 seconds (ref 24–36)

## 2022-06-04 LAB — PROTIME-INR
INR: 1.1 (ref 0.8–1.2)
Prothrombin Time: 14.5 seconds (ref 11.4–15.2)

## 2022-06-04 LAB — TYPE AND SCREEN
ABO/RH(D): A POS
Antibody Screen: NEGATIVE

## 2022-06-04 MED ORDER — THIAMINE MONONITRATE 100 MG PO TABS
100.0000 mg | ORAL_TABLET | Freq: Every day | ORAL | Status: DC
  Administered 2022-06-04 – 2022-06-06 (×2): 100 mg via ORAL
  Filled 2022-06-04 (×2): qty 1

## 2022-06-04 MED ORDER — THIAMINE HCL 100 MG/ML IJ SOLN
100.0000 mg | Freq: Every day | INTRAMUSCULAR | Status: DC
  Administered 2022-06-05: 100 mg via INTRAVENOUS
  Filled 2022-06-04: qty 2

## 2022-06-04 MED ORDER — PANTOPRAZOLE SODIUM 40 MG IV SOLR: 40.0000 mg | Freq: Two times a day (BID) | INTRAVENOUS | Status: DC

## 2022-06-04 MED ORDER — ONDANSETRON HCL 4 MG/2ML IJ SOLN: 4.0000 mg | Freq: Four times a day (QID) | INTRAMUSCULAR | Status: DC | PRN

## 2022-06-04 MED ORDER — MORPHINE SULFATE (PF) 2 MG/ML IV SOLN
1.0000 mg | INTRAVENOUS | Status: DC | PRN
  Administered 2022-06-04 – 2022-06-06 (×9): 1 mg via INTRAVENOUS
  Filled 2022-06-04 (×9): qty 1

## 2022-06-04 MED ORDER — SODIUM CHLORIDE 0.9 % IV SOLN: INTRAVENOUS | Status: AC

## 2022-06-04 MED ORDER — LORAZEPAM 1 MG PO TABS
1.0000 mg | ORAL_TABLET | ORAL | Status: DC | PRN
  Administered 2022-06-06: 1 mg via ORAL
  Filled 2022-06-04: qty 1

## 2022-06-04 MED ORDER — PANTOPRAZOLE SODIUM 40 MG IV SOLR
40.0000 mg | Freq: Once | INTRAVENOUS | Status: AC
  Administered 2022-06-04: 40 mg via INTRAVENOUS
  Filled 2022-06-04: qty 10

## 2022-06-04 MED ORDER — ONDANSETRON HCL 4 MG/2ML IJ SOLN
4.0000 mg | Freq: Once | INTRAMUSCULAR | Status: AC
  Administered 2022-06-04: 4 mg via INTRAVENOUS
  Filled 2022-06-04: qty 2

## 2022-06-04 MED ORDER — ONDANSETRON 8 MG PO TBDP
8.0000 mg | ORAL_TABLET | Freq: Once | ORAL | Status: AC
  Administered 2022-06-04: 8 mg via ORAL
  Filled 2022-06-04: qty 1

## 2022-06-04 MED ORDER — FOLIC ACID 1 MG PO TABS
1.0000 mg | ORAL_TABLET | Freq: Every day | ORAL | Status: DC
  Administered 2022-06-04 – 2022-06-06 (×3): 1 mg via ORAL
  Filled 2022-06-04 (×3): qty 1

## 2022-06-04 MED ORDER — ADULT MULTIVITAMIN W/MINERALS CH
1.0000 | ORAL_TABLET | Freq: Every day | ORAL | Status: DC
  Administered 2022-06-04 – 2022-06-06 (×3): 1 via ORAL
  Filled 2022-06-04 (×3): qty 1

## 2022-06-04 MED ORDER — PANTOPRAZOLE INFUSION (NEW) - SIMPLE MED
8.0000 mg/h | INTRAVENOUS | Status: DC
  Administered 2022-06-04 – 2022-06-05 (×3): 8 mg/h via INTRAVENOUS
  Filled 2022-06-04 (×3): qty 80

## 2022-06-04 MED ORDER — LORAZEPAM 2 MG/ML IJ SOLN: 1.0000 mg | INTRAMUSCULAR | Status: DC | PRN

## 2022-06-04 MED ORDER — CLONAZEPAM 0.5 MG PO TABS
0.5000 mg | ORAL_TABLET | Freq: Every evening | ORAL | Status: DC | PRN
  Administered 2022-06-04 – 2022-06-05 (×2): 0.5 mg via ORAL
  Filled 2022-06-04 (×2): qty 1

## 2022-06-04 NOTE — ED Notes (Signed)
Pt called out requesting nausea medication. PA notified.

## 2022-06-04 NOTE — ED Provider Notes (Signed)
[facility] DEPT Provider Note   CSN: [id number] Arrival date & time: 06/04/22  1141     History  Chief Complaint  Patient presents with   Hematemesis    Brittany Washington is a 71 y.o. female with medical history of hypertension, GERD.  Patient presents to the ED for evaluation of hematemesis.  Patient reports that this morning while making her bed with her husband she had 5 episodes of bright red blood in her vomit.  The patient states she has also had darker stool than normal for the last 3 days.  The patient denies any abdominal pain, fevers, diarrhea.  Patient has a history of hematemesis in 2020.  The patient had EGD done at this time and her hematemesis was attributed to her EtOH use in the setting of NSAID therapy.  Patient reports that she is compliant on once daily Protonix however has not taken any Protonix today.  Patient denies any lightheadedness, dizziness or weakness.  Patient denies any shortness of breath.  HPI     Home Medications Prior to Admission medications   Medication Sig Start Date End Date Taking? Authorizing Provider  celecoxib (CELEBREX) 200 MG capsule Take 200 mg by mouth 2 (two) times daily. 07/17/21   [provider]  cetirizine (ZYRTEC) 10 MG tablet Take 10 mg by mouth at bedtime.     [provider]  Cholecalciferol (VITAMIN D3) 400 units CAPS Take 400 Units by mouth daily.    [provider]  clonazePAM (KLONOPIN) 1 MG tablet Take 1 tablet (1 mg total) by mouth at bedtime. Take 1-2 tablets by mouth daily at bedtime as needed for sleep. 12/20/15   Nahser, [place]onda Cheng, MD  denosumab (PROLIA) 60 MG/ML SOSY injection Inject 60 mg into the skin every 6 (six) months.    [provider]  diphenhydrAMINE (BENADRYL) 25 MG tablet Take 25 mg by mouth at bedtime as needed for allergies.    [provider]  gabapentin (NEURONTIN) 100 MG capsule Take 100 mg by mouth daily. 07/07/21   [provider]  hydrochlorothiazide (MICROZIDE) 12.5 MG capsule TAKE 1 CAPSULE BY MOUTH DAILY. 09/21/15   Darlin Coco, MD  levothyroxine (SYNTHROID, LEVOTHROID) 112 MCG tablet TAKE 1 TABLET BY MOUTH ONCE DAILY 10/19/14   Lendon Colonel, NP  methocarbamol (ROBAXIN) 500 MG tablet Take 500 mg by mouth at bedtime as needed for muscle spasms.    [provider]  metoprolol tartrate (LOPRESSOR) 25 MG tablet Take 12.5 mg by mouth 2 (two) times daily.    [provider]  ondansetron (ZOFRAN-ODT) 4 MG disintegrating tablet Take 4 mg by mouth every 6 (six) hours as needed. 03/30/21   [provider]  pantoprazole (PROTONIX) 40 MG tablet Take 1 tablet (40 mg total) by mouth daily at 6 (six) AM. 03/11/19   Black, Lezlie Octave, NP  Turmeric 500 MG CAPS Take by mouth.    [provider]  valsartan (DIOVAN) 160 MG tablet Take 160 mg by mouth daily. 09/26/18   [provider]      Allergies    Sulfa antibiotics and Fosamax [alendronate]    Review of Systems   Review of Systems  Respiratory:  Negative for shortness of breath.   Gastrointestinal:  Positive for blood in stool, nausea and vomiting. Negative for abdominal pain and diarrhea.  Neurological:  Negative for dizziness, weakness and light-headedness.  All other systems reviewed and are negative.   Physical Exam Updated Vital  Signs BP (!) 156/85 (BP Location: Right Arm)   Pulse 87   Temp 97.7 F (36.5 C) (Oral)   Resp 18   SpO2 97%  Physical Exam Vitals and nursing note reviewed.  Constitutional:      General: She is not in acute distress.    Appearance: Normal appearance. She is not ill-appearing, toxic-appearing or diaphoretic.  HENT:     Head: Normocephalic and atraumatic.     Nose: Nose normal. No congestion.     Mouth/Throat:     Mouth: Mucous membranes are moist.     Pharynx: Oropharynx is clear.  Eyes:     Extraocular Movements: Extraocular movements intact.     Conjunctiva/sclera:  Conjunctivae normal.     Pupils: Pupils are equal, round, and reactive to light.  Cardiovascular:     Rate and Rhythm: Normal rate and regular rhythm.  Pulmonary:     Effort: Pulmonary effort is normal.     Breath sounds: Normal breath sounds. No wheezing.  Abdominal:     General: Abdomen is flat. Bowel sounds are normal.     Palpations: Abdomen is soft.     Tenderness: There is no abdominal tenderness.  Musculoskeletal:     Cervical back: Normal range of motion and neck supple. No tenderness.  Skin:    General: Skin is warm and dry.     Capillary Refill: Capillary refill takes less than 2 seconds.  Neurological:     General: No focal deficit present.     Mental Status: She is alert and oriented to person, place, and time.     GCS: GCS eye subscore is 4. GCS verbal subscore is 5. GCS motor subscore is 6.     Cranial Nerves: Cranial nerves 2-12 are intact. No cranial nerve deficit.     Sensory: Sensation is intact. No sensory deficit.     Motor: Motor function is intact. No weakness.     Coordination: Coordination is intact. Heel to [facility] Test normal.     ED Results / Procedures / Treatments   Labs (all labs ordered are listed, but only abnormal results are displayed) Labs Reviewed  COMPREHENSIVE METABOLIC PANEL - Abnormal; Notable for the following components:      Result Value   Sodium 132 (*)    Chloride 97 (*)    CO2 19 (*)    Glucose, Bld 110 (*)    Creatinine, Ser 1.11 (*)    AST 124 (*)    ALT 94 (*)    GFR, Estimated 54 (*)    Anion gap 16 (*)    All other components within normal limits  CBC WITH DIFFERENTIAL/PLATELET - Abnormal; Notable for the following components:   RBC 3.76 (*)    MCV 103.7 (*)    MCH 36.4 (*)    All other components within normal limits  PROTIME-INR  APTT  HEMOGLOBIN AND HEMATOCRIT, BLOOD  TYPE AND SCREEN    EKG None  Radiology No results found.  Procedures Procedures   Medications Ordered in ED Medications  ondansetron  (ZOFRAN) injection 4 mg (has no administration in time range)  pantoprozole (PROTONIX) 80 mg /NS 100 mL infusion (has no administration in time range)  morphine (PF) 2 MG/ML injection 1 mg (has no administration in time range)  0.9 %  sodium chloride infusion (has no administration in time range)  ondansetron (ZOFRAN-ODT) disintegrating tablet 8 mg (8 mg Oral Given 06/04/22 1232)  pantoprazole (PROTONIX) injection 40 mg (40 mg Intravenous Given  06/04/22 1257)  ondansetron (ZOFRAN) injection 4 mg (4 mg Intravenous Given 06/04/22 1408)    ED Course/ Medical Decision Making/ A&P                           Medical Decision Making Risk Prescription drug management. Decision regarding hospitalization.   71 year old female presents to the ED for evaluation.  Please see HPI for further details.  On my examination patient afebrile, nontachycardic.  Patient lung sounds clear bilaterally, she is not hypoxic.  The patient abdomen is soft and compressible throughout.  Patient nontoxic in appearance.  Patient resting comfortably in bed.  The following labs were collected due to the patient complaint: CBC, CMP, APTT, PT/INR, type and screen.  Patient will be given 40 mg Protonix, 4 mg Zofran.  Patient hemoglobin stable at 13.7.  The patient CMP is at baseline for creatinine however the patient does have elevated AST and ALT.  The patient denies excessive alcohol use.  Patient anion gap elevated at 16.  Patient PT/INR, APTT within normal limits.  Patient has history of same complaint.  Patient has been seen by [facility] in the past.  I paged on-call PA for [place], Nicoletta Ba, who advised this patient to be admitted and they will come and see the patient today with plan for EGD tomorrow morning.  Patient is amenable to the plan.    Hospitalist paged for admission.  Dr. [place] Pink has agreed to admit the patient for further management.  Patient stable at this time.  Final Clinical Impression(s) / ED  Diagnoses Final diagnoses:  Hematemesis with nausea    Rx / DC Orders ED Discharge Orders     None         Azucena Cecil, PA-C 06/04/22 1521    Davonna Belling, MD 06/05/22 1453

## 2022-06-04 NOTE — ED Provider Triage Note (Signed)
Emergency Medicine Provider Triage Evaluation Note  Brittany Washington , a 71 y.o. female  was evaluated in triage.  Pt complains of hematemesis.  Patient states she had 5 episodes of bright red bloody vomit after awakening this morning.  She notes history of similar occurrence back in 2020 where she had a bleeding gastric ulcer.  Has not had an EGD since then.  Says she has been avoiding NSAIDs and is not sure what prompted this episode.  She reported some feelings of midthoracic back pain prior to emesis but has not had back pain since.  Denies fever, chills, night sweats, abdominal pain, chest pain, shortness of breath.  She states she has felt nauseated since the incident..  Review of Systems  Positive: See above Negative:   Physical Exam  BP 129/87 (BP Location: Right Arm)   Pulse 80   Temp 98 F (36.7 C) (Oral)   Resp 16   SpO2 99%  Gen:   Awake, no distress   Resp:  Normal effort  MSK:   Moves extremities without difficulty  Other:  No abdominal tenderness on exam.  No thoracic back tenderness or pain was stated to be earlier.  Medical Decision Making  Medically screening exam initiated at 12:23 PM.  Appropriate orders placed.  Tresa Res was informed that the remainder of the evaluation will be completed by another provider, this initial triage assessment does not replace that evaluation, and the importance of remaining in the ED until their evaluation is complete.     Wilnette Kales, [place] 06/04/22 1224

## 2022-06-04 NOTE — ED Triage Notes (Signed)
Pt presents with c/o hematemesis. Pt reports that she began coughing up bright red blood this morning. Pt reports hx of same in 2020 when she had a GI bleed.

## 2022-06-04 NOTE — Consult Note (Addendum)
Consultation  Referring Provider: ER MD Elvina Sidle Elie Confer Primary Care Physician:  Charlane Ferretti, MD Primary Gastroenterologist:  Dr. Henrene Pastor  Reason for Consultation: Hematemesis dark stools  HPI: Brittany Washington is a 71 y.o. female, with history of GERD, on chronic PPI, hypertension, hypothyroidism, and several previous orthopedic procedures. Patient presents to the emergency room today after abrupt onset of nausea followed by 5 episodes of what she describes as projectile vomiting of dark red blood, she had been feeling fine over the past couple of days but had noticed that her stools were darker than usual, no overt blood.  She has been able to eat over the past week without any difficulty, no dysphagia or odynophagia, no complaints of abdominal pain.  She takes Protonix for GERD which controls her symptoms.  Denies any aspirin or NSAID use.  Does drink 2 glasses of wine daily.  No recent new medications vitamins or supplements. Patient hemodynamically stable on arrival.  No further nausea or vomiting over the past 4 hours.  Initial labs hemoglobin 13.7/hematocrit 39/MCV 103/platelets 193 BUN 19/creatinine 1.1 LFTs show AST of 124/ALT 94 INR 1.1  Patient had previous abdominal ultrasound in 2020 showing hepatic steatosis, status postcholecystectomy. She underwent EGD in July 2020 for complaints of hematemesis per Dr. Ardis Hughs.  She was found to have mild diffuse gastritis with erythema granularity and friability.  Biopsies showed mild reactive gastropathy no H. Pylori  She has had prior colonoscopies with Dr. Henrene Pastor with family history of colon cancer in her father.  Last colonoscopy June 2022 with one 3 mm polyp removed and otherwise negative exam Path showed a tubular adenomatous polyp.   Past Medical History:  Diagnosis Date   Benign hypertensive heart disease without heart failure    GERD (gastroesophageal reflux disease)    Heart murmur, systolic    MECHNICAL   Hypertension     mild, lost weight, no meds now   Hypothyroidism    Osteoporosis    Primary osteoarthritis of right hip 06/18/2018    Past Surgical History:  Procedure Laterality Date   APPENDECTOMY     BACK SURGERY     BIOPSY  03/10/2019   Procedure: BIOPSY;  Surgeon: Milus Banister, MD;  Location: [facility] ENDOSCOPY;  Service: Endoscopy;;   COLONOSCOPY     ESOPHAGOGASTRODUODENOSCOPY N/A 03/10/2019   Procedure: ESOPHAGOGASTRODUODENOSCOPY (EGD);  Surgeon: Milus Banister, MD;  Location: [facility] ENDOSCOPY;  Service: Endoscopy;  Laterality: N/A;   FRACTURE SURGERY     HAMMER TOE SURGERY Left 07/14/2013   Procedure: HAMMER TOE CORRECTION LEFT SECOND TOE, AUSTIN/AKIN LEFT;  Surgeon: [name], DPM;  Location: [place];  Service: Podiatry;  Laterality: Left;   JOINT REPLACEMENT     LAPAROSCOPIC APPENDECTOMY N/A 12/29/2016   Procedure: APPENDECTOMY LAPAROSCOPIC;  Surgeon: Excell Seltzer, MD;  Location: [facility];  Service: General;  Laterality: N/A;   Gregory DISKECTOMY W/ DISSECTION, LEFT L4 -- L5  02-23-2004   METATARSAL OSTEOTOMY Left 04/02/2015   Procedure: LEFT FOOT METATARSAL OSTEOTOMY;  Surgeon: [name], MD;  Location: [place];  Service: Orthopedics;  Laterality: Left;   METATARSAL OSTEOTOMY Left 08/18/2016   Procedure: METATARSAL OSTEOTOMY SECOND TOE;  Surgeon: [name], MD;  Location: [place];  Service: Orthopedics;  Laterality: Left;   ORIF LEFT DISTAL RADIAL FX W/ BONE GRAFT  01-14-2009   POSTERIOR LUMBAR FUSION  05-16-2005   L4 -- L5  RE-DO LUMBAR DISKECTOMY , L4  - L5  05-03-2004   REVERSE SHOULDER ARTHROPLASTY Right 05/23/2021   Procedure: REVERSE SHOULDER ARTHROPLASTY;  Surgeon: Hiram Gash, MD;  Location: [place];  Service: Orthopedics;  Laterality: Right;   TOTAL HIP ARTHROPLASTY Right 06/18/2018   TOTAL HIP ARTHROPLASTY Right 06/18/2018   Procedure: RIGHT TOTAL HIP  ARTHROPLASTY ANTERIOR APPROACH;  Surgeon: [name], MD;  Location: [place];  Service: Orthopedics;  Laterality: Right;   TRIGGER FINGER RELEASE  01/09/2012   Procedure: RELEASE TRIGGER FINGER/A-1 PULLEY;  Surgeon: [name], MD;  Location: [place];  Service: Orthopedics;  Laterality: Left;   VAGINAL HYSTERECTOMY  1993   W/ BILATERAL SALPINGOOPHORECTOMY   WRIST FRACTURE SURGERY Left     Prior to Admission medications   Medication Sig Start Date End Date Taking? Authorizing Provider  celecoxib (CELEBREX) 200 MG capsule Take 200 mg by mouth 2 (two) times daily. 07/17/21   [provider]  cetirizine (ZYRTEC) 10 MG tablet Take 10 mg by mouth at bedtime.     [provider]  Cholecalciferol (VITAMIN D3) 400 units CAPS Take 400 Units by mouth daily.    [provider]  clonazePAM (KLONOPIN) 1 MG tablet Take 1 tablet (1 mg total) by mouth at bedtime. Take 1-2 tablets by mouth daily at bedtime as needed for sleep. 12/20/15   Nahser, Wonda Cheng, MD  denosumab (PROLIA) 60 MG/ML SOSY injection Inject 60 mg into the skin every 6 (six) months.    [provider]  diphenhydrAMINE (BENADRYL) 25 MG tablet Take 25 mg by mouth at bedtime as needed for allergies.    [provider]  gabapentin (NEURONTIN) 100 MG capsule Take 100 mg by mouth daily. 07/07/21   [provider]  hydrochlorothiazide (MICROZIDE) 12.5 MG capsule TAKE 1 CAPSULE BY MOUTH DAILY. 09/21/15   Darlin Coco, MD  levothyroxine (SYNTHROID, LEVOTHROID) 112 MCG tablet TAKE 1 TABLET BY MOUTH ONCE DAILY 10/19/14   Lendon Colonel, NP  methocarbamol (ROBAXIN) 500 MG tablet Take 500 mg by mouth at bedtime as needed for muscle spasms.    [provider]  metoprolol tartrate (LOPRESSOR) 25 MG tablet Take 12.5 mg by mouth 2 (two) times daily.    [provider]  ondansetron (ZOFRAN-ODT) 4 MG disintegrating tablet Take 4 mg by mouth every 6 (six) hours  as needed. 03/30/21   [provider]  pantoprazole (PROTONIX) 40 MG tablet Take 1 tablet (40 mg total) by mouth daily at 6 (six) AM. 03/11/19   Black, Lezlie Octave, NP  Turmeric 500 MG CAPS Take by mouth.    [provider]  valsartan (DIOVAN) 160 MG tablet Take 160 mg by mouth daily. 09/26/18   [provider]    Current Facility-Administered Medications  Medication Dose Route Frequency Provider Last Rate Last Admin   0.9 %  sodium chloride infusion   Intravenous Continuous Bonnielee Haff, MD       morphine (PF) 2 MG/ML injection 1 mg  1 mg Intravenous Q3H PRN Bonnielee Haff, MD       ondansetron [facility]) injection 4 mg  4 mg Intravenous Q6H PRN Bonnielee Haff, MD       pantoprozole (PROTONIX) 80 mg /NS 100 mL infusion  8 mg/hr Intravenous Continuous Bonnielee Haff, MD       Current Outpatient Medications  Medication Sig Dispense Refill   celecoxib (CELEBREX) 200 MG capsule Take 200 mg by mouth 2 (two) times  daily.     cetirizine (ZYRTEC) 10 MG tablet Take 10 mg by mouth at bedtime.      Cholecalciferol (VITAMIN D3) 400 units CAPS Take 400 Units by mouth daily.     clonazePAM (KLONOPIN) 1 MG tablet Take 1 tablet (1 mg total) by mouth at bedtime. Take 1-2 tablets by mouth daily at bedtime as needed for sleep. 15 tablet 0   denosumab (PROLIA) 60 MG/ML SOSY injection Inject 60 mg into the skin every 6 (six) months.     diphenhydrAMINE (BENADRYL) 25 MG tablet Take 25 mg by mouth at bedtime as needed for allergies.     gabapentin (NEURONTIN) 100 MG capsule Take 100 mg by mouth daily.     hydrochlorothiazide (MICROZIDE) 12.5 MG capsule TAKE 1 CAPSULE BY MOUTH DAILY. 30 capsule 9   levothyroxine (SYNTHROID, LEVOTHROID) 112 MCG tablet TAKE 1 TABLET BY MOUTH ONCE DAILY 90 tablet PRN   methocarbamol (ROBAXIN) 500 MG tablet Take 500 mg by mouth at bedtime as needed for muscle spasms.     metoprolol tartrate (LOPRESSOR) 25 MG tablet Take 12.5 mg by mouth 2 (two) times daily.      ondansetron (ZOFRAN-ODT) 4 MG disintegrating tablet Take 4 mg by mouth every 6 (six) hours as needed.     pantoprazole (PROTONIX) 40 MG tablet Take 1 tablet (40 mg total) by mouth daily at 6 (six) AM. 30 tablet 1   Turmeric 500 MG CAPS Take by mouth.     valsartan (DIOVAN) 160 MG tablet Take 160 mg by mouth daily.      Allergies as of 06/04/2022 - Review Complete 06/04/2022  Allergen Reaction Noted   Sulfa antibiotics Diarrhea and Other (See Comments) 06/13/2011   Fosamax [alendronate] Other (See Comments) 09/06/2020    Family History  Problem Relation Age of Onset   Heart attack Father    Colon cancer Father    Heart attack Brother    Ovarian cancer Mother    Rectal cancer Neg Hx    Stomach cancer Neg Hx    Breast cancer Neg Hx     Social History   Socioeconomic History   Marital status: Married    Spouse name: Not on file   Number of children: 1   Years of education: 14   Highest education level: Not on file  Occupational History   Occupation: Therapist, sports  Tobacco Use   Smoking status: Never   Smokeless tobacco: Never  Vaping Use   Vaping Use: Never used  Substance and Sexual Activity   Alcohol use: Yes    Alcohol/week: 7.0 standard drinks of alcohol    Types: 7 Glasses of wine per week    Comment: 1 WINE DAILY   Drug use: No   Sexual activity: Yes  Other Topics Concern   Not on file  Social History Narrative   Patient drinks about 2 cups of caffeine daily.   Patient is right handed.    Social Determinants of Health   Financial Resource Strain: Not on file  Food Insecurity: Not on file  Transportation Needs: Not on file  Physical Activity: Not on file  Stress: Not on file  Social Connections: Not on file  Intimate Partner Violence: Not on file    Review of Systems: Pertinent positive and negative review of systems were noted in the above HPI section.  All other review of systems was otherwise negative.   Physical Exam: Vital signs in last 24 hours: Temp:   [97.7 F (36.5 C)-98  F (36.7 C)] 97.7 F (36.5 C) (10/01 1403) Pulse Rate:  [76-87] 87 (10/01 1403) Resp:  [16-18] 18 (10/01 1403) BP: (129-156)/(85-87) 156/85 (10/01 1403) SpO2:  [97 %-99 %] 97 % (10/01 1403)   General:   Alert,  Well-developed, well-nourished, older white female pleasant and cooperative in NAD, husband at bedside Head:  Normocephalic and atraumatic.  Some erythema of cheeks, with telangiectasia Eyes:  Sclera clear, no icterus.   Conjunctiva pink. Ears:  Normal auditory acuity. Nose:  No deformity, discharge,  or lesions. Mouth:  No deformity or lesions.   Neck:  Supple; no masses or thyromegaly. Lungs:  Clear throughout to auscultation.   No wheezes, crackles, or rhonchi. Heart:  Regular rate and rhythm; no murmurs, clicks, rubs,  or gallops. Abdomen:  Soft,nontender, BS active,nonpalp mass or hsm.   Rectal: Not done Msk:  Symmetrical without gross deformities. . Pulses:  Normal pulses noted. Extremities:  Without clubbing or edema.  Mild palmar erythema Neurologic:  Alert and  oriented x4;  grossly normal neurologically, slightly tremulous Skin:  Intact without significant lesions or rashes.. Psych:  Alert and cooperative. Normal mood and affect.  Intake/Output from previous day: No intake/output data recorded. Intake/Output this shift: No intake/output data recorded.  Lab Results: Recent Labs    06/04/22 1253  WBC 5.9  HGB 13.7  HCT 39.0  PLT 193   BMET Recent Labs    06/04/22 1253  NA 132*  K 3.7  CL 97*  CO2 19*  GLUCOSE 110*  BUN 19  CREATININE 1.11*  CALCIUM 9.3   LFT Recent Labs    06/04/22 1253  PROT 7.6  ALBUMIN 4.5  AST 124*  ALT 94*  ALKPHOS 87  BILITOT 0.6   PT/INR Recent Labs    06/04/22 1253  LABPROT 14.5  INR 1.1   Hepatitis Panel No results for input(s): "HEPBSAG", "HCVAB", "HEPAIGM", "HEPBIGM" in the last 72 hours.   IMPRESSION:  #80 71 year old white female with hematemesis x5 episodes this morning, and  in retrospect noting darker stools over the past 2 to 3 days.  Patient has prior history of diffuse gastritis on EGD 2020 No NSAID use/positive EtOH use  Etiology of hematemesis not clear-rule out possible Mallory-Weiss tear, peptic ulcer disease, diffuse gastropathy,  Patient is currently hemodynamically stable and hemoglobin 13.7 on admission  #2 family history of colon cancer, and personal history of adenomatous colon polyps-up-to-date with colonoscopies last done June 2022  #3 mild transaminitis-probably EtOH related #4 previously documented hepatic steatosis  #6 status post cholecystectomy #7 upper back pain today onset before nausea and vomiting #8 status post several orthopedic procedures including shoulder replacement  #9 history of hypertension  PLAN: Clear liquids today, n.p.o. after midnight IV PPI infusion has been started Serial hemoglobins every 6 hours, transfuse for hemoglobin 8 or less Patient will be scheduled for EGD tomorrow with Dr. Fuller Plan.  Procedure was discussed in detail with the patient including indications risk benefits and she is agreeable to proceed.  GI will follow with you.  (Of note patient's husband/Dr. Pearlie Oyster is actually scheduled for stent removal with Dr. Rush Landmark at 730 tomorrow morning- biliary stent.  He has asked that this procedure be canceled)   Amy Esterwood PA-C 06/04/2022, 3:21 PM   I have taken an interval history, thoroughly reviewed the chart and examined the patient. I agree with the Advanced Practitioner's note, impression and recommendations, and have recorded additional findings, impressions and recommendations below. I performed a substantive portion of this  encounter (>50% time spent), including a complete performance of the medical decision making.  My additional thoughts are as follows:  Acute onset vomiting and hematemesis.  No further bleeding since arrival in ED, initial hemoglobin normal. She had a similar episode  in 2022 when she was taking ibuprofen, but says she no longer uses NSAIDs. She has a typical 2:1 ratio of transaminitis, and I suspect she consumes more alcohol than reported.  However, she has no clinical stigmata of portal hypertension and this seems unlikely to be variceal bleeding.  Probably Mallory-Weiss tear, less likely peptic ulcer.  She is being admitted for overnight observation, serial hemoglobin and hematocrit, IV PPI, n.p.o. except ice chips and EGD with Dr. Fuller Plan tomorrow.  Please call GI service before then if there is evidence of brisk GI bleeding.  Nelida Meuse III Office:[phone]

## 2022-06-04 NOTE — H&P (View-Only) (Signed)
Consultation  Referring Provider: ER MD Elvina Sidle Elie Confer Primary Care Physician:  Charlane Ferretti, MD Primary Gastroenterologist:  Dr. Henrene Pastor  Reason for Consultation: Hematemesis dark stools  HPI: Brittany Washington is a 71 y.o. female, with history of GERD, on chronic PPI, hypertension, hypothyroidism, and several previous orthopedic procedures. Patient presents to the emergency room today after abrupt onset of nausea followed by 5 episodes of what she describes as projectile vomiting of dark red blood, she had been feeling fine over the past couple of days but had noticed that her stools were darker than usual, no overt blood.  She has been able to eat over the past week without any difficulty, no dysphagia or odynophagia, no complaints of abdominal pain.  She takes Protonix for GERD which controls her symptoms.  Denies any aspirin or NSAID use.  Does drink 2 glasses of wine daily.  No recent new medications vitamins or supplements. Patient hemodynamically stable on arrival.  No further nausea or vomiting over the past 4 hours.  Initial labs hemoglobin 13.7/hematocrit 39/MCV 103/platelets 193 BUN 19/creatinine 1.1 LFTs show AST of 124/ALT 94 INR 1.1  Patient had previous abdominal ultrasound in 2020 showing hepatic steatosis, status postcholecystectomy. She underwent EGD in July 2020 for complaints of hematemesis per Dr. Ardis Hughs.  She was found to have mild diffuse gastritis with erythema granularity and friability.  Biopsies showed mild reactive gastropathy no H. Pylori  She has had prior colonoscopies with Dr. Henrene Pastor with family history of colon cancer in her father.  Last colonoscopy June 2022 with one 3 mm polyp removed and otherwise negative exam Path showed a tubular adenomatous polyp.   Past Medical History:  Diagnosis Date   Benign hypertensive heart disease without heart failure    GERD (gastroesophageal reflux disease)    Heart murmur, systolic    MECHNICAL   Hypertension     mild, lost weight, no meds now   Hypothyroidism    Osteoporosis    Primary osteoarthritis of right hip 06/18/2018    Past Surgical History:  Procedure Laterality Date   APPENDECTOMY     BACK SURGERY     BIOPSY  03/10/2019   Procedure: BIOPSY;  Surgeon: Milus Banister, MD;  Location: [facility] ENDOSCOPY;  Service: Endoscopy;;   COLONOSCOPY     ESOPHAGOGASTRODUODENOSCOPY N/A 03/10/2019   Procedure: ESOPHAGOGASTRODUODENOSCOPY (EGD);  Surgeon: Milus Banister, MD;  Location: [facility] ENDOSCOPY;  Service: Endoscopy;  Laterality: N/A;   FRACTURE SURGERY     HAMMER TOE SURGERY Left 07/14/2013   Procedure: HAMMER TOE CORRECTION LEFT SECOND TOE, [place]/AKIN LEFT;  Surgeon: [name], DPM;  Location: [place];  Service: Podiatry;  Laterality: Left;   JOINT REPLACEMENT     LAPAROSCOPIC APPENDECTOMY N/A 12/29/2016   Procedure: APPENDECTOMY LAPAROSCOPIC;  Surgeon: Excell Seltzer, MD;  Location: [facility] ORS;  Service: General;  Laterality: N/A;   [place] DISKECTOMY W/ DISSECTION, LEFT L4 -- L5  02-23-2004   METATARSAL OSTEOTOMY Left 04/02/2015   Procedure: LEFT FOOT METATARSAL OSTEOTOMY;  Surgeon: [name], MD;  Location: [place];  Service: Orthopedics;  Laterality: Left;   METATARSAL OSTEOTOMY Left 08/18/2016   Procedure: METATARSAL OSTEOTOMY SECOND TOE;  Surgeon: [name], MD;  Location: [place];  Service: Orthopedics;  Laterality: Left;   ORIF LEFT DISTAL RADIAL FX W/ BONE GRAFT  01-14-2009   POSTERIOR LUMBAR FUSION  05-16-2005   L4 -- L5  RE-DO LUMBAR DISKECTOMY , L4  - L5  05-03-2004   REVERSE SHOULDER ARTHROPLASTY Right 05/23/2021   Procedure: REVERSE SHOULDER ARTHROPLASTY;  Surgeon: Hiram Gash, MD;  Location: [place];  Service: Orthopedics;  Laterality: Right;   TOTAL HIP ARTHROPLASTY Right 06/18/2018   TOTAL HIP ARTHROPLASTY Right 06/18/2018   Procedure: RIGHT TOTAL HIP  ARTHROPLASTY ANTERIOR APPROACH;  Surgeon: [name], MD;  Location: [place];  Service: Orthopedics;  Laterality: Right;   TRIGGER FINGER RELEASE  01/09/2012   Procedure: RELEASE TRIGGER FINGER/A-1 PULLEY;  Surgeon: [name], MD;  Location: [place];  Service: Orthopedics;  Laterality: Left;   VAGINAL HYSTERECTOMY  1993   W/ BILATERAL SALPINGOOPHORECTOMY   WRIST FRACTURE SURGERY Left     Prior to Admission medications   Medication Sig Start Date End Date Taking? Authorizing Provider  celecoxib (CELEBREX) 200 MG capsule Take 200 mg by mouth 2 (two) times daily. 07/17/21   [provider]  cetirizine (ZYRTEC) 10 MG tablet Take 10 mg by mouth at bedtime.     [provider]  Cholecalciferol (VITAMIN D3) 400 units CAPS Take 400 Units by mouth daily.    [provider]  clonazePAM (KLONOPIN) 1 MG tablet Take 1 tablet (1 mg total) by mouth at bedtime. Take 1-2 tablets by mouth daily at bedtime as needed for sleep. 12/20/15   Nahser, Wonda Cheng, MD  denosumab (PROLIA) 60 MG/ML SOSY injection Inject 60 mg into the skin every 6 (six) months.    [provider]  diphenhydrAMINE (BENADRYL) 25 MG tablet Take 25 mg by mouth at bedtime as needed for allergies.    [provider]  gabapentin (NEURONTIN) 100 MG capsule Take 100 mg by mouth daily. 07/07/21   [provider]  hydrochlorothiazide (MICROZIDE) 12.5 MG capsule TAKE 1 CAPSULE BY MOUTH DAILY. 09/21/15   Darlin Coco, MD  levothyroxine (SYNTHROID, LEVOTHROID) 112 MCG tablet TAKE 1 TABLET BY MOUTH ONCE DAILY 10/19/14   Lendon Colonel, NP  methocarbamol (ROBAXIN) 500 MG tablet Take 500 mg by mouth at bedtime as needed for muscle spasms.    [provider]  metoprolol tartrate (LOPRESSOR) 25 MG tablet Take 12.5 mg by mouth 2 (two) times daily.    [provider]  ondansetron (ZOFRAN-ODT) 4 MG disintegrating tablet Take 4 mg by mouth every 6 (six) hours  as needed. 03/30/21   [provider]  pantoprazole (PROTONIX) 40 MG tablet Take 1 tablet (40 mg total) by mouth daily at 6 (six) AM. 03/11/19   Black, Lezlie Octave, NP  Turmeric 500 MG CAPS Take by mouth.    [provider]  valsartan (DIOVAN) 160 MG tablet Take 160 mg by mouth daily. 09/26/18   [provider]    Current Facility-Administered Medications  Medication Dose Route Frequency Provider Last Rate Last Admin   0.9 %  sodium chloride infusion   Intravenous Continuous Bonnielee Haff, MD       morphine (PF) 2 MG/ML injection 1 mg  1 mg Intravenous Q3H PRN Bonnielee Haff, MD       ondansetron [facility]) injection 4 mg  4 mg Intravenous Q6H PRN Bonnielee Haff, MD       pantoprozole (PROTONIX) 80 mg /NS 100 mL infusion  8 mg/hr Intravenous Continuous Bonnielee Haff, MD       Current Outpatient Medications  Medication Sig Dispense Refill   celecoxib (CELEBREX) 200 MG capsule Take 200 mg by mouth 2 (two) times  daily.     cetirizine (ZYRTEC) 10 MG tablet Take 10 mg by mouth at bedtime.      Cholecalciferol (VITAMIN D3) 400 units CAPS Take 400 Units by mouth daily.     clonazePAM (KLONOPIN) 1 MG tablet Take 1 tablet (1 mg total) by mouth at bedtime. Take 1-2 tablets by mouth daily at bedtime as needed for sleep. 15 tablet 0   denosumab (PROLIA) 60 MG/ML SOSY injection Inject 60 mg into the skin every 6 (six) months.     diphenhydrAMINE (BENADRYL) 25 MG tablet Take 25 mg by mouth at bedtime as needed for allergies.     gabapentin (NEURONTIN) 100 MG capsule Take 100 mg by mouth daily.     hydrochlorothiazide (MICROZIDE) 12.5 MG capsule TAKE 1 CAPSULE BY MOUTH DAILY. 30 capsule 9   levothyroxine (SYNTHROID, LEVOTHROID) 112 MCG tablet TAKE 1 TABLET BY MOUTH ONCE DAILY 90 tablet PRN   methocarbamol (ROBAXIN) 500 MG tablet Take 500 mg by mouth at bedtime as needed for muscle spasms.     metoprolol tartrate (LOPRESSOR) 25 MG tablet Take 12.5 mg by mouth 2 (two) times daily.      ondansetron (ZOFRAN-ODT) 4 MG disintegrating tablet Take 4 mg by mouth every 6 (six) hours as needed.     pantoprazole (PROTONIX) 40 MG tablet Take 1 tablet (40 mg total) by mouth daily at 6 (six) AM. 30 tablet 1   Turmeric 500 MG CAPS Take by mouth.     valsartan (DIOVAN) 160 MG tablet Take 160 mg by mouth daily.      Allergies as of 06/04/2022 - Review Complete 06/04/2022  Allergen Reaction Noted   Sulfa antibiotics Diarrhea and Other (See Comments) 06/13/2011   Fosamax [alendronate] Other (See Comments) 09/06/2020    Family History  Problem Relation Age of Onset   Heart attack Father    Colon cancer Father    Heart attack Brother    Ovarian cancer Mother    Rectal cancer Neg Hx    Stomach cancer Neg Hx    Breast cancer Neg Hx     Social History   Socioeconomic History   Marital status: Married    Spouse name: Not on file   Number of children: 1   Years of education: 14   Highest education level: Not on file  Occupational History   Occupation: Therapist, sports  Tobacco Use   Smoking status: Never   Smokeless tobacco: Never  Vaping Use   Vaping Use: Never used  Substance and Sexual Activity   Alcohol use: Yes    Alcohol/week: 7.0 standard drinks of alcohol    Types: 7 Glasses of wine per week    Comment: 1 WINE DAILY   Drug use: No   Sexual activity: Yes  Other Topics Concern   Not on file  Social History Narrative   Patient drinks about 2 cups of caffeine daily.   Patient is right handed.    Social Determinants of Health   Financial Resource Strain: Not on file  Food Insecurity: Not on file  Transportation Needs: Not on file  Physical Activity: Not on file  Stress: Not on file  Social Connections: Not on file  Intimate Partner Violence: Not on file    Review of Systems: Pertinent positive and negative review of systems were noted in the above HPI section.  All other review of systems was otherwise negative.   Physical Exam: Vital signs in last 24 hours: Temp:   [97.7 F (36.5 C)-98  F (36.7 C)] 97.7 F (36.5 C) (10/01 1403) Pulse Rate:  [76-87] 87 (10/01 1403) Resp:  [16-18] 18 (10/01 1403) BP: (129-156)/(85-87) 156/85 (10/01 1403) SpO2:  [97 %-99 %] 97 % (10/01 1403)   General:   Alert,  Well-developed, well-nourished, older white female pleasant and cooperative in NAD, husband at bedside Head:  Normocephalic and atraumatic.  Some erythema of cheeks, with telangiectasia Eyes:  Sclera clear, no icterus.   Conjunctiva pink. Ears:  Normal auditory acuity. Nose:  No deformity, discharge,  or lesions. Mouth:  No deformity or lesions.   Neck:  Supple; no masses or thyromegaly. Lungs:  Clear throughout to auscultation.   No wheezes, crackles, or rhonchi. Heart:  Regular rate and rhythm; no murmurs, clicks, rubs,  or gallops. Abdomen:  Soft,nontender, BS active,nonpalp mass or hsm.   Rectal: Not done Msk:  Symmetrical without gross deformities. . Pulses:  Normal pulses noted. Extremities:  Without clubbing or edema.  Mild palmar erythema Neurologic:  Alert and  oriented x4;  grossly normal neurologically, slightly tremulous Skin:  Intact without significant lesions or rashes.. Psych:  Alert and cooperative. Normal mood and affect.  Intake/Output from previous day: No intake/output data recorded. Intake/Output this shift: No intake/output data recorded.  Lab Results: Recent Labs    06/04/22 1253  WBC 5.9  HGB 13.7  HCT 39.0  PLT 193   BMET Recent Labs    06/04/22 1253  NA 132*  K 3.7  CL 97*  CO2 19*  GLUCOSE 110*  BUN 19  CREATININE 1.11*  CALCIUM 9.3   LFT Recent Labs    06/04/22 1253  PROT 7.6  ALBUMIN 4.5  AST 124*  ALT 94*  ALKPHOS 87  BILITOT 0.6   PT/INR Recent Labs    06/04/22 1253  LABPROT 14.5  INR 1.1   Hepatitis Panel No results for input(s): "HEPBSAG", "HCVAB", "HEPAIGM", "HEPBIGM" in the last 72 hours.   IMPRESSION:  #48 71 year old white female with hematemesis x5 episodes this morning, and  in retrospect noting darker stools over the past 2 to 3 days.  Patient has prior history of diffuse gastritis on EGD 2020 No NSAID use/positive EtOH use  Etiology of hematemesis not clear-rule out possible Mallory-Weiss tear, peptic ulcer disease, diffuse gastropathy,  Patient is currently hemodynamically stable and hemoglobin 13.7 on admission  #2 family history of colon cancer, and personal history of adenomatous colon polyps-up-to-date with colonoscopies last done June 2022  #3 mild transaminitis-probably EtOH related #4 previously documented hepatic steatosis  #6 status post cholecystectomy #7 upper back pain today onset before nausea and vomiting #8 status post several orthopedic procedures including shoulder replacement  #9 history of hypertension  PLAN: Clear liquids today, n.p.o. after midnight IV PPI infusion has been started Serial hemoglobins every 6 hours, transfuse for hemoglobin 8 or less Patient will be scheduled for EGD tomorrow with Dr. Fuller Plan.  Procedure was discussed in detail with the patient including indications risk benefits and she is agreeable to proceed.  GI will follow with you.  (Of note patient's husband/Dr. Pearlie Oyster is actually scheduled for stent removal with Dr. Rush Landmark at 730 tomorrow morning- biliary stent.  He has asked that this procedure be canceled)   Amy Esterwood PA-C 06/04/2022, 3:21 PM   I have taken an interval history, thoroughly reviewed the chart and examined the patient. I agree with the Advanced Practitioner's note, impression and recommendations, and have recorded additional findings, impressions and recommendations below. I performed a substantive portion of this  encounter (>50% time spent), including a complete performance of the medical decision making.  My additional thoughts are as follows:  Acute onset vomiting and hematemesis.  No further bleeding since arrival in ED, initial hemoglobin normal. She had a similar episode  in 2022 when she was taking ibuprofen, but says she no longer uses NSAIDs. She has a typical 2:1 ratio of transaminitis, and I suspect she consumes more alcohol than reported.  However, she has no clinical stigmata of portal hypertension and this seems unlikely to be variceal bleeding.  Probably Mallory-Weiss tear, less likely peptic ulcer.  She is being admitted for overnight observation, serial hemoglobin and hematocrit, IV PPI, n.p.o. except ice chips and EGD with Dr. Fuller Plan tomorrow.  Please call GI service before then if there is evidence of brisk GI bleeding.  Nelida Meuse III Office:[phone]

## 2022-06-04 NOTE — H&P (Addendum)
[facility] History and Physical  DEBBORA ANG ZOX:[id number] DOB: 03/15/1951 DOA: 06/04/2022   PCP: Charlane Ferretti, MD  Specialists: Followed by Dr. Henrene Pastor with gastroenterology.  Chief Complaint: Vomiting of blood  HPI: Brittany Washington is a 71 y.o. female with a past medical history of chronic back pain, essential hypertension, hypothyroidism, osteoporosis, previous history of GI bleed secondary to gastritis from NSAIDs, who was in her usual state of health at about 10:00 this morning when she suddenly experienced nausea followed by back-to-back episodes of vomiting of blood.  She had 5 such episodes.  Total quantity was about a pint according to patient's husband.  She did feel weak and fatigued but denies any lightheadedness or syncopal episodes.  No chest pain.  No abdominal pain per se.  No recent illness or sickness.  She denies using NSAIDs.  Celebrex is noted on her medication list though it is not reconciled yet.  She drinks 2 glasses of wine on a daily basis.  No further recurrence of her bleeding since this morning.  In the emergency department she was noted to be hemodynamically stable.  Hemoglobin is 13.7.  Platelet counts normal.  PT/INR is normal.  She will be hospitalized for further management.  Home Medications: This list is not reconciled yet Prior to Admission medications   Medication Sig Start Date End Date Taking? Authorizing Provider  celecoxib (CELEBREX) 200 MG capsule Take 200 mg by mouth 2 (two) times daily. 07/17/21   [provider]  cetirizine (ZYRTEC) 10 MG tablet Take 10 mg by mouth at bedtime.     [provider]  Cholecalciferol (VITAMIN D3) 400 units CAPS Take 400 Units by mouth daily.    [provider]  clonazePAM (KLONOPIN) 1 MG tablet Take 1 tablet (1 mg total) by mouth at bedtime. Take 1-2 tablets by mouth daily at bedtime as needed for sleep. 12/20/15   Nahser, Wonda Cheng, MD  denosumab (PROLIA) 60 MG/ML SOSY injection  Inject 60 mg into the skin every 6 (six) months.    [provider]  diphenhydrAMINE (BENADRYL) 25 MG tablet Take 25 mg by mouth at bedtime as needed for allergies.    [provider]  gabapentin (NEURONTIN) 100 MG capsule Take 100 mg by mouth daily. 07/07/21   [provider]  hydrochlorothiazide (MICROZIDE) 12.5 MG capsule TAKE 1 CAPSULE BY MOUTH DAILY. 09/21/15   Darlin Coco, MD  levothyroxine (SYNTHROID, LEVOTHROID) 112 MCG tablet TAKE 1 TABLET BY MOUTH ONCE DAILY 10/19/14   Lendon Colonel, NP  methocarbamol (ROBAXIN) 500 MG tablet Take 500 mg by mouth at bedtime as needed for muscle spasms.    [provider]  metoprolol tartrate (LOPRESSOR) 25 MG tablet Take 12.5 mg by mouth 2 (two) times daily.    [provider]  ondansetron (ZOFRAN-ODT) 4 MG disintegrating tablet Take 4 mg by mouth every 6 (six) hours as needed. 03/30/21   [provider]  pantoprazole (PROTONIX) 40 MG tablet Take 1 tablet (40 mg total) by mouth daily at 6 (six) AM. 03/11/19   Black, Lezlie Octave, NP  Turmeric 500 MG CAPS Take by mouth.    [provider]  valsartan (DIOVAN) 160 MG tablet Take 160 mg by mouth daily. 09/26/18   [provider]    Allergies:  Allergies  Allergen Reactions   Sulfa Antibiotics Diarrhea and Other (See Comments)    Body aches    Fosamax [Alendronate] Other (See Comments)    Heartburn, myalgias  Past Medical History: Past Medical History:  Diagnosis Date   Benign hypertensive heart disease without heart failure    GERD (gastroesophageal reflux disease)    Heart murmur, systolic    MECHNICAL   Hypertension    mild, lost weight, no meds now   Hypothyroidism    Osteoporosis    Primary osteoarthritis of right hip 06/18/2018    Past Surgical History:  Procedure Laterality Date   APPENDECTOMY     BACK SURGERY     BIOPSY  03/10/2019   Procedure: BIOPSY;  Surgeon: Milus Banister, MD;  Location: [facility] ENDOSCOPY;   Service: Endoscopy;;   COLONOSCOPY     ESOPHAGOGASTRODUODENOSCOPY N/A 03/10/2019   Procedure: ESOPHAGOGASTRODUODENOSCOPY (EGD);  Surgeon: Milus Banister, MD;  Location: [facility] ENDOSCOPY;  Service: Endoscopy;  Laterality: N/A;   FRACTURE SURGERY     HAMMER TOE SURGERY Left 07/14/2013   Procedure: HAMMER TOE CORRECTION LEFT SECOND TOE, [place]UST[place]/AKIN LEFT;  Surgeon: [name], DPM;  Location: [place];  Service: Podiatry;  Laterality: Left;   JOINT REPLACEMENT     LAPAROSCOPIC APPENDECTOMY N/A 12/29/2016   Procedure: APPENDECTOMY LAPAROSCOPIC;  Surgeon: Excell Seltzer, MD;  Location: [facility] ORS;  Service: General;  Laterality: N/A;   [place] DISKECTOMY W/ DISSECTION, LEFT L4 -- L5  02-23-2004   METATARSAL OSTEOTOMY Left 04/02/2015   Procedure: LEFT FOOT METATARSAL OSTEOTOMY;  Surgeon: [name], MD;  Location: [place];  Service: Orthopedics;  Laterality: Left;   METATARSAL OSTEOTOMY Left 08/18/2016   Procedure: METATARSAL OSTEOTOMY SECOND TOE;  Surgeon: [name], MD;  Location: [place];  Service: Orthopedics;  Laterality: Left;   ORIF LEFT DISTAL RADIAL FX W/ BONE GRAFT  01-14-2009   POSTERIOR LUMBAR FUSION  05-16-2005   L4 -- L5   RE-DO LUMBAR DISKECTOMY , L4  - L5  05-03-2004   REVERSE SHOULDER ARTHROPLASTY Right 05/23/2021   Procedure: REVERSE SHOULDER ARTHROPLASTY;  Surgeon: Hiram Gash, MD;  Location: [place];  Service: Orthopedics;  Laterality: Right;   TOTAL HIP ARTHROPLASTY Right 06/18/2018   TOTAL HIP ARTHROPLASTY Right 06/18/2018   Procedure: RIGHT TOTAL HIP ARTHROPLASTY ANTERIOR APPROACH;  Surgeon: [name], MD;  Location: [place];  Service: Orthopedics;  Laterality: Right;   TRIGGER FINGER RELEASE  01/09/2012   Procedure: RELEASE TRIGGER FINGER/A-1 PULLEY;  Surgeon: [name], MD;  Location: [place];  Service: Orthopedics;  Laterality:  Left;   VAGINAL HYSTERECTOMY  1993   W/ BILATERAL SALPINGOOPHORECTOMY   WRIST FRACTURE SURGERY Left     Social History: Lives with her husband.  Denies smoking.  Drinks 2 glasses of wine on a daily basis.  Denies any beer intake or liquor use.  Usually independent with daily activities.   Family History:  Family History  Problem Relation Age of Onset   Heart attack Father    Colon cancer Father    Heart attack Brother    Ovarian cancer Mother    Rectal cancer Neg Hx    Stomach cancer Neg Hx    Breast cancer Neg Hx      Review of Systems - History obtained from the patient General ROS: negative Psychological ROS: negative Ophthalmic ROS: negative ENT ROS: negative Allergy and Immunology ROS: negative Hematological and Lymphatic ROS: negative Endocrine ROS: negative Respiratory ROS: no cough, shortness of breath, or wheezing Cardiovascular ROS: no chest pain or dyspnea on exertion Gastrointestinal ROS: As  in HPI Genito-Urinary ROS: no dysuria, trouble voiding, or hematuria Musculoskeletal ROS: Chronic back pain Neurological ROS: no TIA or stroke symptoms Dermatological ROS: negative  Physical Examination  Vitals:   06/04/22 1157 06/04/22 1330 06/04/22 1403  BP: 129/87  (!) 156/85  Pulse: 80 76 87  Resp: 16  18  Temp: 98 F (36.7 C)  97.7 F (36.5 C)  TempSrc: Oral  Oral  SpO2: 99% 98% 97%    BP (!) 156/85 (BP Location: Right Arm)   Pulse 87   Temp 97.7 F (36.5 C) (Oral)   Resp 18   SpO2 97%   General appearance: alert, cooperative, appears stated age, and no distress Head: Normocephalic, without obvious abnormality, atraumatic Eyes: conjunctivae/corneas clear. PERRL, EOM's intact.  Throat: lips, mucosa, and tongue normal; teeth and gums normal Neck: no adenopathy, no carotid bruit, no JVD, supple, symmetrical, trachea midline, and thyroid not enlarged, symmetric, no tenderness/mass/nodules Resp: clear to auscultation bilaterally Cardio: regular rate and  rhythm, S1, S2 normal, no murmur, click, rub or gallop GI: soft, mildly tender in the epigastric area without any rebound rigidity or guarding.; bowel sounds normal; no masses,  no organomegaly Extremities: extremities normal, atraumatic, no cyanosis or edema Pulses: 2+ and symmetric Skin: Skin color, texture, turgor normal. No rashes or lesions Lymph nodes: Cervical, supraclavicular, and axillary nodes normal. Neurologic: Alert and oriented x3.  Cranial nerves II to XII intact.  Motor strength equal bilateral upper and lower extremities.   Labs on Admission: I have personally reviewed following labs and imaging studies  CBC: Recent Labs  Lab 06/04/22 1253  WBC 5.9  NEUTROABS 4.0  HGB 13.7  HCT 39.0  MCV 103.7*  PLT 540   Basic Metabolic Panel: Recent Labs  Lab 06/04/22 1253  NA 132*  K 3.7  CL 97*  CO2 19*  GLUCOSE 110*  BUN 19  CREATININE 1.11*  CALCIUM 9.3   GFR: CrCl cannot be calculated (Unknown ideal weight.). Liver Function Tests: Recent Labs  Lab 06/04/22 1253  AST 124*  ALT 94*  ALKPHOS 87  BILITOT 0.6  PROT 7.6  ALBUMIN 4.5    Coagulation Profile: Recent Labs  Lab 06/04/22 1253  INR 1.1     Radiological Exams on Admission: No results found.    Problem List  Principal Problem:   Hematemesis Active Problems:   Abnormal LFTs (liver function tests)   Primary hypertension   Assessment: This is a 71 year old Caucasian female with past medical history as stated earlier who comes in after she had a few episodes of hematemesis this morning.  Presentation could be due to gastritis, ulcer disease, alcohol use could be contributing.  Patient with previous history of upper GI bleed secondary to gastritis thought to be due to NSAID use.  This was in 2020.  She is up-to-date on her colonoscopy.  Hemoglobin surprisingly is noted to be normal.  She could be hemoconcentrated.  Plan:  #1. Hematemesis: She will be hospitalized.  Made NPO.  GI has been  consulted by EDP.  She will be placed on PPI infusion.  She does drink 2 glasses of wine on a daily basis.  Her LFTs are noted to be abnormal although this is chronic for her.  Previous endoscopy did not show any portal hypertension or evidence for varices.  Hold off on octreotide for now.  PT/INR is normal.  She is not on antiplatelets or anticoagulants.  Celebrex is mentioned on her medication list but this may not be a  current list.  Recheck her hemoglobin.  #2.  Hyponatremia: Could be due to hypovolemia.  Will gently hydrate and recheck tomorrow.  Sodium level was noted to be low last year as well.  Could be related to her hepatic steatosis.  #3.  Abnormal LFTs with elevated transaminases: Seen previously as well.  Hepatitis panel was unremarkable in 2020.  Ultrasound of the hepatobiliary system in 2020 showed hepatic steatosis.  She is followed by gastroenterology.  This can be addressed in the outpatient setting.  #4.  Essential hypertension: Hold her home medications for now.  Monitor blood pressures closely.  Looks like she is on valsartan, HCTZ and metoprolol.  #5.  Hypothyroidism: Should be able to resume her levothyroxine once medication list is reconciled.  #6 Chronic back pain: She has had multiple back surgeries.  Cannot give her NSAIDs.  Would hold off on giving her Tylenol for now.  Low-dose morphine as needed.  #7. Alcohol Use: Patient mentioned drinking 2 glasses of wine per night. When asked if she consumes more than that at times she denied. Patient is noted to be slightly tremulous. Will place her on CIWA for now. Monitor closely. Thiamine, folate, multivitamins.   DVT Prophylaxis: SCDs Code Status: Full code Family Communication: Discussed with the patient and her husband Disposition: Hopefully return home in improved Consults called: [facility] gastroenterology Admission Status: Status is: Observation The patient remains OBS appropriate and will d/c before 2  midnights.    Severity of Illness: The appropriate patient status for this patient is OBSERVATION. Observation status is judged to be reasonable and necessary in order to provide the required intensity of service to ensure the patient's safety. The patient's presenting symptoms, physical exam findings, and initial radiographic and laboratory data in the context of their medical condition is felt to place them at decreased risk for further clinical deterioration. Furthermore, it is anticipated that the patient will be medically stable for discharge from the hospital within 2 midnights of admission.    Further management decisions will depend on results of further testing and patient's response to treatment.   Brittany Washington [employer]  [facility] operational officer on [email or web address]  06/04/2022, 3:03 PM

## 2022-06-05 ENCOUNTER — Observation Stay (HOSPITAL_COMMUNITY): Payer: Medicare Other | Admitting: Anesthesiology

## 2022-06-05 ENCOUNTER — Other Ambulatory Visit: Payer: Self-pay

## 2022-06-05 ENCOUNTER — Encounter (HOSPITAL_COMMUNITY): Payer: Self-pay | Admitting: Internal Medicine

## 2022-06-05 ENCOUNTER — Encounter (HOSPITAL_COMMUNITY)
Admission: EM | Disposition: A | Discharge status: Home / Self Care | Payer: Self-pay | Source: Home / Self Care | Attending: Internal Medicine

## 2022-06-05 DIAGNOSIS — G8929 Other chronic pain: Secondary | ICD-10-CM | POA: Diagnosis present

## 2022-06-05 DIAGNOSIS — D7589 Other specified diseases of blood and blood-forming organs: Secondary | ICD-10-CM | POA: Diagnosis present

## 2022-06-05 DIAGNOSIS — Z90710 Acquired absence of both cervix and uterus: Secondary | ICD-10-CM | POA: Diagnosis not present

## 2022-06-05 DIAGNOSIS — R531 Weakness: Secondary | ICD-10-CM | POA: Diagnosis not present

## 2022-06-05 DIAGNOSIS — K2931 Chronic superficial gastritis with bleeding: Secondary | ICD-10-CM | POA: Diagnosis not present

## 2022-06-05 DIAGNOSIS — D62 Acute posthemorrhagic anemia: Secondary | ICD-10-CM | POA: Diagnosis present

## 2022-06-05 DIAGNOSIS — K760 Fatty (change of) liver, not elsewhere classified: Secondary | ICD-10-CM | POA: Diagnosis present

## 2022-06-05 DIAGNOSIS — E039 Hypothyroidism, unspecified: Secondary | ICD-10-CM | POA: Diagnosis not present

## 2022-06-05 DIAGNOSIS — I119 Hypertensive heart disease without heart failure: Secondary | ICD-10-CM | POA: Diagnosis present

## 2022-06-05 DIAGNOSIS — D519 Vitamin B12 deficiency anemia, unspecified: Secondary | ICD-10-CM | POA: Diagnosis present

## 2022-06-05 DIAGNOSIS — Z79899 Other long term (current) drug therapy: Secondary | ICD-10-CM | POA: Diagnosis not present

## 2022-06-05 DIAGNOSIS — Z8249 Family history of ischemic heart disease and other diseases of the circulatory system: Secondary | ICD-10-CM | POA: Diagnosis not present

## 2022-06-05 DIAGNOSIS — K219 Gastro-esophageal reflux disease without esophagitis: Secondary | ICD-10-CM | POA: Diagnosis present

## 2022-06-05 DIAGNOSIS — R569 Unspecified convulsions: Secondary | ICD-10-CM | POA: Diagnosis not present

## 2022-06-05 DIAGNOSIS — F1010 Alcohol abuse, uncomplicated: Secondary | ICD-10-CM | POA: Diagnosis not present

## 2022-06-05 DIAGNOSIS — Z96611 Presence of right artificial shoulder joint: Secondary | ICD-10-CM | POA: Diagnosis present

## 2022-06-05 DIAGNOSIS — E890 Postprocedural hypothyroidism: Secondary | ICD-10-CM | POA: Diagnosis not present

## 2022-06-05 DIAGNOSIS — I10 Essential (primary) hypertension: Secondary | ICD-10-CM

## 2022-06-05 DIAGNOSIS — G4489 Other headache syndrome: Secondary | ICD-10-CM | POA: Diagnosis not present

## 2022-06-05 DIAGNOSIS — R7989 Other specified abnormal findings of blood chemistry: Secondary | ICD-10-CM | POA: Diagnosis not present

## 2022-06-05 DIAGNOSIS — R4781 Slurred speech: Secondary | ICD-10-CM | POA: Diagnosis not present

## 2022-06-05 DIAGNOSIS — Z9049 Acquired absence of other specified parts of digestive tract: Secondary | ICD-10-CM | POA: Diagnosis not present

## 2022-06-05 DIAGNOSIS — M810 Age-related osteoporosis without current pathological fracture: Secondary | ICD-10-CM | POA: Diagnosis present

## 2022-06-05 DIAGNOSIS — D638 Anemia in other chronic diseases classified elsewhere: Secondary | ICD-10-CM | POA: Diagnosis not present

## 2022-06-05 DIAGNOSIS — R519 Headache, unspecified: Secondary | ICD-10-CM | POA: Diagnosis not present

## 2022-06-05 DIAGNOSIS — Z96641 Presence of right artificial hip joint: Secondary | ICD-10-CM | POA: Diagnosis present

## 2022-06-05 DIAGNOSIS — R000 Tachycardia, unspecified: Secondary | ICD-10-CM | POA: Diagnosis not present

## 2022-06-05 DIAGNOSIS — Z882 Allergy status to sulfonamides status: Secondary | ICD-10-CM | POA: Diagnosis not present

## 2022-06-05 DIAGNOSIS — J80 Acute respiratory distress syndrome: Secondary | ICD-10-CM | POA: Diagnosis not present

## 2022-06-05 DIAGNOSIS — R4701 Aphasia: Secondary | ICD-10-CM | POA: Diagnosis not present

## 2022-06-05 DIAGNOSIS — Z79890 Hormone replacement therapy (postmenopausal): Secondary | ICD-10-CM | POA: Diagnosis not present

## 2022-06-05 DIAGNOSIS — K2971 Gastritis, unspecified, with bleeding: Principal | ICD-10-CM

## 2022-06-05 DIAGNOSIS — K2970 Gastritis, unspecified, without bleeding: Secondary | ICD-10-CM

## 2022-06-05 DIAGNOSIS — E876 Hypokalemia: Secondary | ICD-10-CM | POA: Diagnosis present

## 2022-06-05 DIAGNOSIS — K920 Hematemesis: Secondary | ICD-10-CM | POA: Diagnosis not present

## 2022-06-05 DIAGNOSIS — Z8041 Family history of malignant neoplasm of ovary: Secondary | ICD-10-CM | POA: Diagnosis not present

## 2022-06-05 DIAGNOSIS — E871 Hypo-osmolality and hyponatremia: Secondary | ICD-10-CM | POA: Diagnosis present

## 2022-06-05 DIAGNOSIS — Z800 Family history of malignant neoplasm of digestive organs: Secondary | ICD-10-CM | POA: Diagnosis not present

## 2022-06-05 DIAGNOSIS — R479 Unspecified speech disturbances: Secondary | ICD-10-CM | POA: Diagnosis not present

## 2022-06-05 DIAGNOSIS — R58 Hemorrhage, not elsewhere classified: Secondary | ICD-10-CM | POA: Diagnosis not present

## 2022-06-05 DIAGNOSIS — R410 Disorientation, unspecified: Secondary | ICD-10-CM | POA: Diagnosis not present

## 2022-06-05 HISTORY — PX: HOT HEMOSTASIS: SHX5433

## 2022-06-05 HISTORY — PX: ESOPHAGOGASTRODUODENOSCOPY (EGD) WITH PROPOFOL: SHX5813

## 2022-06-05 HISTORY — PX: BIOPSY: SHX5522

## 2022-06-05 LAB — RETICULOCYTES
Immature Retic Fract: 12 % (ref 2.3–15.9)
RBC.: 3.18 MIL/uL — ABNORMAL LOW (ref 3.87–5.11)
Retic Count, Absolute: 82 10*3/uL (ref 19.0–186.0)
Retic Ct Pct: 2.6 % (ref 0.4–3.1)

## 2022-06-05 LAB — COMPREHENSIVE METABOLIC PANEL
ALT: 62 U/L — ABNORMAL HIGH (ref 0–44)
AST: 70 U/L — ABNORMAL HIGH (ref 15–41)
Albumin: 3.6 g/dL (ref 3.5–5.0)
Alkaline Phosphatase: 69 U/L (ref 38–126)
Anion gap: 9 (ref 5–15)
BUN: 11 mg/dL (ref 8–23)
CO2: 21 mmol/L — ABNORMAL LOW (ref 22–32)
Calcium: 8.1 mg/dL — ABNORMAL LOW (ref 8.9–10.3)
Chloride: 106 mmol/L (ref 98–111)
Creatinine, Ser: 0.98 mg/dL (ref 0.44–1.00)
GFR, Estimated: >60 mL/min (ref >60)
Glucose, Bld: 99 mg/dL (ref 70–99)
Potassium: 3.2 mmol/L — ABNORMAL LOW (ref 3.5–5.1)
Sodium: 136 mmol/L (ref 135–145)
Total Bilirubin: 1.1 mg/dL (ref 0.3–1.2)
Total Protein: 6 g/dL — ABNORMAL LOW (ref 6.5–8.1)

## 2022-06-05 LAB — TSH: TSH: 11.839 u[IU]/mL — ABNORMAL HIGH (ref 0.350–4.500)

## 2022-06-05 LAB — CBC
HCT: 34.9 % — ABNORMAL LOW (ref 36.0–46.0)
HCT: 34.6 % — ABNORMAL LOW (ref 36.0–46.0)
Hemoglobin: 11.7 g/dL — ABNORMAL LOW (ref 12.0–15.0)
Hemoglobin: 11.3 g/dL — ABNORMAL LOW (ref 12.0–15.0)
MCH: 36.2 pg — ABNORMAL HIGH (ref 26.0–34.0)
MCH: 36 pg — ABNORMAL HIGH (ref 26.0–34.0)
MCHC: 33.5 g/dL (ref 30.0–36.0)
MCHC: 32.7 g/dL (ref 30.0–36.0)
MCV: 108 fL — ABNORMAL HIGH (ref 80.0–100.0)
MCV: 110.2 fL — ABNORMAL HIGH (ref 80.0–100.0)
Platelets: 133 10*3/uL — ABNORMAL LOW (ref 150–400)
Platelets: 131 10*3/uL — ABNORMAL LOW (ref 150–400)
RBC: 3.23 MIL/uL — ABNORMAL LOW (ref 3.87–5.11)
RBC: 3.14 MIL/uL — ABNORMAL LOW (ref 3.87–5.11)
RDW: 12.4 % (ref 11.5–15.5)
RDW: 13 % (ref 11.5–15.5)
WBC: 4.5 10*3/uL (ref 4.0–10.5)
WBC: 4.3 10*3/uL (ref 4.0–10.5)
nRBC: 0 % (ref 0.0–0.2)
nRBC: 0 % (ref 0.0–0.2)

## 2022-06-05 LAB — IRON AND TIBC
Iron: 79 ug/dL (ref 28–170)
Saturation Ratios: 26 % (ref 10.4–31.8)
TIBC: 306 ug/dL (ref 250–450)
UIBC: 227 ug/dL

## 2022-06-05 LAB — VITAMIN B12: Vitamin B-12: 220 pg/mL (ref 180–914)

## 2022-06-05 LAB — HIV ANTIBODY (ROUTINE TESTING W REFLEX): HIV Screen 4th Generation wRfx: NONREACTIVE

## 2022-06-05 LAB — MAGNESIUM: Magnesium: 1.7 mg/dL (ref 1.7–2.4)

## 2022-06-05 LAB — FERRITIN: Ferritin: 519 ng/mL — ABNORMAL HIGH (ref 11–307)

## 2022-06-05 LAB — FOLATE: Folate: 11 ng/mL (ref >5.9)

## 2022-06-05 SURGERY — ESOPHAGOGASTRODUODENOSCOPY (EGD) WITH PROPOFOL
Anesthesia: Monitor Anesthesia Care

## 2022-06-05 MED ORDER — PROPOFOL 10 MG/ML IV BOLUS
INTRAVENOUS | Status: DC | PRN
  Administered 2022-06-05: 30 mg via INTRAVENOUS
  Administered 2022-06-05: 20 mg via INTRAVENOUS
  Administered 2022-06-05: 30 mg via INTRAVENOUS
  Administered 2022-06-05: 50 mg via INTRAVENOUS
  Administered 2022-06-05 (×2): 30 mg via INTRAVENOUS
  Administered 2022-06-05: 50 mg via INTRAVENOUS

## 2022-06-05 MED ORDER — LIDOCAINE 2% (20 MG/ML) 5 ML SYRINGE
INTRAMUSCULAR | Status: DC | PRN
  Administered 2022-06-05: 80 mg via INTRAVENOUS

## 2022-06-05 MED ORDER — POTASSIUM CHLORIDE 10 MEQ/100ML IV SOLN
10.0000 meq | INTRAVENOUS | Status: AC
  Administered 2022-06-05 (×4): 10 meq via INTRAVENOUS
  Filled 2022-06-05 (×4): qty 100

## 2022-06-05 MED ORDER — LACTATED RINGERS IV SOLN: INTRAVENOUS | Status: DC | PRN

## 2022-06-05 MED ORDER — MAGNESIUM SULFATE 2 GM/50ML IV SOLN
2.0000 g | Freq: Once | INTRAVENOUS | Status: AC
  Administered 2022-06-05: 2 g via INTRAVENOUS
  Filled 2022-06-05: qty 50

## 2022-06-05 MED ORDER — MELATONIN 3 MG PO TABS
3.0000 mg | ORAL_TABLET | Freq: Once | ORAL | Status: AC
  Administered 2022-06-06: 3 mg via ORAL
  Filled 2022-06-05: qty 1

## 2022-06-05 MED ORDER — MELATONIN 3 MG PO TABS
3.0000 mg | ORAL_TABLET | Freq: Once | ORAL | Status: AC
  Administered 2022-06-05: 3 mg via ORAL
  Filled 2022-06-05: qty 1

## 2022-06-05 MED ORDER — PROPOFOL 10 MG/ML IV BOLUS
INTRAVENOUS | Status: AC
  Filled 2022-06-05: qty 20

## 2022-06-05 MED ORDER — PANTOPRAZOLE SODIUM 40 MG PO TBEC
40.0000 mg | DELAYED_RELEASE_TABLET | Freq: Two times a day (BID) | ORAL | Status: DC
  Administered 2022-06-05 – 2022-06-06 (×2): 40 mg via ORAL
  Filled 2022-06-05 (×2): qty 1

## 2022-06-05 SURGICAL SUPPLY — 15 items

## 2022-06-05 NOTE — Transfer of Care (Signed)
Immediate Anesthesia Transfer of Care Note  Patient: Brittany Washington  Procedure(s) Performed: ESOPHAGOGASTRODUODENOSCOPY (EGD) WITH PROPOFOL BIOPSY HOT HEMOSTASIS (ARGON PLASMA COAGULATION/BICAP)  Patient Location: PACU  Anesthesia Type:MAC  Level of Consciousness: awake, alert  and oriented  Airway & Oxygen Therapy: Patient Spontanous Breathing and Patient connected to face mask oxygen  Post-op Assessment: Report given to RN and Post -op Vital signs reviewed and stable  Post vital signs: Reviewed and stable  Last Vitals:  Vitals Value Taken Time  BP 107/54 06/05/22 1525  Temp    Pulse 94 06/05/22 1526  Resp 15 06/05/22 1526  SpO2 97 % 06/05/22 1526  Vitals shown include unvalidated device data.  Last Pain:  Vitals:   06/05/22 1410  TempSrc: Oral  PainSc: 0-No pain      Patients Stated Pain Goal: 0 (81/10/31 5945)  Complications: No notable events documented.

## 2022-06-05 NOTE — Op Note (Addendum)
[facility] Patient Name: Brittany Washington Procedure Date: 06/05/2022 MRN: [id number] Attending MD: Ladene Artist , MD Date of Birth: [birth year] CSN: [id number] Age: 71 Admit Type: Inpatient Procedure:                Upper GI endoscopy Indications:              Hematemesis Providers:                Pricilla Riffle. Fuller Plan, MD, Carlyn Reichert, RN, Cletis Athens, Technician, [facility], CRNA Referring MD:             [facility] Medicines:                Monitored Anesthesia Care Complications:            No immediate complications. Estimated Blood Loss:     Estimated blood loss was minimal. Procedure:                Pre-Anesthesia Assessment:                           - Prior to the procedure, a History and Physical                            was performed, and patient medications and                            allergies were reviewed. The patient's tolerance of                            previous anesthesia was also reviewed. The risks                            and benefits of the procedure and the sedation                            options and risks were discussed with the patient.                            All questions were answered, and informed consent                            was obtained. Prior Anticoagulants: The patient has                            taken no previous anticoagulant or antiplatelet                            agents. ASA Grade Assessment: II - A patient with                            mild systemic disease. After reviewing the risks  and benefits, the patient was deemed in                            satisfactory condition to undergo the procedure.                           After obtaining informed consent, the endoscope was                            passed under direct vision. Throughout the                            procedure, the patient's blood pressure, pulse, and                             oxygen saturations were monitored continuously. The                            [id number]-H190 ([id number]) Olympus endoscope was introduced                            through the mouth, and advanced to the second part                            of duodenum. The upper GI endoscopy was                            accomplished without difficulty. The patient                            tolerated the procedure well. Scope In: Scope Out: Findings:      The examined esophagus was normal.      Patchy moderate inflammation with hemorrhage characterized by congestion       (edema) and friability was found on the anterior wall of the gastric       body. Biopsies were taken with a cold forceps for histology. One focal       area with minimal oozing. Coagulation for hemostasis using argon plasma       was successful.      The exam of the stomach was otherwise normal.      The duodenal bulb and second portion of the duodenum were normal. Impression:               - Normal esophagus.                           - Gastritis with minimal oozing. Biopsied. Treated                            with argon plasma coagulation (APC).                           - Normal duodenal bulb and second portion of the  duodenum. Moderate Sedation:      Not Applicable - Patient had care per Anesthesia. Recommendation:           - Return patient to hospital ward for ongoing care.                           - Full liquid diet today and advance tomorrow as                            tolerated.                           - Continue present medications.                           - Await pathology results.                           - No aspirin, ibuprofen, naproxen, or other                            non-steroidal anti-inflammatory drugs.                           - Reduce or discontinue alcohol use.                           - Outpatient GI follow up with Dr. Scarlette Shorts in 2                            months.                            - If stable overnight anticipate discharge tomorrow. Procedure Code(s):        --- Professional ---                           26712, 25, Esophagogastroduodenoscopy, flexible,                            transoral; with control of bleeding, any method                           43239, Esophagogastroduodenoscopy, flexible,                            transoral; with biopsy, single or multiple Diagnosis Code(s):        --- Professional ---                           K29.71, Gastritis, unspecified, with bleeding                           K92.0, Hematemesis CPT copyright 2019 American Medical Association. All rights reserved. The codes documented in this report are preliminary and upon coder review may  be revised to meet current compliance requirements. Ladene Artist, MD 06/05/2022 3:27:11 PM This  report has been signed electronically. Number of Addenda: 0

## 2022-06-05 NOTE — Progress Notes (Addendum)
[facility] PROGRESS NOTE   Brittany Washington IRW:[id number] DOB: July 25, 1951 DOA: 06/04/2022  PCP: Charlane Ferretti, MD  Brief History/Interval Summary:  71 y.o. female with a past medical history of chronic back pain, essential hypertension, hypothyroidism, osteoporosis, previous history of GI bleed secondary to gastritis from NSAIDs, who was in her usual state of health at about 10:00 on the morning of admission when she suddenly experienced nausea followed by back-to-back episodes of hematemesis.  She had 5 such episodes.  She presented to the hospital.  She was hospitalized for further management.  GI was consulted.    Consultants: [facility] gastroenterology  Procedures: EGD is planned for today    Subjective/Interval History: Patient feels well this morning.  Denies any abdominal pain.  No further episodes of nausea or vomiting.  Denies any tremors this morning.     Assessment/Plan:  Hematemesis Differential diagnosis is broad.  Gastroenterology has been consulted.  Plan is for upper endoscopy today. Patient is not on any anticoagulants.  Celebrex was mentioned on her home medication list. She also consumes 2 glasses of wine daily. Previous endoscopy in 2020 did not reveal any esophageal varices.  No evidence for portal hypertension.   Some drop in hemoglobin is noted.  We will recheck later today.   Continue PPI infusion for now.  Acute blood loss anemia/ Macrocytosis Drop in hemoglobin is noted.  Recheck later today.  Hopefully we can avoid blood transfusions.  We will check anemia panel.  Check TSH.  Hyponatremia Improved with IV fluids.  Hypokalemia Will be repleted.  Magnesium is 1.7.  This will also be repleted.  Essential hypertension Antihypertensives on hold.  She does take valsartan, HCTZ and metoprolol prior to admission.  Blood pressure is reasonably well controlled.  Abnormal LFTs Likely due to combination of hepatic steatosis as well as alcohol  use. Seen previously as well.  Hepatitis panel was unremarkable in 2020.  Ultrasound of the hepatobiliary system in 2020 showed hepatic steatosis.  She is followed by gastroenterology.  This can be addressed in the outpatient setting. LFTs are better today compared to yesterday.  Alcohol abuse Patient mentioned drinking 2 glasses of wine per night. When asked if she consumes more than that at times she denied.  Patient was noted to be tremulous yesterday.  She was placed on CIWA protocol.  Thiamine folic acid and multivitamins. Stable this morning.  No clear evidence for alcohol withdrawal at this time.  Hypothyroidism Resume her home medications once she is able to take orally.  Chronic back pain She has had multiple back surgeries.  No neurological deficits noted.  DVT Prophylaxis: SCDs Code Status: Full code Family Communication: Discussed with the patient Disposition Plan: Hopefully return home when improved  Status is: Observation The patient may require care spanning > 2 midnights and should be moved to inpatient because: GI bleed      Medications: Scheduled:  folic acid  1 mg Oral Daily   multivitamin with minerals  1 tablet Oral Daily   thiamine  100 mg Oral Daily   Or   thiamine  100 mg Intravenous Daily   Continuous:  sodium chloride 75 mL/hr at 06/05/22 0400   magnesium sulfate bolus IVPB     pantoprazole 8 mg/hr (06/05/22 0400)   potassium chloride 10 mEq (06/05/22 1014)   [id number], LORazepam **OR** LORazepam, morphine injection, ondansetron  Antibiotics: Anti-infectives (From admission, onward)    None       Objective:  Vital Signs  Vitals:   06/05/22 0328 06/05/22 0746 06/05/22 0800 06/05/22 0816  BP: (!) 148/83  139/82   Pulse: 77  70   Resp: '20 19 20 17  '$ Temp: 98.1 F (36.7 C)  98.4 F (36.9 C)   TempSrc: Oral  Oral   SpO2: 98%  99%   Height:        Intake/Output Summary (Last 24 hours) at 06/05/2022 1115 Last data filed at  06/05/2022 0400 Gross per 24 hour  Intake 1023 ml  Output --  Net 1023 ml   There were no vitals filed for this visit.  General appearance: Awake alert.  In no distress Resp: Clear to auscultation bilaterally.  Normal effort Cardio: S1-S2 is normal regular.  No S3-S4.  No rubs murmurs or bruit GI: Abdomen is soft.  Nontender nondistended.  Bowel sounds are present normal.  No masses organomegaly Extremities: No edema.  Full range of motion of lower extremities. Neurologic: Alert and oriented x3.  No focal neurological deficits.    Lab Results:  Data Reviewed: I have personally reviewed following labs and reports of the imaging studies  CBC: Recent Labs  Lab 06/04/22 1253 06/05/22 0402  WBC 5.9 4.5  NEUTROABS 4.0  --   HGB 13.7 11.7*  HCT 39.0 34.9*  MCV 103.7* 108.0*  PLT 193 133*    Basic Metabolic Panel: Recent Labs  Lab 06/04/22 1253 06/05/22 0402 06/05/22 0654  NA 132* 136  --   K 3.7 3.2*  --   CL 97* 106  --   CO2 19* 21*  --   GLUCOSE 110* 99  --   BUN 19 11  --   CREATININE 1.11* 0.98  --   CALCIUM 9.3 8.1*  --   MG  --   --  1.7    GFR: CrCl cannot be calculated (Unknown ideal weight.).  Liver Function Tests: Recent Labs  Lab 06/04/22 1253 06/05/22 0402  AST 124* 70*  ALT 94* 62*  ALKPHOS 87 69  BILITOT 0.6 1.1  PROT 7.6 6.0*  ALBUMIN 4.5 3.6     Coagulation Profile: Recent Labs  Lab 06/04/22 1253  INR 1.1     Radiology Studies: No results found.     LOS: 0 days   Rachard Isidro [employer] on [email or web address]  06/05/2022, 11:15 AM

## 2022-06-05 NOTE — Anesthesia Preprocedure Evaluation (Signed)
Anesthesia Evaluation  Patient identified by MRN, date of birth, ID band Patient awake    Reviewed: Allergy & Precautions, NPO status , Patient's Chart, lab work & pertinent test results  Airway Mallampati: II  TM Distance: >3 FB Neck ROM: Full    Dental   Pulmonary neg pulmonary ROS,    breath sounds clear to auscultation       Cardiovascular hypertension, Pt. on medications  Rhythm:Regular Rate:Normal     Neuro/Psych negative neurological ROS     GI/Hepatic Neg liver ROS, GERD  ,  Endo/Other  Hypothyroidism   Renal/GU Renal disease     Musculoskeletal  (+) Arthritis ,   Abdominal   Peds  Hematology  (+) Blood dyscrasia, anemia ,   Anesthesia Other Findings   Reproductive/Obstetrics                             Anesthesia Physical Anesthesia Plan  ASA: 3  Anesthesia Plan: MAC   Post-op Pain Management: Minimal or no pain anticipated   Induction:   PONV Risk Score and Plan: 2 and Ondansetron  Airway Management Planned: Natural Airway and Nasal Cannula  Additional Equipment:   Intra-op Plan:   Post-operative Plan:   Informed Consent: I have reviewed the patients History and Physical, chart, labs and discussed the procedure including the risks, benefits and alternatives for the proposed anesthesia with the patient or authorized representative who has indicated his/her understanding and acceptance.       Plan Discussed with:   Anesthesia Plan Comments:         Anesthesia Quick Evaluation

## 2022-06-05 NOTE — Interval H&P Note (Signed)
History and Physical Interval Note:  06/05/2022 2:47 PM  Brittany Washington  has presented today for surgery, with the diagnosis of hematemesis.  The various methods of treatment have been discussed with the patient and family. After consideration of risks, benefits and other options for treatment, the patient has consented to  Procedure(s): ESOPHAGOGASTRODUODENOSCOPY (EGD) WITH PROPOFOL (N/A) as a surgical intervention.  The patient's history has been reviewed, patient examined, no change in status, stable for surgery.  I have reviewed the patient's chart and labs.  Questions were answered to the patient's satisfaction.     Pricilla Riffle. Fuller Plan

## 2022-06-06 ENCOUNTER — Other Ambulatory Visit: Payer: Self-pay

## 2022-06-06 ENCOUNTER — Telehealth: Payer: Self-pay

## 2022-06-06 ENCOUNTER — Encounter: Payer: Self-pay | Admitting: Gastroenterology

## 2022-06-06 ENCOUNTER — Emergency Department (HOSPITAL_COMMUNITY): Payer: Medicare Other

## 2022-06-06 ENCOUNTER — Inpatient Hospital Stay (HOSPITAL_COMMUNITY)
Admission: EM | Admit: 2022-06-06 | Discharge: 2022-06-08 | DRG: 896 | Disposition: A | Discharge status: Home / Self Care | Payer: Medicare Other | Attending: Internal Medicine | Admitting: Internal Medicine

## 2022-06-06 DIAGNOSIS — R479 Unspecified speech disturbances: Secondary | ICD-10-CM

## 2022-06-06 DIAGNOSIS — R29818 Other symptoms and signs involving the nervous system: Secondary | ICD-10-CM | POA: Diagnosis not present

## 2022-06-06 DIAGNOSIS — Z9049 Acquired absence of other specified parts of digestive tract: Secondary | ICD-10-CM

## 2022-06-06 DIAGNOSIS — I10 Essential (primary) hypertension: Secondary | ICD-10-CM | POA: Diagnosis present

## 2022-06-06 DIAGNOSIS — E872 Acidosis, unspecified: Secondary | ICD-10-CM | POA: Diagnosis present

## 2022-06-06 DIAGNOSIS — E538 Deficiency of other specified B group vitamins: Secondary | ICD-10-CM | POA: Diagnosis present

## 2022-06-06 DIAGNOSIS — Z888 Allergy status to other drugs, medicaments and biological substances status: Secondary | ICD-10-CM

## 2022-06-06 DIAGNOSIS — G43109 Migraine with aura, not intractable, without status migrainosus: Secondary | ICD-10-CM | POA: Diagnosis present

## 2022-06-06 DIAGNOSIS — Z8249 Family history of ischemic heart disease and other diseases of the circulatory system: Secondary | ICD-10-CM

## 2022-06-06 DIAGNOSIS — R4701 Aphasia: Secondary | ICD-10-CM | POA: Diagnosis present

## 2022-06-06 DIAGNOSIS — D539 Nutritional anemia, unspecified: Secondary | ICD-10-CM | POA: Diagnosis present

## 2022-06-06 DIAGNOSIS — Z981 Arthrodesis status: Secondary | ICD-10-CM

## 2022-06-06 DIAGNOSIS — K920 Hematemesis: Secondary | ICD-10-CM | POA: Diagnosis not present

## 2022-06-06 DIAGNOSIS — R519 Headache, unspecified: Secondary | ICD-10-CM | POA: Diagnosis not present

## 2022-06-06 DIAGNOSIS — E039 Hypothyroidism, unspecified: Secondary | ICD-10-CM | POA: Diagnosis present

## 2022-06-06 DIAGNOSIS — R7989 Other specified abnormal findings of blood chemistry: Secondary | ICD-10-CM | POA: Diagnosis not present

## 2022-06-06 DIAGNOSIS — R569 Unspecified convulsions: Secondary | ICD-10-CM | POA: Diagnosis not present

## 2022-06-06 DIAGNOSIS — G9341 Metabolic encephalopathy: Secondary | ICD-10-CM | POA: Diagnosis present

## 2022-06-06 DIAGNOSIS — Z79899 Other long term (current) drug therapy: Secondary | ICD-10-CM

## 2022-06-06 DIAGNOSIS — Z79890 Hormone replacement therapy (postmenopausal): Secondary | ICD-10-CM

## 2022-06-06 DIAGNOSIS — K219 Gastro-esophageal reflux disease without esophagitis: Secondary | ICD-10-CM | POA: Diagnosis present

## 2022-06-06 DIAGNOSIS — Z886 Allergy status to analgesic agent status: Secondary | ICD-10-CM

## 2022-06-06 DIAGNOSIS — F10139 Alcohol abuse with withdrawal, unspecified: Principal | ICD-10-CM | POA: Diagnosis present

## 2022-06-06 DIAGNOSIS — Y900 Blood alcohol level of less than 20 mg/100 ml: Secondary | ICD-10-CM | POA: Diagnosis present

## 2022-06-06 DIAGNOSIS — F1010 Alcohol abuse, uncomplicated: Secondary | ICD-10-CM

## 2022-06-06 DIAGNOSIS — K2931 Chronic superficial gastritis with bleeding: Secondary | ICD-10-CM

## 2022-06-06 DIAGNOSIS — E785 Hyperlipidemia, unspecified: Secondary | ICD-10-CM | POA: Diagnosis present

## 2022-06-06 DIAGNOSIS — M1611 Unilateral primary osteoarthritis, right hip: Secondary | ICD-10-CM | POA: Diagnosis present

## 2022-06-06 DIAGNOSIS — Z96641 Presence of right artificial hip joint: Secondary | ICD-10-CM | POA: Diagnosis present

## 2022-06-06 DIAGNOSIS — R531 Weakness: Secondary | ICD-10-CM | POA: Diagnosis not present

## 2022-06-06 DIAGNOSIS — M810 Age-related osteoporosis without current pathological fracture: Secondary | ICD-10-CM | POA: Diagnosis present

## 2022-06-06 DIAGNOSIS — R011 Cardiac murmur, unspecified: Secondary | ICD-10-CM | POA: Diagnosis present

## 2022-06-06 DIAGNOSIS — Z882 Allergy status to sulfonamides status: Secondary | ICD-10-CM

## 2022-06-06 DIAGNOSIS — F10931 Alcohol use, unspecified with withdrawal delirium: Secondary | ICD-10-CM

## 2022-06-06 LAB — SURGICAL PATHOLOGY

## 2022-06-06 LAB — PROTIME-INR
INR: 1.1 (ref 0.8–1.2)
Prothrombin Time: 13.9 seconds (ref 11.4–15.2)

## 2022-06-06 LAB — CBC
HCT: 35.7 % — ABNORMAL LOW (ref 36.0–46.0)
HCT: 37.1 % (ref 36.0–46.0)
Hemoglobin: 11.6 g/dL — ABNORMAL LOW (ref 12.0–15.0)
Hemoglobin: 12.4 g/dL (ref 12.0–15.0)
MCH: 35.9 pg — ABNORMAL HIGH (ref 26.0–34.0)
MCH: 36.9 pg — ABNORMAL HIGH (ref 26.0–34.0)
MCHC: 32.5 g/dL (ref 30.0–36.0)
MCHC: 33.4 g/dL (ref 30.0–36.0)
MCV: 110.5 fL — ABNORMAL HIGH (ref 80.0–100.0)
MCV: 110.4 fL — ABNORMAL HIGH (ref 80.0–100.0)
Platelets: 125 10*3/uL — ABNORMAL LOW (ref 150–400)
Platelets: 125 10*3/uL — ABNORMAL LOW (ref 150–400)
RBC: 3.23 MIL/uL — ABNORMAL LOW (ref 3.87–5.11)
RBC: 3.36 MIL/uL — ABNORMAL LOW (ref 3.87–5.11)
RDW: 12.6 % (ref 11.5–15.5)
RDW: 12.6 % (ref 11.5–15.5)
WBC: 4.1 10*3/uL (ref 4.0–10.5)
WBC: 5.4 10*3/uL (ref 4.0–10.5)
nRBC: 0 % (ref 0.0–0.2)
nRBC: 0 % (ref 0.0–0.2)

## 2022-06-06 LAB — COMPREHENSIVE METABOLIC PANEL
ALT: 53 U/L — ABNORMAL HIGH (ref 0–44)
AST: 62 U/L — ABNORMAL HIGH (ref 15–41)
Albumin: 4 g/dL (ref 3.5–5.0)
Alkaline Phosphatase: 75 U/L (ref 38–126)
Anion gap: 13 (ref 5–15)
BUN: 7 mg/dL — ABNORMAL LOW (ref 8–23)
CO2: 19 mmol/L — ABNORMAL LOW (ref 22–32)
Calcium: 9.1 mg/dL (ref 8.9–10.3)
Chloride: 105 mmol/L (ref 98–111)
Creatinine, Ser: 0.99 mg/dL (ref 0.44–1.00)
GFR, Estimated: >60 mL/min (ref >60)
Glucose, Bld: 123 mg/dL — ABNORMAL HIGH (ref 70–99)
Potassium: 3.8 mmol/L (ref 3.5–5.1)
Sodium: 137 mmol/L (ref 135–145)
Total Bilirubin: 1 mg/dL (ref 0.3–1.2)
Total Protein: 6.5 g/dL (ref 6.5–8.1)

## 2022-06-06 LAB — BASIC METABOLIC PANEL
Anion gap: 5 (ref 5–15)
BUN: 7 mg/dL — ABNORMAL LOW (ref 8–23)
CO2: 24 mmol/L (ref 22–32)
Calcium: 8.1 mg/dL — ABNORMAL LOW (ref 8.9–10.3)
Chloride: 111 mmol/L (ref 98–111)
Creatinine, Ser: 0.84 mg/dL (ref 0.44–1.00)
GFR, Estimated: >60 mL/min (ref >60)
Glucose, Bld: 109 mg/dL — ABNORMAL HIGH (ref 70–99)
Potassium: 4.3 mmol/L (ref 3.5–5.1)
Sodium: 140 mmol/L (ref 135–145)

## 2022-06-06 LAB — DIFFERENTIAL
Abs Immature Granulocytes: 0.01 10*3/uL (ref 0.00–0.07)
Basophils Absolute: 0 10*3/uL (ref 0.0–0.1)
Basophils Relative: 1 %
Eosinophils Absolute: 0 10*3/uL (ref 0.0–0.5)
Eosinophils Relative: 1 %
Immature Granulocytes: 0 %
Lymphocytes Relative: 31 %
Lymphs Abs: 1.7 10*3/uL (ref 0.7–4.0)
Monocytes Absolute: 0.5 10*3/uL (ref 0.1–1.0)
Monocytes Relative: 10 %
Neutro Abs: 3.1 10*3/uL (ref 1.7–7.7)
Neutrophils Relative %: 57 %

## 2022-06-06 LAB — CBG MONITORING, ED: Glucose-Capillary: 127 mg/dL — ABNORMAL HIGH (ref 70–99)

## 2022-06-06 LAB — APTT: aPTT: 31 seconds (ref 24–36)

## 2022-06-06 LAB — AMMONIA: Ammonia: 26 umol/L (ref 9–35)

## 2022-06-06 LAB — ETHANOL: Alcohol, Ethyl (B): <10 mg/dL (ref <10)

## 2022-06-06 MED ORDER — CYANOCOBALAMIN 1000 MCG PO TABS: 1000.0000 ug | ORAL_TABLET | Freq: Every day | ORAL | 3 refills | Status: DC

## 2022-06-06 MED ORDER — VITAMIN B-12 1000 MCG PO TABS
1000.0000 ug | ORAL_TABLET | Freq: Every day | ORAL | Status: DC
  Administered 2022-06-06: 1000 ug via ORAL
  Filled 2022-06-06: qty 1

## 2022-06-06 MED ORDER — IOHEXOL 350 MG/ML SOLN
100.0000 mL | Freq: Once | INTRAVENOUS | Status: AC | PRN
  Administered 2022-06-06: 100 mL via INTRAVENOUS

## 2022-06-06 MED ORDER — PANTOPRAZOLE SODIUM 40 MG PO TBEC: 40.0000 mg | DELAYED_RELEASE_TABLET | Freq: Two times a day (BID) | ORAL | 2 refills | Status: AC

## 2022-06-06 MED ORDER — LEVOTHYROXINE SODIUM 125 MCG PO TABS: 125.0000 ug | ORAL_TABLET | Freq: Every day | ORAL | 2 refills | Status: DC

## 2022-06-06 MED ORDER — SODIUM CHLORIDE 0.9% FLUSH: 3.0000 mL | Freq: Once | INTRAVENOUS | Status: DC

## 2022-06-06 MED ORDER — METOCLOPRAMIDE HCL 5 MG/ML IJ SOLN
10.0000 mg | Freq: Once | INTRAMUSCULAR | Status: AC
  Administered 2022-06-06: 10 mg via INTRAVENOUS
  Filled 2022-06-06: qty 2

## 2022-06-06 MED ORDER — VITAMIN B-1 100 MG PO TABS: 100.0000 mg | ORAL_TABLET | Freq: Every day | ORAL | 1 refills | Status: AC

## 2022-06-06 NOTE — Anesthesia Postprocedure Evaluation (Signed)
Anesthesia Post Note  Patient: Brittany Washington  Procedure(s) Performed: ESOPHAGOGASTRODUODENOSCOPY (EGD) WITH PROPOFOL BIOPSY HOT HEMOSTASIS (ARGON PLASMA COAGULATION/BICAP)     Patient location during evaluation: PACU Anesthesia Type: MAC Level of consciousness: awake and alert Pain management: pain level controlled Vital Signs Assessment: post-procedure vital signs reviewed and stable Respiratory status: spontaneous breathing, nonlabored ventilation, respiratory function stable and patient connected to nasal cannula oxygen Cardiovascular status: stable and blood pressure returned to baseline Postop Assessment: no apparent nausea or vomiting Anesthetic complications: no   No notable events documented.  Last Vitals:  Vitals:   06/06/22 0800 06/06/22 1156  BP: (!) 141/85 (!) 150/87  Pulse: 76 94  Resp: 18 20  Temp: 36.9 C 36.5 C  SpO2: 99% 100%    Last Pain:  Vitals:   06/06/22 1156  TempSrc: Oral  PainSc: 0-No pain   Pain Goal: Patients Stated Pain Goal: 2 (06/06/22 0744)                 Tiajuana Amass

## 2022-06-06 NOTE — Consult Note (Addendum)
Neurology Consultation Reason for Consult: Difficulty speaking Referring Physician: Robyn Haber  CC: Difficulty speaking  History is obtained from: EMS, patient  HPI: Brittany Washington is a 71 y.o. female with a history of hypothyroidism, recent GI bleed who presents with difficulty speaking.  She was just discharged for GI bleeding due to gastritis earlier today.  She apparently was doing okay at home, but her husband noticed that she was confused around 7:30 PM. On EMS arrival, they felt that she was aphasic and therefore code stroke was activated.   Also, she endorses a left-sided unilateral headache with photophobia. She has had similar dizziness with headaches in the past and saw Dr. Jannifer Franklin for it.   LKW: Unclear, definitely normal when she was discharged this morning around 10 AM. tpa given?: no, recent GI bleed   Past Medical History:  Diagnosis Date   Benign hypertensive heart disease without heart failure    GERD (gastroesophageal reflux disease)    Heart murmur, systolic    MECHNICAL   Hypertension    mild, lost weight, no meds now   Hypothyroidism    Osteoporosis    Primary osteoarthritis of right hip 06/18/2018     Family History  Problem Relation Age of Onset   Heart attack Father    Colon cancer Father    Heart attack Brother    Ovarian cancer Mother    Rectal cancer Neg Hx    Stomach cancer Neg Hx    Breast cancer Neg Hx      Social History:  reports that she has never smoked. She has never used smokeless tobacco. She reports current alcohol use of about 7.0 standard drinks of alcohol per week. She reports that she does not use drugs.   Exam: Current vital signs: Wt 70.6 kg   BMI 26.72 kg/m  Vital signs in last 24 hours: Temp:  [97.7 F (36.5 C)-98.4 F (36.9 C)] 97.7 F (36.5 C) (10/03 1156) Pulse Rate:  [76-94] 80 (10/03 1250) Resp:  [17-20] 17 (10/03 1250) BP: (141-155)/(83-90) 142/83 (10/03 1250) SpO2:  [97 %-100 %] 100 % (10/03  1156) Weight:  [70.6 kg] 70.6 kg (10/03 2119)   Physical Exam  Constitutional: Appears well-developed and well-nourished.  Psych: Affect appropriate to situation Eyes: No scleral injection HENT: No OP obstruction MSK: no joint deformities.  Cardiovascular: Normal rate and regular rhythm.  Respiratory: Effort normal, non-labored breathing GI: Soft.  No distension. There is no tenderness.  Skin: WDI  Neuro: Mental Status: Patient is awake, alert, she has very hesitant speech, though she is extremely expressive with her hands.  When I ask her what month it is, she draws and O, then a C in the air.  When asked her how old she is she draws 5 in the air.  When I ask and wait, she is able to tell me her name. Cranial Nerves: II: Visual Fields are full. Pupils are equal, round, and reactive to light.   III,IV, VI: EOMI without ptosis or diploplia.  V: Facial sensation is symmetric to temperature VII: Facial movement is symmetric.  VIII: hearing is intact to voice X: Uvula elevates symmetrically XI: Shoulder shrug is symmetric. XII: tongue is midline without atrophy or fasciculations.  Motor: She has giveaway weakness of the right lower extremity, but with repeated prompting she is able to hold it up against drift.  No drift in her left upper or left lower extremity Sensory: Sensation is symmetric to light touch and temperature in the  arms and legs. Cerebellar: Does not perform     I have reviewed labs in epic and the results pertinent to this consultation are: Creatinine 0.98  I have reviewed the images obtained: CT head-negative  Impression: 71 year old female with relatively abrupt onset difficulty speaking.  She is not a tenecteplase candidate due to recent GI bleeding.  Her exam is unusual, and I do wonder about possible stress related symptoms, and also with the description of unilateral headache with photophobia and there is a possibility that complicated migraine could be  playing a role.  Without history, however she does need further work-up and I would favor CTA/P, and if this is negative considering MRI brain.  Her exam does not seem typical of metabolic encephalopathy, though with her recent GI bleeding I do think pneumonia to check for hyperammonemia due to GI bleed/hepatitis to be prudent.  Also with her TSH being high, hypothyroidism in the setting of acute physiological stressor can cause a fairly precipitous mental status change.  Recommendations: 1) CT A/P 2) if no LVO, then MRI brain 3) CMP, ammonia 4) if metabolic and imaging work-up is negative, consider treatment for complicated migraine, if she improves think we could stop there.   Roland Rack, MD [facility] [phone]  If 7pm- 7am, please page neurology on call as listed in [place].

## 2022-06-06 NOTE — TOC Transition Note (Signed)
Transition of Care [facility]) - CM/SW Discharge Note   Patient Details  Name: Brittany Washington MRN: [id number] Date of Birth: [birth year]  [facility]) CM/SW Contact:  Roseanne Kaufman, RN Phone Number: 06/06/2022, 9:59 AM   Clinical Narrative:   Received TOC consult for SA resources. Spoke with patient regarding substance abuse resources, patient declined resources. Patient reports her brother is a Social worker ad is apart of AA and he will help guide her with her wine/ SA. Patient's husband will transport her home at discharge.   [facility] [address] [facility] [place]  No additional TOC needs at this time.    Final next level of care: Home/Self Care Barriers to Discharge: No Barriers Identified   Patient Goals and CMS Choice Patient states their goals for this hospitalization and ongoing recovery are:: return home with selfcare   Choice offered to / list presented to : NA  Discharge Placement            Home with self care           Discharge Plan and Services In-house Referral: NA Discharge Planning Services: CM Consult Post Acute Care Choice: NA          DME Arranged: N/A DME Agency: NA       HH Arranged: NA HH Agency: NA        Social Determinants of Health (SDOH) Interventions Food Insecurity Interventions: Intervention Not Indicated Housing Interventions: Intervention Not Indicated Utilities Interventions: Intervention Not Indicated   Readmission Risk Interventions     No data to display

## 2022-06-06 NOTE — Telephone Encounter (Addendum)
-----   Message from Levin Erp, [place] sent at 06/06/2022  8:58 AM EDT ----- Regarding: appt This patient is being discharged today after hematemesis and dark stools.  She will need follow-up with Dr. Henrene Pastor in the next couple of months when he has availability.  Thanks, JL L

## 2022-06-06 NOTE — Plan of Care (Signed)

## 2022-06-06 NOTE — ED Triage Notes (Signed)
Patient arrives to ED BIB [facility] as a Code Stroke. Per EMS patient was discharged today after being seen for GI Bleeding and LKW was 1930. Per EMS patients husbands states patient was complaining of a headache, confusion and difficulty speaking. EDP and Neurologist present upon patients arrival.  BP 177/91 HR 82 CBG 116

## 2022-06-06 NOTE — TOC Initial Note (Signed)
Transition of Care [facility]) - Initial/Assessment Note    Patient Details  Name: Brittany Washington MRN: [id number] Date of Birth: Aug 02, 1951  Transition [facility]) CM/SW Contact:    Roseanne Kaufman, RN Phone Number: 06/06/2022, 9:58 AM  Clinical Narrative:     Received TOC consult for SA resources.               Expected Discharge Plan: Home/Self Care Barriers to Discharge: No Barriers Identified   Patient Goals and CMS Choice Patient states their goals for this hospitalization and ongoing recovery are:: return home with selfcare   Choice offered to / list presented to : NA  Expected Discharge Plan and Services Expected Discharge Plan: Home/Self Care In-house Referral: NA Discharge Planning Services: CM Consult Post Acute Care Choice: NA Living arrangements for the past 2 months: Single Family Home Expected Discharge Date: 06/06/22               DME Arranged: N/A DME Agency: NA       HH Arranged: NA HH Agency: NA        Prior Living Arrangements/Services Living arrangements for the past 2 months: Single Family Home Lives with:: Spouse Patient language and need for interpreter reviewed:: Yes Do you feel safe going back to the place where you live?: Yes      Need for Family Participation in Patient Care: No (Comment) Care giver support system in place?: Yes (comment) Current home services: Other (comment) (none) Criminal Activity/Legal Involvement Pertinent to Current Situation/Hospitalization: No - Comment as needed  Activities of Daily Living Home Assistive Devices/Equipment: None ADL Screening (condition at time of admission) Patient's cognitive ability adequate to safely complete daily activities?: Yes Is the patient deaf or have difficulty hearing?: No Does the patient have difficulty seeing, even when wearing glasses/contacts?: No Does the patient have difficulty concentrating, remembering, or making decisions?: No Patient able to express need for  assistance with ADLs?: Yes Does the patient have difficulty dressing or bathing?: No Independently performs ADLs?: Yes (appropriate for developmental age) Does the patient have difficulty walking or climbing stairs?: No Weakness of Legs: Both Weakness of Arms/Hands: None  Permission Sought/Granted Permission sought to share information with : Case Manager Permission granted to share information with : Yes, Verbal Permission Granted  Share Information with NAME: Case Manager           Emotional Assessment Appearance:: Appears stated age Attitude/Demeanor/Rapport: Gracious Affect (typically observed): Accepting Orientation: : Oriented to Self, Oriented to Place, Oriented to  Time, Oriented to Situation Alcohol / Substance Use: Alcohol Use Psych Involvement: No (comment)  Admission diagnosis:  Hematemesis [K92.0] Hematemesis with nausea [K92.0] Patient Active Problem List   Diagnosis Date Noted   Transaminitis    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   Steatosis of liver 03/10/2019   Superficial gastritis with hemorrhage    Hematemesis 03/09/2019   Abnormal LFTs (liver function tests) 03/09/2019   AKI (acute kidney injury) ([place]) 03/09/2019   Weight loss 03/09/2019   Primary osteoarthritis of right hip 06/18/2018   Acute appendicitis with perforation and peritoneal abscess 12/29/2016   Mitral regurgitation 01/24/2016   Stroke-like symptoms 06/25/2015   Headache 06/25/2015   Primary hypertension 06/25/2015   Expressive aphasia    Cough 06/25/2014   Multiple respiratory allergies 03/11/2014   Other abnormal blood chemistry 06/13/2011   Benign hypertensive heart disease without heart failure 06/13/2011   Hypothyroid 06/13/2011   Heart murmur 06/13/2011   PCP:  Charlane Ferretti, MD [facility]   [id number] [place] [place] [address] [place]Phone:  [phone]: [phone]    Social Determinants of Health (SDOH) Interventions Food Insecurity Interventions: Intervention Not Indicated Housing Interventions: Intervention Not Indicated Utilities Interventions: Intervention Not Indicated  Readmission Risk Interventions     No data to display

## 2022-06-06 NOTE — ED Notes (Signed)
Patient c.o that similar symptoms are coming back on. Patient is now presenting with HA, aphasia, and lips trembling. MD hoton made aware of patient's status change. MD to bedside to assess patient.

## 2022-06-06 NOTE — ED Provider Notes (Signed)
[facility] EMERGENCY DEPARTMENT Provider Note   CSN: [id number] Arrival date & time: 06/06/22  2113     History  Chief Complaint  Patient presents with   Code Stroke    AMARIS DELAFUENTE is a 71 y.o. female.  HPI Patient presented as code stroke.  Seen by myself and Dr. Leonel Ramsay upon arrival.  Reportedly discharged from hospital today after GI bleed.  Reportedly had a headache and confusion.  Reportedly at 7:30 PM was confused.  Came in as a code stroke.  Aphasia.  Complaining of headache.  On the left side.  Somewhat difficult to get history from.  Does appear to move all extremities.  Will speak her name and answer some questions.    Home Medications Prior to Admission medications   Medication Sig Start Date End Date Taking? Authorizing Provider  cetirizine (ZYRTEC) 10 MG tablet Take 10 mg by mouth at bedtime.     [provider]  Cholecalciferol (VITAMIN D3) 400 units CAPS Take 400 Units by mouth daily.    [provider]  clonazePAM (KLONOPIN) 1 MG tablet Take 1 tablet (1 mg total) by mouth at bedtime. Take 1-2 tablets by mouth daily at bedtime as needed for sleep. Patient taking differently: Take 2 mg by mouth at bedtime. 12/20/15   Nahser, Wonda Cheng, MD  cyanocobalamin 1000 MCG tablet Take 1 tablet (1,000 mcg total) by mouth daily. 06/06/22   Bonnielee Haff, MD  denosumab (PROLIA) 60 MG/ML SOSY injection Inject 60 mg into the skin every 6 (six) months.    [provider]  diphenhydrAMINE (BENADRYL) 25 MG tablet Take 25 mg by mouth at bedtime as needed for allergies.    [provider]  diphenoxylate-atropine (LOMOTIL) 2.5-0.025 MG tablet Take 1 tablet by mouth 4 (four) times daily as needed for diarrhea or loose stools.    [provider]  hydrochlorothiazide (MICROZIDE) 12.5 MG capsule TAKE 1 CAPSULE BY MOUTH DAILY. Patient taking differently: Take 12.5 mg by mouth in the morning. 09/21/15   Darlin Coco, MD   levothyroxine (SYNTHROID) 125 MCG tablet Take 1 tablet (125 mcg total) by mouth daily. 06/06/22   Bonnielee Haff, MD  methocarbamol (ROBAXIN) 500 MG tablet Take 500 mg by mouth 2 (two) times daily as needed for muscle spasms.    [provider]  metoprolol tartrate (LOPRESSOR) 25 MG tablet Take 12.5 mg by mouth 2 (two) times daily.    [provider]  ondansetron (ZOFRAN-ODT) 4 MG disintegrating tablet Take 4 mg by mouth every 6 (six) hours as needed for nausea or vomiting. 03/30/21   [provider]  pantoprazole (PROTONIX) 40 MG tablet Take 1 tablet (40 mg total) by mouth 2 (two) times daily. 06/06/22   Bonnielee Haff, MD  thiamine (VITAMIN B-1) 100 MG tablet Take 1 tablet (100 mg total) by mouth daily. 06/06/22   Bonnielee Haff, MD  Turmeric 500 MG CAPS Take 500 mg by mouth daily with breakfast.    [provider]  TYLENOL 500 MG tablet Take 500-1,000 mg by mouth every 8 (eight) hours as needed (for mild pain).    [provider]  valsartan (DIOVAN) 160 MG tablet Take 160 mg by mouth daily. 09/26/18   [provider]      Allergies    Sulfa antibiotics, Fosamax [alendronate], and Nsaids    Review of Systems   Review of Systems  Physical Exam Updated Vital Signs BP 128/73   Pulse 79   Temp  97.9 F (36.6 C) (Oral)   Resp 15   Wt 70.6 kg   SpO2 97%   BMI 26.72 kg/m  Physical Exam Vitals reviewed.  Constitutional:      Appearance: Normal appearance.  HENT:     Head: Atraumatic.  Eyes:     Extraocular Movements: Extraocular movements intact.     Pupils: Pupils are equal, round, and reactive to light.  Cardiovascular:     Rate and Rhythm: Regular rhythm.  Pulmonary:     Breath sounds: No wheezing.  Abdominal:     Tenderness: There is no abdominal tenderness.  Musculoskeletal:     Cervical back: Neck supple.  Skin:    Capillary Refill: Capillary refill takes less than 2 seconds.  Neurological:     Mental Status: She is  alert.     Comments: Questionable expressive aphasia.  Somewhat inconsistent exam.  Appears to be moving all extremities but also somewhat variable.  Complete NIH scoring done by neurology.     ED Results / Procedures / Treatments   Labs (all labs ordered are listed, but only abnormal results are displayed) Labs Reviewed  CBC - Abnormal; Notable for the following components:      Result Value   RBC 3.36 (*)    MCV 110.4 (*)    MCH 36.9 (*)    Platelets 125 (*)    All other components within normal limits  COMPREHENSIVE METABOLIC PANEL - Abnormal; Notable for the following components:   CO2 19 (*)    Glucose, Bld 123 (*)    BUN 7 (*)    AST 62 (*)    ALT 53 (*)    All other components within normal limits  CBG MONITORING, ED - Abnormal; Notable for the following components:   Glucose-Capillary 127 (*)    All other components within normal limits  PROTIME-INR  APTT  DIFFERENTIAL  ETHANOL  AMMONIA  I-STAT CHEM 8, ED    EKG None  Radiology CT ANGIO HEAD NECK W WO CM W PERF (CODE STROKE)  Result Date: 06/06/2022 CLINICAL DATA:  Aphasia and headache EXAM: CT ANGIOGRAPHY HEAD AND NECK CT PERFUSION BRAIN TECHNIQUE: Multidetector CT imaging of the head and neck was performed using the standard protocol during bolus administration of intravenous contrast. Multiplanar CT image reconstructions and MIPs were obtained to evaluate the vascular anatomy. Carotid stenosis measurements (when applicable) are obtained utilizing NASCET criteria, using the distal internal carotid diameter as the denominator. Multiphase CT imaging of the brain was performed following IV bolus contrast injection. Subsequent parametric perfusion maps were calculated using RAPID software. RADIATION DOSE REDUCTION: This exam was performed according to the departmental dose-optimization program which includes automated exposure control, adjustment of the mA and/or kV according to patient size and/or use of iterative  reconstruction technique. CONTRAST:  13m OMNIPAQUE IOHEXOL 350 MG/ML SOLN COMPARISON:  None Available. FINDINGS: CTA NECK FINDINGS SKELETON: There is no bony spinal canal stenosis. No lytic or blastic lesion. OTHER NECK: Normal pharynx, larynx and major salivary glands. No cervical lymphadenopathy. Unremarkable thyroid gland. UPPER CHEST: No pneumothorax or pleural effusion. No nodules or masses. AORTIC ARCH: There is no calcific atherosclerosis of the aortic arch. There is no aneurysm, dissection or hemodynamically significant stenosis of the visualized portion of the aorta. Conventional 3 vessel aortic branching pattern. The visualized proximal subclavian arteries are widely patent. RIGHT CAROTID SYSTEM: Normal without aneurysm, dissection or stenosis. LEFT CAROTID SYSTEM: Normal without aneurysm, dissection or stenosis. VERTEBRAL ARTERIES: Left dominant configuration. Both  origins are clearly patent. There is no dissection, occlusion or flow-limiting stenosis to the skull base (V1-V3 segments). CTA HEAD FINDINGS POSTERIOR CIRCULATION: --Vertebral arteries: Normal V4 segments. --Inferior cerebellar arteries: Normal. --Basilar artery: Normal. --Superior cerebellar arteries: Normal. --Posterior cerebral arteries (PCA): Normal. Both are predominantly supplied by the posterior communicating arteries (p-comm). ANTERIOR CIRCULATION: --Intracranial internal carotid arteries: Normal. --Anterior cerebral arteries (ACA): Normal. Both A1 segments are present. Patent anterior communicating artery (a-comm). --Middle cerebral arteries (MCA): Normal. VENOUS SINUSES: As permitted by contrast timing, patent. ANATOMIC VARIANTS: None Review of the MIP images confirms the above findings. CT Brain Perfusion Findings: ASPECTS: 10 CBF (<30%) Volume: 42m Perfusion (Tmax>6.0s) volume: 064mMismatch Volume: 4m58mnfarction Location:None IMPRESSION: 1. No emergent large vessel occlusion or high-grade stenosis of the intracranial arteries.  2. Normal CT perfusion. Electronically Signed   By: KevUlyses JarredD.   On: 06/06/2022 22:04   CT HEAD CODE STROKE WO CONTRAST  Result Date: 06/06/2022 CLINICAL DATA:  Code stroke.  Aphasia and right-sided weakness EXAM: CT HEAD WITHOUT CONTRAST TECHNIQUE: Contiguous axial images were obtained from the base of the skull through the vertex without intravenous contrast. RADIATION DOSE REDUCTION: This exam was performed according to the departmental dose-optimization program which includes automated exposure control, adjustment of the mA and/or kV according to patient size and/or use of iterative reconstruction technique. COMPARISON:  None Available. FINDINGS: Brain: There is no mass, hemorrhage or extra-axial collection. The size and configuration of the ventricles and extra-axial CSF spaces are normal. The brain parenchyma is normal, without evidence of acute or chronic infarction. Vascular: No abnormal hyperdensity of the major intracranial arteries or dural venous sinuses. No intracranial atherosclerosis. Skull: The visualized skull base, calvarium and extracranial soft tissues are normal. Sinuses/Orbits: No fluid levels or advanced mucosal thickening of the visualized paranasal sinuses. No mastoid or middle ear effusion. The orbits are normal. ASPECTS ([facility]roke Program Early CT Score) - Ganglionic level infarction (caudate, lentiform nuclei, internal capsule, insula, M1-M3 cortex): 7 - Supraganglionic infarction (M4-M6 cortex): 3 Total score (0-10 with 10 being normal): 10 IMPRESSION: 1. No acute intracranial abnormality. 2. ASPECTS is 10 3. These results were communicated to Dr. McNRoland Rack 9:28 pm on 06/06/2022 by text page via the [facility] system. Electronically Signed   By: KevUlyses JarredD.   On: 06/06/2022 21:28    Procedures Procedures    Medications Ordered in ED Medications  sodium chloride flush (NS) 0.9 % injection 3 mL (has no administration in time range)  iohexol  (OMNIPAQUE) 350 MG/ML injection 100 mL (100 mLs Intravenous Contrast Given 06/06/22 2132)  metoCLOPramide (REGLAN) injection 10 mg (10 mg Intravenous Given 06/06/22 2147)    ED Course/ Medical Decision Making/ A&P                           Medical Decision Making Amount and/or Complexity of Data Reviewed Labs: ordered. Radiology: ordered.   Patient came in as a code stroke.  Headache.  Somewhat inconsistent exam.  Reviewing previous neurology notes there is mention of headaches and potentially neurodeficits with it.  Head CT reassuring.  Fusion also reassuring.  Discussed with neurology and recommends migraine treatment and MRI.  If MRI is negative and symptoms resolve potentially discharge home.  If other metabolic cause found treat.   patient feeling better.  CTA and perfusion reassuring.  Discussed with Dr. KirLeonel RamsayPotentially complicated migraine and given Reglan.  Has had history of  same.  Feels better after treatment now.  Speech is clear.   neurology still requests MRI and if negative can discharge home.        Final Clinical Impression(s) / ED Diagnoses Final diagnoses:  Complicated migraine    Rx / DC Orders ED Discharge Orders     None         Davonna Belling, MD 06/06/22 2238

## 2022-06-06 NOTE — Progress Notes (Addendum)
    Progress Note   Subjective  Chief Complaint: Hematemesis/dark stools  Status post EGD yesterday with friable gastritis which was APC.  This morning patient tells me she had a normal solid bowel movement bowel movement this morning and feels well, no further hematemesis.  She is wanting to go home.    Objective   Vital signs in last 24 hours: Temp:  [97.7 F (36.5 C)-98.4 F (36.9 C)] 98.4 F (36.9 C) (10/03 0800) Pulse Rate:  [65-97] 76 (10/03 0800) Resp:  [11-20] 18 (10/03 0800) BP: (107-155)/(50-90) 141/85 (10/03 0800) SpO2:  [95 %-99 %] 99 % (10/03 0800) Weight:  [65.8 kg] 65.8 kg (10/02 1410) Last BM Date :  (Stated PTA) General:    white female in NAD Heart:  Regular rate and rhythm; no murmurs Lungs: Respirations even and unlabored, lungs CTA bilaterally Abdomen:  Soft, nontender and nondistended. Normal bowel sounds. Psych:  Cooperative. Normal mood and affect.  Intake/Output from previous day: 10/02 0701 - 10/03 0700 In: 1065.3 [P.O.:240; I.V.:785.6; IV Piggyback:39.7] Out: -  Intake/Output this shift: Total I/O In: 240 [P.O.:240] Out: -   Lab Results: Recent Labs    06/05/22 0402 06/05/22 1637 06/06/22 0344  WBC 4.5 4.3 4.1  HGB 11.7* 11.3* 11.6*  HCT 34.9* 34.6* 35.7*  PLT 133* 131* 125*   BMET Recent Labs    06/04/22 1253 06/05/22 0402 06/06/22 0344  NA 132* 136 140  K 3.7 3.2* 4.3  CL 97* 106 111  CO2 19* 21* 24  GLUCOSE 110* 99 109*  BUN 19 11 7*  CREATININE 1.11* 0.98 0.84  CALCIUM 9.3 8.1* 8.1*   LFT Recent Labs    06/05/22 0402  PROT 6.0*  ALBUMIN 3.6  AST 70*  ALT 62*  ALKPHOS 69  BILITOT 1.1   PT/INR Recent Labs    06/04/22 1253  LABPROT 14.5  INR 1.1     Assessment / Plan:    Assessment: 1.  Hematemesis and dark stools: Hemoglobin stable, no further episodes, EGD yesterday with mild gastritis and minimal oozing treated with APC 2.  Mild transaminitis: Likely steatosis, alcohol related 3.  Status  postcholecystectomy  Plan: 1.  Reviewed EGD yesterday with gastritis and minimal oozing treated with APC. 2.  Recommend patient reduce or discontinue alcohol use 3.  Continue PPI bid for 2 weeks, then PPI qd until follow up with Dr. Henrene Pastor 4.  Patient will be arranged for follow-up with Dr. Henrene Pastor in the next couple of months  Okay with patient discharge today from GI standpoint.  We will sign off.   LOS: 1 day   Levin Erp  06/06/2022, 8:55 AM     Attending Physician Note   I have taken an interval history, reviewed the chart and examined the patient. I performed a substantive portion of this encounter, including complete performance of at least one of the key components, in conjunction with the APP. I agree with the APP's note, impression and recommendations with my edits.    Lucio Edward, MD [facility] See AM[facility], [facility], for our on call provider

## 2022-06-06 NOTE — Telephone Encounter (Signed)
Patient has been scheduled for an appointment with Dr. Henrene Pastor on Thursday, 07/06/22 at 9:00 am. Appt information will be added to patient's hospital discharge summary. Appt information sent to patient via MyChart and mailed today.

## 2022-06-06 NOTE — Discharge Summary (Signed)
[facility]  Physician Discharge Summary   Patient ID: Brittany Washington MRN: [id number] DOB/AGE: 1950/11/09 71 y.o.  Admit date: 06/04/2022 Discharge date:   06/06/2022   PCP: Charlane Ferretti, MD  DISCHARGE DIAGNOSES:    Hematemesis secondary to gastritis   Abnormal LFTs (liver function tests)   Primary hypertension   Superficial gastritis with hemorrhage Hypothyroidism   RECOMMENDATIONS FOR OUTPATIENT FOLLOW UP: Gastroenterology to follow-up biopsy results Gastroenterology to arrange outpatient follow-up Will need repeat thyroid function test in 3 to 4 weeks.    Home Health: None Equipment/Devices: None  CODE STATUS: Full code  DISCHARGE CONDITION: fair  Diet recommendation: Bland diet for few days  INITIAL HISTORY: 71 y.o. female with a past medical history of chronic back pain, essential hypertension, hypothyroidism, osteoporosis, previous history of GI bleed secondary to gastritis from NSAIDs, who was in her usual state of health at about 10:00 on the morning of admission when she suddenly experienced nausea followed by back-to-back episodes of hematemesis.  She had 5 such episodes.  She presented to the hospital.  She was hospitalized for further management.  GI was consulted.     Consultants: [facility]   Procedures:  EGD Impression:               - Normal esophagus.                           - Gastritis with minimal oozing. Biopsied. Treated                            with argon plasma coagulation (APC).                           - Normal duodenal bulb and second portion of the                            duodenum.    HOSPITAL COURSE:   Hematemesis secondary to gastritis Patient was seen by gastroenterology.  Underwent upper endoscopy which showed gastritis with minimal oozing.  It was biopsied.  Treated with APC.  Hemoglobin is stable.  No further episodes of hematemesis.  She had a brown stool yesterday.  Feels much better this morning.   Cleared by GI for discharge.  She will be discharged on twice daily PPI.   GI to follow-up on results of biopsy.     Acute blood loss anemia/ Macrocytosis There was a drop in hemoglobin but not significant.  Stable this morning.  Did not require blood transfusion.    Vitamin B12 deficiency Anemia panel does suggest B12 deficiency.  This will be supplemented.   Hyponatremia Improved with IV fluids.   Hypokalemia Repleted   Essential hypertension May resume home medications   Abnormal LFTs Likely due to combination of hepatic steatosis as well as alcohol use. Seen previously as well.  Hepatitis panel was unremarkable in 2020.  Ultrasound of the hepatobiliary system in 2020 showed hepatic steatosis.  She is followed by gastroenterology.  This can be addressed in the outpatient setting. LFTs have improved.  She was told to stop consuming alcohol.   Alcohol abuse Patient mentioned drinking 2 glasses of wine per night. When asked if she consumes more than that at times she denied.  Patient was noted to be tremulous yesterday.  She was placed  on CIWA protocol.  Thiamine folic acid and multivitamins. Did not have any withdrawal symptoms.  Counseled.   Hypothyroidism TSH noted to be greater than 10.  Dose of levothyroxine has been increased.  This was discussed with the patient.   Chronic back pain She has had multiple back surgeries.  No neurological deficits noted.  Patient is stable.  Okay for discharge home today after she has tolerated a soft diet.  PERTINENT LABS:  The results of significant diagnostics from this hospitalization (including imaging, microbiology, ancillary and laboratory) are listed below for reference.     Labs:   Basic Metabolic Panel: Recent Labs  Lab 06/04/22 1253 06/05/22 0402 06/05/22 0654 06/06/22 0344  NA 132* 136  --  140  K 3.7 3.2*  --  4.3  CL 97* 106  --  111  CO2 19* 21*  --  24  GLUCOSE 110* 99  --  109*  BUN 19 11  --  7*   CREATININE 1.11* 0.98  --  0.84  CALCIUM 9.3 8.1*  --  8.1*  MG  --   --  1.7  --    Liver Function Tests: Recent Labs  Lab 06/04/22 1253 06/05/22 0402  AST 124* 70*  ALT 94* 62*  ALKPHOS 87 69  BILITOT 0.6 1.1  PROT 7.6 6.0*  ALBUMIN 4.5 3.6    CBC: Recent Labs  Lab 06/04/22 1253 06/05/22 0402 06/05/22 1637 06/06/22 0344  WBC 5.9 4.5 4.3 4.1  NEUTROABS 4.0  --   --   --   HGB 13.7 11.7* 11.3* 11.6*  HCT 39.0 34.9* 34.6* 35.7*  MCV 103.7* 108.0* 110.2* 110.5*  PLT 193 133* 131* 125*      IMAGING STUDIES No results found.  DISCHARGE EXAMINATION: Vitals:   06/06/22 0714 06/06/22 0744 06/06/22 0800 06/06/22 1156  BP:   (!) 141/85 (!) 150/87  Pulse:   76 94  Resp: '20 17 18 20  '$ Temp:   98.4 F (36.9 C) 97.7 F (36.5 C)  TempSrc:   Oral Oral  SpO2:   99% 100%  Weight:      Height:       General appearance: Awake alert.  In no distress Resp: Clear to auscultation bilaterally.  Normal effort Cardio: S1-S2 is normal regular.  No S3-S4.  No rubs murmurs or bruit GI: Abdomen is soft.  Nontender nondistended.  Bowel sounds are present normal.  No masses organomegaly Extremities: No edema.  Full range of motion of lower extremities. Neurologic: Alert and oriented x3.  No focal neurological deficits.    DISPOSITION: Home  Discharge Instructions     Call MD for:  difficulty breathing, headache or visual disturbances   Complete by: As directed    Call MD for:  extreme fatigue   Complete by: As directed    Call MD for:  persistant dizziness or light-headedness   Complete by: As directed    Call MD for:  persistant nausea and vomiting   Complete by: As directed    Call MD for:  severe uncontrolled pain   Complete by: As directed    Call MD for:  temperature >100.4   Complete by: As directed    Discharge instructions   Complete by: As directed    Please take your medications as prescribed.  Please be sure to follow-up with your primary care provider in 1  week.  A follow-up has been made with the Dr. Blanch Media office.  Eat a soft bland  diet for the next few days and then you may return to your usual low-sodium diet.  It would be beneficial to your health if you could significantly cut back on your alcohol consumption.  You were cared for by a hospitalist during your hospital stay. If you have any questions about your discharge medications or the care you received while you were in the hospital after you are discharged, you can call the unit and asked to speak with the hospitalist on call if the hospitalist that took care of you is not available. Once you are discharged, your primary care physician will handle any further medical issues. Please note that NO REFILLS for any discharge medications will be authorized once you are discharged, as it is imperative that you return to your primary care physician (or establish a relationship with a primary care physician if you do not have one) for your aftercare needs so that they can reassess your need for medications and monitor your lab values. If you do not have a primary care physician, you can call [phone] for a physician referral.   Increase activity slowly   Complete by: As directed           Allergies as of 06/06/2022       Reactions   Sulfa Antibiotics Diarrhea, Other (See Comments)   Body aches    Fosamax [alendronate] Other (See Comments)   Heartburn, myalgias   Nsaids Other (See Comments)   History of GI bleeding        Medication List     TAKE these medications    cetirizine 10 MG tablet Commonly known as: ZYRTEC Take 10 mg by mouth at bedtime.   clonazePAM 1 MG tablet Commonly known as: KLONOPIN Take 1 tablet (1 mg total) by mouth at bedtime. Take 1-2 tablets by mouth daily at bedtime as needed for sleep. What changed:  how much to take additional instructions   cyanocobalamin 1000 MCG tablet Take 1 tablet (1,000 mcg total) by mouth daily.   denosumab 60 MG/ML Sosy  injection Commonly known as: PROLIA Inject 60 mg into the skin every 6 (six) months.   diphenhydrAMINE 25 MG tablet Commonly known as: BENADRYL Take 25 mg by mouth at bedtime as needed for allergies.   diphenoxylate-atropine 2.5-0.025 MG tablet Commonly known as: LOMOTIL Take 1 tablet by mouth 4 (four) times daily as needed for diarrhea or loose stools.   hydrochlorothiazide 12.5 MG capsule Commonly known as: MICROZIDE TAKE 1 CAPSULE BY MOUTH DAILY. What changed: when to take this   levothyroxine 125 MCG tablet Commonly known as: SYNTHROID Take 1 tablet (125 mcg total) by mouth daily. What changed:  medication strength how much to take   methocarbamol 500 MG tablet Commonly known as: ROBAXIN Take 500 mg by mouth 2 (two) times daily as needed for muscle spasms.   metoprolol tartrate 25 MG tablet Commonly known as: LOPRESSOR Take 12.5 mg by mouth 2 (two) times daily.   ondansetron 4 MG disintegrating tablet Commonly known as: ZOFRAN-ODT Take 4 mg by mouth every 6 (six) hours as needed for nausea or vomiting.   pantoprazole 40 MG tablet Commonly known as: PROTONIX Take 1 tablet (40 mg total) by mouth 2 (two) times daily. What changed: when to take this   thiamine 100 MG tablet Commonly known as: Vitamin B-1 Take 1 tablet (100 mg total) by mouth daily.   Turmeric 500 MG Caps Take 500 mg by mouth daily with breakfast.   TYLENOL 500  MG tablet Generic drug: acetaminophen Take 500-1,000 mg by mouth every 8 (eight) hours as needed (for mild pain).   valsartan 160 MG tablet Commonly known as: DIOVAN Take 160 mg by mouth daily.   Vitamin D3 10 MCG (400 UNIT) Caps Take 400 Units by mouth daily.          Follow-up Information     Irene Shipper, MD Follow up on 07/06/2022.   Specialty: Gastroenterology Why: at [place] information: [address] [place] [address] [phone]         Charlane Ferretti, MD. Schedule an appointment as soon as  possible for a visit in 1 week(s).   Specialty: Internal Medicine Why: post hospitalization follow up Contact information: [address] [place] [address] [phone]                 TOTAL DISCHARGE TIME: 2 minutes  [place]  [facility] Pager on [email or web address]  06/06/2022, 12:26 PM

## 2022-06-07 ENCOUNTER — Emergency Department (HOSPITAL_COMMUNITY): Payer: Medicare Other

## 2022-06-07 ENCOUNTER — Inpatient Hospital Stay (HOSPITAL_COMMUNITY): Payer: Medicare Other

## 2022-06-07 ENCOUNTER — Encounter (HOSPITAL_COMMUNITY): Payer: Self-pay | Admitting: Internal Medicine

## 2022-06-07 DIAGNOSIS — Z9049 Acquired absence of other specified parts of digestive tract: Secondary | ICD-10-CM | POA: Diagnosis not present

## 2022-06-07 DIAGNOSIS — M1611 Unilateral primary osteoarthritis, right hip: Secondary | ICD-10-CM | POA: Diagnosis present

## 2022-06-07 DIAGNOSIS — G9341 Metabolic encephalopathy: Secondary | ICD-10-CM | POA: Diagnosis present

## 2022-06-07 DIAGNOSIS — K219 Gastro-esophageal reflux disease without esophagitis: Secondary | ICD-10-CM | POA: Diagnosis present

## 2022-06-07 DIAGNOSIS — E872 Acidosis, unspecified: Secondary | ICD-10-CM | POA: Diagnosis present

## 2022-06-07 DIAGNOSIS — F1010 Alcohol abuse, uncomplicated: Secondary | ICD-10-CM | POA: Diagnosis not present

## 2022-06-07 DIAGNOSIS — R7989 Other specified abnormal findings of blood chemistry: Secondary | ICD-10-CM | POA: Diagnosis not present

## 2022-06-07 DIAGNOSIS — Y900 Blood alcohol level of less than 20 mg/100 ml: Secondary | ICD-10-CM | POA: Diagnosis present

## 2022-06-07 DIAGNOSIS — E538 Deficiency of other specified B group vitamins: Secondary | ICD-10-CM | POA: Diagnosis present

## 2022-06-07 DIAGNOSIS — E890 Postprocedural hypothyroidism: Secondary | ICD-10-CM | POA: Diagnosis not present

## 2022-06-07 DIAGNOSIS — R29818 Other symptoms and signs involving the nervous system: Secondary | ICD-10-CM | POA: Diagnosis not present

## 2022-06-07 DIAGNOSIS — Z96641 Presence of right artificial hip joint: Secondary | ICD-10-CM | POA: Diagnosis present

## 2022-06-07 DIAGNOSIS — Z8249 Family history of ischemic heart disease and other diseases of the circulatory system: Secondary | ICD-10-CM | POA: Diagnosis not present

## 2022-06-07 DIAGNOSIS — Z79899 Other long term (current) drug therapy: Secondary | ICD-10-CM | POA: Diagnosis not present

## 2022-06-07 DIAGNOSIS — Z981 Arthrodesis status: Secondary | ICD-10-CM | POA: Diagnosis not present

## 2022-06-07 DIAGNOSIS — R011 Cardiac murmur, unspecified: Secondary | ICD-10-CM | POA: Diagnosis present

## 2022-06-07 DIAGNOSIS — D539 Nutritional anemia, unspecified: Secondary | ICD-10-CM | POA: Diagnosis present

## 2022-06-07 DIAGNOSIS — G43109 Migraine with aura, not intractable, without status migrainosus: Secondary | ICD-10-CM | POA: Diagnosis present

## 2022-06-07 DIAGNOSIS — E785 Hyperlipidemia, unspecified: Secondary | ICD-10-CM | POA: Diagnosis present

## 2022-06-07 DIAGNOSIS — Z79890 Hormone replacement therapy (postmenopausal): Secondary | ICD-10-CM | POA: Diagnosis not present

## 2022-06-07 DIAGNOSIS — Z888 Allergy status to other drugs, medicaments and biological substances status: Secondary | ICD-10-CM | POA: Diagnosis not present

## 2022-06-07 DIAGNOSIS — Z882 Allergy status to sulfonamides status: Secondary | ICD-10-CM | POA: Diagnosis not present

## 2022-06-07 DIAGNOSIS — Z886 Allergy status to analgesic agent status: Secondary | ICD-10-CM | POA: Diagnosis not present

## 2022-06-07 DIAGNOSIS — I10 Essential (primary) hypertension: Secondary | ICD-10-CM | POA: Diagnosis present

## 2022-06-07 DIAGNOSIS — M810 Age-related osteoporosis without current pathological fracture: Secondary | ICD-10-CM | POA: Diagnosis present

## 2022-06-07 DIAGNOSIS — R569 Unspecified convulsions: Secondary | ICD-10-CM

## 2022-06-07 DIAGNOSIS — F10139 Alcohol abuse with withdrawal, unspecified: Secondary | ICD-10-CM | POA: Diagnosis present

## 2022-06-07 DIAGNOSIS — R4701 Aphasia: Secondary | ICD-10-CM | POA: Diagnosis present

## 2022-06-07 DIAGNOSIS — E039 Hypothyroidism, unspecified: Secondary | ICD-10-CM | POA: Diagnosis present

## 2022-06-07 LAB — COMPREHENSIVE METABOLIC PANEL
ALT: 53 U/L — ABNORMAL HIGH (ref 0–44)
AST: 60 U/L — ABNORMAL HIGH (ref 15–41)
Albumin: 3.6 g/dL (ref 3.5–5.0)
Alkaline Phosphatase: 68 U/L (ref 38–126)
Anion gap: 10 (ref 5–15)
BUN: 5 mg/dL — ABNORMAL LOW (ref 8–23)
CO2: 20 mmol/L — ABNORMAL LOW (ref 22–32)
Calcium: 8.5 mg/dL — ABNORMAL LOW (ref 8.9–10.3)
Chloride: 104 mmol/L (ref 98–111)
Creatinine, Ser: 0.99 mg/dL (ref 0.44–1.00)
GFR, Estimated: >60 mL/min (ref >60)
Glucose, Bld: 119 mg/dL — ABNORMAL HIGH (ref 70–99)
Potassium: 3.4 mmol/L — ABNORMAL LOW (ref 3.5–5.1)
Sodium: 134 mmol/L — ABNORMAL LOW (ref 135–145)
Total Bilirubin: 0.5 mg/dL (ref 0.3–1.2)
Total Protein: 6.2 g/dL — ABNORMAL LOW (ref 6.5–8.1)

## 2022-06-07 LAB — CBC
HCT: 33.7 % — ABNORMAL LOW (ref 36.0–46.0)
Hemoglobin: 11.4 g/dL — ABNORMAL LOW (ref 12.0–15.0)
MCH: 36.2 pg — ABNORMAL HIGH (ref 26.0–34.0)
MCHC: 33.8 g/dL (ref 30.0–36.0)
MCV: 107 fL — ABNORMAL HIGH (ref 80.0–100.0)
Platelets: 141 10*3/uL — ABNORMAL LOW (ref 150–400)
RBC: 3.15 MIL/uL — ABNORMAL LOW (ref 3.87–5.11)
RDW: 12.5 % (ref 11.5–15.5)
WBC: 6.2 10*3/uL (ref 4.0–10.5)
nRBC: 0 % (ref 0.0–0.2)

## 2022-06-07 LAB — PHOSPHORUS: Phosphorus: 1.9 mg/dL — ABNORMAL LOW (ref 2.5–4.6)

## 2022-06-07 LAB — MAGNESIUM: Magnesium: 2.2 mg/dL (ref 1.7–2.4)

## 2022-06-07 MED ORDER — LEVETIRACETAM IN NACL 1000 MG/100ML IV SOLN
1000.0000 mg | Freq: Two times a day (BID) | INTRAVENOUS | Status: DC
  Administered 2022-06-07 – 2022-06-08 (×3): 1000 mg via INTRAVENOUS
  Filled 2022-06-07 (×2): qty 100

## 2022-06-07 MED ORDER — THIAMINE HCL 100 MG/ML IJ SOLN
500.0000 mg | Freq: Once | INTRAVENOUS | Status: AC
  Administered 2022-06-07: 500 mg via INTRAVENOUS
  Filled 2022-06-07: qty 5

## 2022-06-07 MED ORDER — THIAMINE MONONITRATE 100 MG PO TABS
100.0000 mg | ORAL_TABLET | Freq: Every day | ORAL | Status: DC
  Administered 2022-06-07 – 2022-06-08 (×2): 100 mg via ORAL
  Filled 2022-06-07 (×2): qty 1

## 2022-06-07 MED ORDER — CHOLECALCIFEROL 10 MCG (400 UNIT) PO TABS
400.0000 [IU] | ORAL_TABLET | Freq: Every day | ORAL | Status: DC
  Administered 2022-06-07 – 2022-06-08 (×2): 400 [IU] via ORAL
  Filled 2022-06-07 (×2): qty 1

## 2022-06-07 MED ORDER — TURMERIC 500 MG PO CAPS: 500.0000 mg | ORAL_CAPSULE | Freq: Every day | ORAL | Status: DC

## 2022-06-07 MED ORDER — VITAMIN B-12 1000 MCG PO TABS
1000.0000 ug | ORAL_TABLET | Freq: Every day | ORAL | Status: DC
  Administered 2022-06-07 – 2022-06-08 (×2): 1000 ug via ORAL
  Filled 2022-06-07 (×2): qty 1

## 2022-06-07 MED ORDER — ENOXAPARIN SODIUM 40 MG/0.4ML IJ SOSY
40.0000 mg | PREFILLED_SYRINGE | INTRAMUSCULAR | Status: DC
  Administered 2022-06-07: 40 mg via SUBCUTANEOUS
  Filled 2022-06-07: qty 0.4

## 2022-06-07 MED ORDER — LORAZEPAM 2 MG/ML IJ SOLN: 2.0000 mg | Freq: Four times a day (QID) | INTRAMUSCULAR | Status: DC | PRN

## 2022-06-07 MED ORDER — LORATADINE 10 MG PO TABS
10.0000 mg | ORAL_TABLET | Freq: Every day | ORAL | Status: DC
  Administered 2022-06-07 – 2022-06-08 (×2): 10 mg via ORAL
  Filled 2022-06-07 (×2): qty 1

## 2022-06-07 MED ORDER — HYDROXYZINE HCL 10 MG PO TABS: 10.0000 mg | ORAL_TABLET | Freq: Three times a day (TID) | ORAL | Status: DC | PRN

## 2022-06-07 MED ORDER — LORAZEPAM 1 MG PO TABS: 0.0000 mg | ORAL_TABLET | Freq: Two times a day (BID) | ORAL | Status: DC

## 2022-06-07 MED ORDER — LEVOTHYROXINE SODIUM 25 MCG PO TABS
125.0000 ug | ORAL_TABLET | Freq: Every day | ORAL | Status: DC
  Administered 2022-06-08: 125 ug via ORAL
  Filled 2022-06-07: qty 1

## 2022-06-07 MED ORDER — LORAZEPAM 1 MG PO TABS
0.0000 mg | ORAL_TABLET | Freq: Four times a day (QID) | ORAL | Status: DC
  Administered 2022-06-07: 1 mg via ORAL
  Filled 2022-06-07: qty 1

## 2022-06-07 MED ORDER — ORAL CARE MOUTH RINSE: 15.0000 mL | OROMUCOSAL | Status: DC | PRN

## 2022-06-07 MED ORDER — PANTOPRAZOLE SODIUM 40 MG PO TBEC
40.0000 mg | DELAYED_RELEASE_TABLET | Freq: Two times a day (BID) | ORAL | Status: DC
  Administered 2022-06-07 – 2022-06-08 (×3): 40 mg via ORAL
  Filled 2022-06-07 (×3): qty 1

## 2022-06-07 MED ORDER — LORAZEPAM 2 MG/ML IJ SOLN
0.0000 mg | Freq: Four times a day (QID) | INTRAMUSCULAR | Status: DC
  Administered 2022-06-07: 1 mg via INTRAVENOUS

## 2022-06-07 MED ORDER — CLONAZEPAM 0.125 MG PO TBDP: 0.2500 mg | ORAL_TABLET | Freq: Two times a day (BID) | ORAL | Status: DC | PRN

## 2022-06-07 MED ORDER — CHLORDIAZEPOXIDE HCL 5 MG PO CAPS
10.0000 mg | ORAL_CAPSULE | Freq: Four times a day (QID) | ORAL | Status: DC
  Administered 2022-06-07 – 2022-06-08 (×6): 10 mg via ORAL
  Filled 2022-06-07 (×4): qty 2
  Filled 2022-06-07: qty 1
  Filled 2022-06-07: qty 2

## 2022-06-07 MED ORDER — LORAZEPAM 2 MG/ML IJ SOLN
INTRAMUSCULAR | Status: AC
  Administered 2022-06-07: 2 mg via INTRAVENOUS
  Filled 2022-06-07: qty 1

## 2022-06-07 MED ORDER — LORAZEPAM 2 MG/ML IJ SOLN
INTRAMUSCULAR | Status: AC
  Filled 2022-06-07: qty 1

## 2022-06-07 MED ORDER — THIAMINE HCL 100 MG/ML IJ SOLN: 100.0000 mg | Freq: Every day | INTRAMUSCULAR | Status: DC

## 2022-06-07 MED ORDER — SODIUM CHLORIDE 0.9 % IV SOLN: INTRAVENOUS | Status: DC

## 2022-06-07 MED ORDER — LORAZEPAM 2 MG/ML IJ SOLN: 0.0000 mg | Freq: Two times a day (BID) | INTRAMUSCULAR | Status: DC

## 2022-06-07 NOTE — ED Notes (Signed)
Patient had witnessed seizure that lasted approximately 30-40 secs. Daughter notified this RN that patient has drank 1 bottle of wine every day for multiple years and last drink was Saturday (since patient has been hospitalized). MD Horton made aware of this situation and at bedside to assess patient. Seizure interventions include: NRB placed at 10L, turned into side-lying position, and '2mg'$  Ativan administered.

## 2022-06-07 NOTE — Evaluation (Signed)
Physical Therapy Evaluation Patient Details Name: Brittany Washington MRN: [id number] DOB: 22-Jun-1951 Today's Date: 06/07/2022  History of Present Illness  Pt is a 71 y/o female admitted secondary to headache and confusion. MRI negative. Was about to be discharged and RN noted seizure like activity, so pt admitted. Recent admission for GI bleed. PMH includes alcohol abuse and HTN.  Clinical Impression  Pt admitted secondary to problem above with deficits below. Pt requiring min guard A for mobility tasks this session. Complaining of R hip pain, but did not seem to limit mobility. Educated about use of DME to help with pain management. Anticipate pt will progress well and will not require follow up PT. Will continue to follow acutely.        Recommendations for follow up therapy are one component of a multi-disciplinary discharge planning process, led by the attending physician.  Recommendations may be updated based on patient status, additional functional criteria and insurance authorization.  Follow Up Recommendations No PT follow up      Assistance Recommended at Discharge Intermittent Supervision/Assistance  Patient can return home with the following  Assist for transportation;Assistance with cooking/housework;Help with stairs or ramp for entrance    Equipment Recommendations None recommended by PT  Recommendations for Other Services       Functional Status Assessment Patient has had a recent decline in their functional status and demonstrates the ability to make significant improvements in function in a reasonable and predictable amount of time.     Precautions / Restrictions Precautions Precautions: Fall Restrictions Weight Bearing Restrictions: No      Mobility  Bed Mobility Overal bed mobility: Needs Assistance Bed Mobility: Supine to Sit, Sit to Supine     Supine to sit: Min assist Sit to supine: Min assist   General bed mobility comments: Assist for trunk elevation  to come to sitting. Assist with RLE to return to supine.    Transfers Overall transfer level: Needs assistance Equipment used: 1 person hand held assist Transfers: Sit to/from Stand Sit to Stand: Min guard           General transfer comment: Min guard for safety to stand from stretcher    Ambulation/Gait Ambulation/Gait assistance: Min guard Gait Distance (Feet): 100 Feet Assistive device: 1 person hand held assist Gait Pattern/deviations: Step-through pattern, Decreased stride length, Decreased weight shift to right Gait velocity: Decreased     General Gait Details: Slow, mildly antalgic gait. Reporting R hip pain throughout. Educated about using cane vs RW to offweight RLE if pain continued.  Stairs            Wheelchair Mobility    Modified Rankin (Stroke Patients Only)       Balance Overall balance assessment: Needs assistance Sitting-balance support: No upper extremity supported, Feet supported Sitting balance-Leahy Scale: Fair     Standing balance support: No upper extremity supported, Single extremity supported Standing balance-Leahy Scale: Fair                               Pertinent Vitals/Pain Pain Assessment Pain Assessment: Faces Faces Pain Scale: Hurts little more Pain Location: R hip Pain Descriptors / Indicators: Guarding, Grimacing Pain Intervention(s): Limited activity within patient's tolerance, Monitored during session, Repositioned    Home Living Family/patient expects to be discharged to:: Private residence Living Arrangements: Spouse/significant other Available Help at Discharge: Family Type of Home: House Home Access: Stairs to enter Entrance Stairs-Rails: Museum/gallery conservator  Stairs-Number of Steps: 10   Home Layout: Two level;Able to live on main level with bedroom/bathroom Home Equipment: Rolling Walker (2 wheels);Cane - single point;Shower seat      Prior Function Prior Level of Function :  Independent/Modified Independent                     Hand Dominance        Extremity/Trunk Assessment   Upper Extremity Assessment Upper Extremity Assessment: Defer to OT evaluation    Lower Extremity Assessment Lower Extremity Assessment: RLE deficits/detail RLE Deficits / Details: Reports R hip pain from laying on stretcher. Hx of R THA    Cervical / Trunk Assessment Cervical / Trunk Assessment: Normal  Communication   Communication: No difficulties  Cognition Arousal/Alertness: Awake/alert Behavior During Therapy: WFL for tasks assessed/performed Overall Cognitive Status: Within Functional Limits for tasks assessed                                          General Comments      Exercises     Assessment/Plan    PT Assessment Patient needs continued PT services  PT Problem List Decreased strength;Decreased range of motion;Decreased activity tolerance;Decreased balance;Decreased mobility;Pain       PT Treatment Interventions DME instruction;Gait training;Therapeutic activities;Functional mobility training;Stair training;Therapeutic exercise;Balance training;Patient/family education    PT Goals (Current goals can be found in the Care Plan section)  Acute Rehab PT Goals Patient Stated Goal: to go home PT Goal Formulation: With patient Time For Goal Achievement: 06/21/22 Potential to Achieve Goals: Good    Frequency Min 3X/week     Co-evaluation               AM-PAC PT "6 Clicks" Mobility  Outcome Measure Help needed turning from your back to your side while in a flat bed without using bedrails?: None Help needed moving from lying on your back to sitting on the side of a flat bed without using bedrails?: A Little Help needed moving to and from a bed to a chair (including a wheelchair)?: A Little Help needed standing up from a chair using your arms (e.g., wheelchair or bedside chair)?: A Little Help needed to walk in hospital  room?: A Little Help needed climbing 3-5 steps with a railing? : A Little 6 Click Score: 19    End of Session   Activity Tolerance: Patient tolerated treatment well Patient left: in bed;with call bell/phone within reach (on stretcher in ED) Nurse Communication: Mobility status PT Visit Diagnosis: Other abnormalities of gait and mobility (R26.89);Difficulty in walking, not elsewhere classified (R26.2);Pain Pain - Right/Left: Right Pain - part of body: Hip    Time: 6568-1275 PT Time Calculation (min) (ACUTE ONLY): 14 min   Charges:   PT Evaluation $PT Eval Low Complexity: 1 Low          Lou Miner, DPT  Acute Rehabilitation Services  Office: [phone]   Rudean Hitt 06/07/2022, 11:51 AM

## 2022-06-07 NOTE — ED Notes (Signed)
EEG to bedside

## 2022-06-07 NOTE — H&P (Signed)
History and Physical  Brittany Washington QHU:[id number] DOB: [birth year] DOA: 06/06/2022  Referring physician: Dr. Dina Rich, EDP  PCP: Charlane Ferretti, MD  Outpatient Specialists: GI Patient coming from: Home  Chief Complaint: Code stroke, seizure activity  HPI: Brittany Washington is a 71 y.o. female with medical history significant for recent admission for hematemesis secondary to gastritis seen on EGD, chronic anxiety, chronic alcohol abuse, hypertension, hyperlipidemia, hypothyroidism, who presented to [facility] ED from home due to strokelike symptoms.  Associated with headache, confusion and difficulty speaking.  Last known well 1930 on 06/06/22.  EMS was activated and code stroke was called in route.    In the ED, work-up was negative for CVA on MRI brain.  Seen by neurology/stroke team.  Neurological symptoms are thought to be secondary to complicated migraine.  While waiting to be discharged to home, the patient had a witnessed seizure by the nursing staff.  Per the patient's niece at bedtime, the patient drinks 1 bottle of wine daily.  No alcohol intake since her recent admission on 06/04/22.  Initially postictal, now more alert and conversant.  She received a dose of Keppra in the ED as recommended by neurology.  Plan for EEG in the morning.  ED Course: Tmax 98.4.  BP 141/87, pulse 118 respiration rate 27, O2 saturation 95% on room air.  Labs today remarkable for serum bicarb of 19, glucose 123, BUN 7, creatinine 0.99, AST 62, ALT 53.  Review of Systems: Review of systems as noted in the HPI. All other systems reviewed and are negative.   Past Medical History:  Diagnosis Date   Benign hypertensive heart disease without heart failure    GERD (gastroesophageal reflux disease)    Heart murmur, systolic    MECHNICAL   Hypertension    mild, lost weight, no meds now   Hypothyroidism    Osteoporosis    Primary osteoarthritis of right hip 06/18/2018   Past Surgical History:  Procedure  Laterality Date   APPENDECTOMY     BACK SURGERY     BIOPSY  03/10/2019   Procedure: BIOPSY;  Surgeon: Milus Banister, MD;  Location: [facility] ENDOSCOPY;  Service: Endoscopy;;   COLONOSCOPY     ESOPHAGOGASTRODUODENOSCOPY N/A 03/10/2019   Procedure: ESOPHAGOGASTRODUODENOSCOPY (EGD);  Surgeon: Milus Banister, MD;  Location: [facility] ENDOSCOPY;  Service: Endoscopy;  Laterality: N/A;   FRACTURE SURGERY     HAMMER TOE SURGERY Left 07/14/2013   Procedure: HAMMER TOE CORRECTION LEFT SECOND TOE, [place]USTIN/AKIN LEFT;  Surgeon: [name], DPM;  Location: [place];  Service: Podiatry;  Laterality: Left;   JOINT REPLACEMENT     LAPAROSCOPIC APPENDECTOMY N/A 12/29/2016   Procedure: APPENDECTOMY LAPAROSCOPIC;  Surgeon: Excell Seltzer, MD;  Location: [facility] ORS;  Service: General;  Laterality: N/A;   [place] DISKECTOMY W/ DISSECTION, LEFT L4 -- L5  02-23-2004   METATARSAL OSTEOTOMY Left 04/02/2015   Procedure: LEFT FOOT METATARSAL OSTEOTOMY;  Surgeon: [name], MD;  Location: [place];  Service: Orthopedics;  Laterality: Left;   METATARSAL OSTEOTOMY Left 08/18/2016   Procedure: METATARSAL OSTEOTOMY SECOND TOE;  Surgeon: [name], MD;  Location: [place];  Service: Orthopedics;  Laterality: Left;   ORIF LEFT DISTAL RADIAL FX W/ BONE GRAFT  01-14-2009   POSTERIOR LUMBAR FUSION  05-16-2005   L4 -- L5   RE-DO LUMBAR DISKECTOMY , L4  - L5  05-03-2004   REVERSE SHOULDER ARTHROPLASTY Right 05/23/2021  Procedure: REVERSE SHOULDER ARTHROPLASTY;  Surgeon: Hiram Gash, MD;  Location: [place];  Service: Orthopedics;  Laterality: Right;   TOTAL HIP ARTHROPLASTY Right 06/18/2018   TOTAL HIP ARTHROPLASTY Right 06/18/2018   Procedure: RIGHT TOTAL HIP ARTHROPLASTY ANTERIOR APPROACH;  Surgeon: [name], MD;  Location: [place];  Service: Orthopedics;  Laterality: Right;   TRIGGER FINGER RELEASE  01/09/2012    Procedure: RELEASE TRIGGER FINGER/A-1 PULLEY;  Surgeon: [name], MD;  Location: [place];  Service: Orthopedics;  Laterality: Left;   VAGINAL HYSTERECTOMY  1993   W/ BILATERAL SALPINGOOPHORECTOMY   WRIST FRACTURE SURGERY Left     Social History:  reports that she has never smoked. She has never used smokeless tobacco. She reports current alcohol use of about 7.0 standard drinks of alcohol per week. She reports that she does not use drugs.   Allergies  Allergen Reactions   Sulfa Antibiotics Diarrhea and Other (See Comments)    Body aches    Fosamax [Alendronate] Other (See Comments)    Heartburn, myalgias   Nsaids Other (See Comments)    History of GI bleeding    Family History  Problem Relation Age of Onset   Heart attack Father    Colon cancer Father    Heart attack Brother    Ovarian cancer Mother    Rectal cancer Neg Hx    Stomach cancer Neg Hx    Breast cancer Neg Hx       Prior to Admission medications   Medication Sig Start Date End Date Taking? Authorizing Provider  cetirizine (ZYRTEC) 10 MG tablet Take 10 mg by mouth at bedtime.     [provider]  Cholecalciferol (VITAMIN D3) 400 units CAPS Take 400 Units by mouth daily.    [provider]  clonazePAM (KLONOPIN) 1 MG tablet Take 1 tablet (1 mg total) by mouth at bedtime. Take 1-2 tablets by mouth daily at bedtime as needed for sleep. Patient taking differently: Take 2 mg by mouth at bedtime. 12/20/15   Nahser, Wonda Cheng, MD  cyanocobalamin 1000 MCG tablet Take 1 tablet (1,000 mcg total) by mouth daily. 06/06/22   Bonnielee Haff, MD  denosumab (PROLIA) 60 MG/ML SOSY injection Inject 60 mg into the skin every 6 (six) months.    [provider]  diphenhydrAMINE (BENADRYL) 25 MG tablet Take 25 mg by mouth at bedtime as needed for allergies.    [provider]  diphenoxylate-atropine (LOMOTIL) 2.5-0.025 MG tablet Take 1 tablet by mouth 4 (four) times daily as  needed for diarrhea or loose stools.    [provider]  hydrochlorothiazide (MICROZIDE) 12.5 MG capsule TAKE 1 CAPSULE BY MOUTH DAILY. Patient taking differently: Take 12.5 mg by mouth in the morning. 09/21/15   Darlin Coco, MD  levothyroxine (SYNTHROID) 125 MCG tablet Take 1 tablet (125 mcg total) by mouth daily. 06/06/22   Bonnielee Haff, MD  methocarbamol (ROBAXIN) 500 MG tablet Take 500 mg by mouth 2 (two) times daily as needed for muscle spasms.    [provider]  metoprolol tartrate (LOPRESSOR) 25 MG tablet Take 12.5 mg by mouth 2 (two) times daily.    [provider]  ondansetron (ZOFRAN-ODT) 4 MG disintegrating tablet Take 4 mg by mouth every 6 (six) hours as needed for nausea or vomiting. 03/30/21   [provider]  pantoprazole (PROTONIX) 40 MG tablet Take 1 tablet (40 mg total) by mouth 2 (two) times daily. 06/06/22  Bonnielee Haff, MD  thiamine (VITAMIN B-1) 100 MG tablet Take 1 tablet (100 mg total) by mouth daily. 06/06/22   Bonnielee Haff, MD  Turmeric 500 MG CAPS Take 500 mg by mouth daily with breakfast.    [provider]  TYLENOL 500 MG tablet Take 500-1,000 mg by mouth every 8 (eight) hours as needed (for mild pain).    [provider]  valsartan (DIOVAN) 160 MG tablet Take 160 mg by mouth daily. 09/26/18   [provider]    Physical Exam: BP (!) 141/87   Pulse (!) 119   Temp 98.3 F (36.8 C) (Oral)   Resp (!) 27   Wt 70.6 kg   SpO2 95%   BMI 26.72 kg/m   General: 71 y.o. year-old female well developed well nourished in no acute distress.  Alert and oriented x3. Cardiovascular: Regular rate and rhythm with no rubs or gallops.  No thyromegaly or JVD noted.  No lower extremity edema. 2/4 pulses in all 4 extremities. Respiratory: Clear to auscultation with no wheezes or rales. Good inspiratory effort. Abdomen: Soft nontender nondistended with normal bowel sounds x4 quadrants. Muskuloskeletal: No  cyanosis, clubbing or edema noted bilaterally Neuro: CN II-XII intact, strength, sensation, reflexes Skin: No ulcerative lesions noted or rashes Psychiatry: Judgement and insight appear normal. Mood is appropriate for condition and setting          Labs on Admission:  Basic Metabolic Panel: Recent Labs  Lab 06/04/22 1253 06/05/22 0402 06/05/22 0654 06/06/22 0344 06/06/22 2130  NA 132* 136  --  140 137  K 3.7 3.2*  --  4.3 3.8  CL 97* 106  --  111 105  CO2 19* 21*  --  24 19*  GLUCOSE 110* 99  --  109* 123*  BUN 19 11  --  7* 7*  CREATININE 1.11* 0.98  --  0.84 0.99  CALCIUM 9.3 8.1*  --  8.1* 9.1  MG  --   --  1.7  --   --    Liver Function Tests: Recent Labs  Lab 06/04/22 1253 06/05/22 0402 06/06/22 2130  AST 124* 70* 62*  ALT 94* 62* 53*  ALKPHOS 87 69 75  BILITOT 0.6 1.1 1.0  PROT 7.6 6.0* 6.5  ALBUMIN 4.5 3.6 4.0   No results for input(s): "LIPASE", "AMYLASE" in the last 168 hours. Recent Labs  Lab 06/06/22 2155  AMMONIA 26   CBC: Recent Labs  Lab 06/04/22 1253 06/05/22 0402 06/05/22 1637 06/06/22 0344 06/06/22 2130  WBC 5.9 4.5 4.3 4.1 5.4  NEUTROABS 4.0  --   --   --  3.1  HGB 13.7 11.7* 11.3* 11.6* 12.4  HCT 39.0 34.9* 34.6* 35.7* 37.1  MCV 103.7* 108.0* 110.2* 110.5* 110.4*  PLT 193 133* 131* 125* 125*   Cardiac Enzymes: No results for input(s): "CKTOTAL", "CKMB", "CKMBINDEX", "TROPONINI" in the last 168 hours.  BNP (last 3 results) No results for input(s): "BNP" in the last 8760 hours.  ProBNP (last 3 results) No results for input(s): "PROBNP" in the last 8760 hours.  CBG: Recent Labs  Lab 06/06/22 2116  GLUCAP 127*    Radiological Exams on Admission: MR BRAIN WO CONTRAST  Result Date: 06/07/2022 CLINICAL DATA:  Acute neurologic deficit EXAM: MRI HEAD WITHOUT CONTRAST TECHNIQUE: Multiplanar, multiecho pulse sequences of the brain and surrounding structures were obtained without intravenous contrast. COMPARISON:  06/25/2015.  FINDINGS: Brain: No acute infarct, mass effect or extra-axial collection. No acute or chronic hemorrhage.  There is multifocal hyperintense T2-weighted signal within the white matter. Generalized volume loss. The midline structures are normal. Vascular: Major flow voids are preserved. Skull and upper cervical spine: Normal calvarium and skull base. Visualized upper cervical spine and soft tissues are normal. Sinuses/Orbits:No paranasal sinus fluid levels or advanced mucosal thickening. No mastoid or middle ear effusion. Normal orbits. IMPRESSION: 1. No acute intracranial abnormality. 2. Findings of chronic small vessel ischemia and volume loss. Electronically Signed   By: Ulyses Jarred M.D.   On: 06/07/2022 02:15   CT ANGIO HEAD NECK W WO CM W PERF (CODE STROKE)  Result Date: 06/06/2022 CLINICAL DATA:  Aphasia and headache EXAM: CT ANGIOGRAPHY HEAD AND NECK CT PERFUSION BRAIN TECHNIQUE: Multidetector CT imaging of the head and neck was performed using the standard protocol during bolus administration of intravenous contrast. Multiplanar CT image reconstructions and MIPs were obtained to evaluate the vascular anatomy. Carotid stenosis measurements (when applicable) are obtained utilizing NASCET criteria, using the distal internal carotid diameter as the denominator. Multiphase CT imaging of the brain was performed following IV bolus contrast injection. Subsequent parametric perfusion maps were calculated using RAPID software. RADIATION DOSE REDUCTION: This exam was performed according to the departmental dose-optimization program which includes automated exposure control, adjustment of the mA and/or kV according to patient size and/or use of iterative reconstruction technique. CONTRAST:  138m OMNIPAQUE IOHEXOL 350 MG/ML SOLN COMPARISON:  None Available. FINDINGS: CTA NECK FINDINGS SKELETON: There is no bony spinal canal stenosis. No lytic or blastic lesion. OTHER NECK: Normal pharynx, larynx and major salivary  glands. No cervical lymphadenopathy. Unremarkable thyroid gland. UPPER CHEST: No pneumothorax or pleural effusion. No nodules or masses. AORTIC ARCH: There is no calcific atherosclerosis of the aortic arch. There is no aneurysm, dissection or hemodynamically significant stenosis of the visualized portion of the aorta. Conventional 3 vessel aortic branching pattern. The visualized proximal subclavian arteries are widely patent. RIGHT CAROTID SYSTEM: Normal without aneurysm, dissection or stenosis. LEFT CAROTID SYSTEM: Normal without aneurysm, dissection or stenosis. VERTEBRAL ARTERIES: Left dominant configuration. Both origins are clearly patent. There is no dissection, occlusion or flow-limiting stenosis to the skull base (V1-V3 segments). CTA HEAD FINDINGS POSTERIOR CIRCULATION: --Vertebral arteries: Normal V4 segments. --Inferior cerebellar arteries: Normal. --Basilar artery: Normal. --Superior cerebellar arteries: Normal. --Posterior cerebral arteries (PCA): Normal. Both are predominantly supplied by the posterior communicating arteries (p-comm). ANTERIOR CIRCULATION: --Intracranial internal carotid arteries: Normal. --Anterior cerebral arteries (ACA): Normal. Both A1 segments are present. Patent anterior communicating artery (a-comm). --Middle cerebral arteries (MCA): Normal. VENOUS SINUSES: As permitted by contrast timing, patent. ANATOMIC VARIANTS: None Review of the MIP images confirms the above findings. CT Brain Perfusion Findings: ASPECTS: 10 CBF (<30%) Volume: 068mPerfusion (Tmax>6.0s) volume: 51m32mismatch Volume: 51mL44mfarction Location:None IMPRESSION: 1. No emergent large vessel occlusion or high-grade stenosis of the intracranial arteries. 2. Normal CT perfusion. Electronically Signed   By: KeviUlyses Jarred.   On: 06/06/2022 22:04   CT HEAD CODE STROKE WO CONTRAST  Result Date: 06/06/2022 CLINICAL DATA:  Code stroke.  Aphasia and right-sided weakness EXAM: CT HEAD WITHOUT CONTRAST TECHNIQUE:  Contiguous axial images were obtained from the base of the skull through the vertex without intravenous contrast. RADIATION DOSE REDUCTION: This exam was performed according to the departmental dose-optimization program which includes automated exposure control, adjustment of the mA and/or kV according to patient size and/or use of iterative reconstruction technique. COMPARISON:  None Available. FINDINGS: Brain: There is no mass, hemorrhage or extra-axial collection. The size  and configuration of the ventricles and extra-axial CSF spaces are normal. The brain parenchyma is normal, without evidence of acute or chronic infarction. Vascular: No abnormal hyperdensity of the major intracranial arteries or dural venous sinuses. No intracranial atherosclerosis. Skull: The visualized skull base, calvarium and extracranial soft tissues are normal. Sinuses/Orbits: No fluid levels or advanced mucosal thickening of the visualized paranasal sinuses. No mastoid or middle ear effusion. The orbits are normal. ASPECTS [facility] Stroke Program Early CT Score) - Ganglionic level infarction (caudate, lentiform nuclei, internal capsule, insula, M1-M3 cortex): 7 - Supraganglionic infarction (M4-M6 cortex): 3 Total score (0-10 with 10 being normal): 10 IMPRESSION: 1. No acute intracranial abnormality. 2. ASPECTS is 10 3. These results were communicated to Dr. Roland Rack at 9:28 pm on 06/06/2022 by text page via the [facility] messaging system. Electronically Signed   By: Ulyses Jarred M.D.   On: 06/06/2022 21:28    EKG: I independently viewed the EKG done and my findings are as followed: Sinus tachycardia rate of 129.  Nonspecific ST-T changes.  QTc 488.  Assessment/Plan Present on Admission: **None**  Principal Problem:   Seizure ([place])  Seizure activity suspect secondary to alcohol withdrawal Received a dose of Keppra in the ED as recommended by neurology/stroke team Seizure precaution IV Ativan as needed for breakthrough  seizures EEG in the morning Neurology following.  Chronic alcohol abuse with concern for alcohol withdrawal No alcohol intake for the past 3 days, in the window of alcohol withdrawal. Would like to maintain her sobriety, TOC consulted to assist with providing resources. Multivitamins, thiamine, folic acid supplements.  Acute metabolic encephalopathy/expressive aphasia No evidence of acute CVA on MRI brain PT OT assessment in the morning  Non-anion gap metabolic acidosis Serum bicarb 19, anion gap of 13 Gentle IV fluid hydration NS at 50 cc/h times Resume chemistry panel in the morning  Recent history of hematemesis with gastritis seen on EGD Resume home regimen Restart home PPI p.o. Protonix 40 mg twice daily.  Hypothyroidism Resume home levothyroxine   Critical care time: 65 minutes.    DVT prophylaxis: Lovenox subcu daily  Code Status: Full code  Family Communication: Updated the patient's niece at bedside with patient's own permission.  Disposition Plan: Admitted to progressive care unit   Consults called: None.  Admission status: Inpatient status.   Status is: Inpatient The patient will require least 2 midnights for further evaluation and treatment of present condition.   Kayleen Memos MD [facility] Pager [phone]  If 7PM-7AM, please contact night-coverage [email or web address] Password [email or web address]  06/07/2022, 4:08 AM

## 2022-06-07 NOTE — Progress Notes (Signed)
EEG complete - results pending 

## 2022-06-07 NOTE — Discharge Instructions (Addendum)
SEIZURE PRECAUTIONS °Per [place] DMV statutes, patients with seizures are not allowed to drive until they have been seizure-free for six months. °  °Use caution when using heavy equipment or power tools. Avoid working on ladders or at heights. Take showers instead of baths. Ensure the water temperature is not too high on the home water heater. Do not go swimming alone. Do not lock yourself in a room alone (i.e. bathroom). When caring for infants or small children, sit down when holding, feeding, or changing them to minimize risk of injury to the child in the event you have a seizure. Maintain good sleep hygiene. Avoid alcohol. °  °If patient has another seizure, call 911 and bring them back to the ED if: °A.  The seizure lasts longer than 5 minutes.      °B.  The patient doesn't wake shortly after the seizure or has new problems such as difficulty seeing, speaking or moving following the seizure °C.  The patient was injured during the seizure °D.  The patient has a temperature over 102 F (39C) °E.  The patient vomited during the seizure and now is having trouble breathing ° ° °

## 2022-06-07 NOTE — Progress Notes (Addendum)
PROGRESS NOTE    [name]  IOE:[id number] DOB: August 03, 1951 DOA: 06/06/2022 PCP: Charlane Ferretti, MD   Chief Complaint  Patient presents with   Code Stroke    Brief Narrative:   This is a note charge note as patient was seen and admitted earlier today by Dr. Charleen Kirks  chart, imaging and labs were reviewed, patient was seen and examined.    Brittany Washington is a 71 y.o. female with medical history significant for recent admission for hematemesis secondary to gastritis seen on EGD, chronic anxiety, chronic alcohol abuse, hypertension, hyperlipidemia, hypothyroidism, who presented to [facility] ED from home due to strokelike symptoms.  Associated with headache, confusion and difficulty speaking.  Last known well 1930 on 06/06/22.  EMS was activated and code stroke was called in route.     In the ED, work-up was negative for CVA on MRI brain.  Seen by neurology/stroke team.  Neurological symptoms are thought to be secondary to complicated migraine.   While waiting to be discharged to home, the patient had a witnessed seizure by the nursing staff.  Per the patient's niece at bedtime, the patient drinks 1 bottle of wine daily.  No alcohol intake since her recent admission on 06/04/22.  Initially postictal, now more alert and conversant.  She received a dose of Keppra in the ED as recommended by neurology.  Plan for EEG in the morning.   ED Course: Tmax 98.4.  BP 141/87, pulse 118 respiration rate 27, O2 saturation 95% on room air.  Labs today remarkable for serum bicarb of 19, glucose 123, BUN 7, creatinine 0.99, AST 62, ALT 53.    Assessment & Plan:   Principal Problem:   Seizure ([place])   Seizure activity suspect secondary to alcohol withdrawal Received a dose of Keppra in the ED as recommended by Seizure precaution IV Ativan as needed for breakthrough seizures EEG this morning with no evidence of seizure activities    Chronic alcohol abuse with concern for alcohol withdrawal No  alcohol intake for the past 3 days, in the window of alcohol withdrawal. Would like to maintain her sobriety, TOC consulted to assist with providing resources. Multivitamins, thiamine, folic acid supplements. Patient will be started on Librium scheduled dosing to prevent further seizures or withdrawals   Acute metabolic encephalopathy/expressive aphasia No evidence of acute CVA on MRI brain PT OT assessment in the morning This may be related to complex migraine versus post post ictal status  B12 deficiency  During recent hospitalization, continue with supplemen  Macrocytic anemia -With recent diagnosis of upper GI bleed during recent hospitalization she was discharged yesterday, continue with PPI twice daily   Non-anion gap metabolic acidosis Serum bicarb 19, anion gap of 13 Gentle IV fluid hydration NS at 50 cc/h times Resume chemistry panel in the morning   Recent history of hematemesis with gastritis seen on EGD Resume home regimen Restart home PPI p.o. Protonix 40 mg twice daily.   Hypothyroidism Resume home levothyroxine      DVT prophylaxis: Lovenox Code Status: Full Family Communication: None at bedside Disposition:   Status is: Inpatient  Consultants:  Neurology   Subjective:  And had a seizure event yesterday, she denies any focal deficits this morning  Objective: Vitals:   06/07/22 1105 06/07/22 1230 06/07/22 1300 06/07/22 1337  BP: 127/73 130/71    Pulse: 94 81 84   Resp: '18 20 16   '$ Temp:    98.6 F (37 C)  TempSrc:  SpO2: 94% 97% 97%   Weight:       No intake or output data in the 24 hours ending 2022/06/09 1339 Filed Weights   06/06/22 2119  Weight: 70.6 kg    Examination:  Awake Alert, Oriented X 3, No new F.N deficits, Normal affect Symmetrical Chest wall movement, Good air movement bilaterally, CTAB RRR,No Gallops,Rubs or new Murmurs, No Parasternal Heave +ve B.Sounds, Abd Soft, No tenderness, No rebound - guarding or rigidity. No  Cyanosis, Clubbing or edema, No new Rash or bruise     Data Reviewed: I have personally reviewed following labs and imaging studies  CBC: Recent Labs  Lab 06/04/22 1253 06/05/22 0402 06/05/22 1637 06/06/22 0344 06/06/22 2130 06-09-22 0520  WBC 5.9 4.5 4.3 4.1 5.4 6.2  NEUTROABS 4.0  --   --   --  3.1  --   HGB 13.7 11.7* 11.3* 11.6* 12.4 11.4*  HCT 39.0 34.9* 34.6* 35.7* 37.1 33.7*  MCV 103.7* 108.0* 110.2* 110.5* 110.4* 107.0*  PLT 193 133* 131* 125* 125* 141*    Basic Metabolic Panel: Recent Labs  Lab 06/04/22 1253 06/05/22 0402 06/05/22 0654 06/06/22 0344 06/06/22 2130 06-09-2022 0520  NA 132* 136  --  140 137 134*  K 3.7 3.2*  --  4.3 3.8 3.4*  CL 97* 106  --  111 105 104  CO2 19* 21*  --  24 19* 20*  GLUCOSE 110* 99  --  109* 123* 119*  BUN 19 11  --  7* 7* 5*  CREATININE 1.11* 0.98  --  0.84 0.99 0.99  CALCIUM 9.3 8.1*  --  8.1* 9.1 8.5*  MG  --   --  1.7  --   --  2.2  PHOS  --   --   --   --   --  1.9*    GFR: Estimated Creatinine Clearance: 51.7 mL/min (by C-G formula based on SCr of 0.99 mg/dL).  Liver Function Tests: Recent Labs  Lab 06/04/22 1253 06/05/22 0402 06/06/22 2130 06/09/2022 0520  AST 124* 70* 62* 60*  ALT 94* 62* 53* 53*  ALKPHOS 87 69 75 68  BILITOT 0.6 1.1 1.0 0.5  PROT 7.6 6.0* 6.5 6.2*  ALBUMIN 4.5 3.6 4.0 3.6    CBG: Recent Labs  Lab 06/06/22 2116  GLUCAP 127*     No results found for this or any previous visit (from the past 240 hour(s)).       Radiology Studies: EEG adult  Result Date: 09-Jun-2022 Lora Havens, MD     06/09/22  8:37 AM Patient Name: Brittany Washington MRN: [id number] Epilepsy Attending: Lora Havens Referring Physician/Provider: Greta Doom, MD Date: 06-09-22 Duration: 25.33 mins Patient history: 71 year old female with relatively abrupt onset difficulty speaking. EEG to evaluate for seizure Level of alertness: Awake, asleep AEDs during EEG study: Ativan Technical aspects: This  EEG study was done with scalp electrodes positioned according to the 10-20 International system of electrode placement. Electrical activity was reviewed with band pass filter of 1-'70Hz'$ , sensitivity of 7 uV/mm, display speed of 40m/sec with a '60Hz'$  notched filter applied as appropriate. EEG data were recorded continuously and digitally stored.  Video monitoring was available and reviewed as appropriate. Description: The posterior dominant rhythm consists of 9-10 Hz activity of moderate voltage (25-35 uV) seen predominantly in posterior head regions, symmetric and reactive to eye opening and eye closing. Sleep was characterized by vertex waves, sleep spindles (12 to 14 Hz), maximal frontocentral region.  Hyperventilation and photic stimulation were not performed.   IMPRESSION: This study is within normal limits. No seizures or epileptiform discharges were seen throughout the recording. A normal interictal EEG does not exclude the diagnosis of epilepsy. Lora Havens   MR BRAIN WO CONTRAST  Result Date: 06/07/2022 CLINICAL DATA:  Acute neurologic deficit EXAM: MRI HEAD WITHOUT CONTRAST TECHNIQUE: Multiplanar, multiecho pulse sequences of the brain and surrounding structures were obtained without intravenous contrast. COMPARISON:  06/25/2015. FINDINGS: Brain: No acute infarct, mass effect or extra-axial collection. No acute or chronic hemorrhage. There is multifocal hyperintense T2-weighted signal within the white matter. Generalized volume loss. The midline structures are normal. Vascular: Major flow voids are preserved. Skull and upper cervical spine: Normal calvarium and skull base. Visualized upper cervical spine and soft tissues are normal. Sinuses/Orbits:No paranasal sinus fluid levels or advanced mucosal thickening. No mastoid or middle ear effusion. Normal orbits. IMPRESSION: 1. No acute intracranial abnormality. 2. Findings of chronic small vessel ischemia and volume loss. Electronically Signed   By:  Ulyses Jarred M.D.   On: 06/07/2022 02:15   CT ANGIO HEAD NECK W WO CM W PERF (CODE STROKE)  Result Date: 06/06/2022 CLINICAL DATA:  Aphasia and headache EXAM: CT ANGIOGRAPHY HEAD AND NECK CT PERFUSION BRAIN TECHNIQUE: Multidetector CT imaging of the head and neck was performed using the standard protocol during bolus administration of intravenous contrast. Multiplanar CT image reconstructions and MIPs were obtained to evaluate the vascular anatomy. Carotid stenosis measurements (when applicable) are obtained utilizing NASCET criteria, using the distal internal carotid diameter as the denominator. Multiphase CT imaging of the brain was performed following IV bolus contrast injection. Subsequent parametric perfusion maps were calculated using RAPID software. RADIATION DOSE REDUCTION: This exam was performed according to the departmental dose-optimization program which includes automated exposure control, adjustment of the mA and/or kV according to patient size and/or use of iterative reconstruction technique. CONTRAST:  144m OMNIPAQUE IOHEXOL 350 MG/ML SOLN COMPARISON:  None Available. FINDINGS: CTA NECK FINDINGS SKELETON: There is no bony spinal canal stenosis. No lytic or blastic lesion. OTHER NECK: Normal pharynx, larynx and major salivary glands. No cervical lymphadenopathy. Unremarkable thyroid gland. UPPER CHEST: No pneumothorax or pleural effusion. No nodules or masses. AORTIC ARCH: There is no calcific atherosclerosis of the aortic arch. There is no aneurysm, dissection or hemodynamically significant stenosis of the visualized portion of the aorta. Conventional 3 vessel aortic branching pattern. The visualized proximal subclavian arteries are widely patent. RIGHT CAROTID SYSTEM: Normal without aneurysm, dissection or stenosis. LEFT CAROTID SYSTEM: Normal without aneurysm, dissection or stenosis. VERTEBRAL ARTERIES: Left dominant configuration. Both origins are clearly patent. There is no dissection,  occlusion or flow-limiting stenosis to the skull base (V1-V3 segments). CTA HEAD FINDINGS POSTERIOR CIRCULATION: --Vertebral arteries: Normal V4 segments. --Inferior cerebellar arteries: Normal. --Basilar artery: Normal. --Superior cerebellar arteries: Normal. --Posterior cerebral arteries (PCA): Normal. Both are predominantly supplied by the posterior communicating arteries (p-comm). ANTERIOR CIRCULATION: --Intracranial internal carotid arteries: Normal. --Anterior cerebral arteries (ACA): Normal. Both A1 segments are present. Patent anterior communicating artery (a-comm). --Middle cerebral arteries (MCA): Normal. VENOUS SINUSES: As permitted by contrast timing, patent. ANATOMIC VARIANTS: None Review of the MIP images confirms the above findings. CT Brain Perfusion Findings: ASPECTS: 10 CBF (<30%) Volume: 074mPerfusion (Tmax>6.0s) volume: 15m615mismatch Volume: 15mL62mfarction Location:None IMPRESSION: 1. No emergent large vessel occlusion or high-grade stenosis of the intracranial arteries. 2. Normal CT perfusion. Electronically Signed   By: KeviUlyses Jarred.   On: 06/06/2022 22:04  CT HEAD CODE STROKE WO CONTRAST  Result Date: 06/06/2022 CLINICAL DATA:  Code stroke.  Aphasia and right-sided weakness EXAM: CT HEAD WITHOUT CONTRAST TECHNIQUE: Contiguous axial images were obtained from the base of the skull through the vertex without intravenous contrast. RADIATION DOSE REDUCTION: This exam was performed according to the departmental dose-optimization program which includes automated exposure control, adjustment of the mA and/or kV according to patient size and/or use of iterative reconstruction technique. COMPARISON:  None Available. FINDINGS: Brain: There is no mass, hemorrhage or extra-axial collection. The size and configuration of the ventricles and extra-axial CSF spaces are normal. The brain parenchyma is normal, without evidence of acute or chronic infarction. Vascular: No abnormal hyperdensity of the  major intracranial arteries or dural venous sinuses. No intracranial atherosclerosis. Skull: The visualized skull base, calvarium and extracranial soft tissues are normal. Sinuses/Orbits: No fluid levels or advanced mucosal thickening of the visualized paranasal sinuses. No mastoid or middle ear effusion. The orbits are normal. ASPECTS [facility] Stroke Program Early CT Score) - Ganglionic level infarction (caudate, lentiform nuclei, internal capsule, insula, M1-M3 cortex): 7 - Supraganglionic infarction (M4-M6 cortex): 3 Total score (0-10 with 10 being normal): 10 IMPRESSION: 1. No acute intracranial abnormality. 2. ASPECTS is 10 3. These results were communicated to Dr. Roland Rack at 9:28 pm on 06/06/2022 by text page via the [facility] messaging system. Electronically Signed   By: Ulyses Jarred M.D.   On: 06/06/2022 21:28        Scheduled Meds:  chlordiazePOXIDE  10 mg Oral QID   cholecalciferol  400 Units Oral Daily   cyanocobalamin  1,000 mcg Oral Daily   enoxaparin (LOVENOX) injection  40 mg Subcutaneous Q24H   levothyroxine  125 mcg Oral Q0600   loratadine  10 mg Oral Daily   LORazepam  0-4 mg Intravenous Q6H   Or   LORazepam  0-4 mg Oral Q6H   [START ON 06/09/2022] LORazepam  0-4 mg Intravenous Q12H   Or   [START ON 06/09/2022] LORazepam  0-4 mg Oral Q12H   pantoprazole  40 mg Oral BID   sodium chloride flush  3 mL Intravenous Once   thiamine  100 mg Oral Daily   Or   thiamine  100 mg Intravenous Daily   Continuous Infusions:  sodium chloride 50 mL/hr at 06/07/22 0411   levETIRAcetam Stopped (06/07/22 0339)     LOS: 0 days     Phillips Climes, MD [facility]   To contact the attending provider between 7A-7P or the covering provider during after hours 7P-7A, please log into the web site [email or web address] and access using universal [place] password for that web site. If you do not have the password, please call the hospital operator.  06/07/2022, 1:39 PM

## 2022-06-07 NOTE — Progress Notes (Signed)
Neurology Progress Note   S:// Patient seen and examined. No acute changes.   O:// Current vital signs: BP 120/75   Pulse 90   Temp 98.6 F (37 C)   Resp (!) 25   Wt 70.6 kg   SpO2 98%   BMI 26.72 kg/m  Vital signs in last 24 hours: Temp:  [97.9 F (36.6 C)-98.6 F (37 C)] 98.6 F (37 C) (10/04 1337) Pulse Rate:  [75-160] 90 (10/04 1606) Resp:  [10-35] 25 (10/04 1530) BP: (95-163)/(66-87) 120/75 (10/04 1606) SpO2:  [3 %-100 %] 98 % (10/04 1530) Weight:  [70.6 kg] 70.6 kg (10/03 2119)  Exam Awake alert oriented x3 No dysarthria No aphasia Cranial nerves II to XII intact Mild giveaway weakness in right lower extremity but otherwise no motor deficits No sensory deficits   Medications  Current Facility-Administered Medications:    0.9 %  sodium chloride infusion, , Intravenous, Continuous, Kayleen Memos, DO, Last Rate: 50 mL/hr at 06/07/22 0411, New Bag at 06/07/22 0411   chlordiazePOXIDE (LIBRIUM) capsule 10 mg, 10 mg, Oral, QID, Elgergawy, Silver Huguenin, MD, 10 mg at 06/07/22 1229   cholecalciferol (VITAMIN D3) 10 MCG (400 UNIT) tablet 400 Units, 400 Units, Oral, Daily, Irene Pap N, DO, 400 Units at 06/07/22 1100   clonazepam (KLONOPIN) disintegrating tablet 0.25 mg, 0.25 mg, Oral, BID PRN, [place] Crane, Carole N, DO   cyanocobalamin (VITAMIN B12) tablet 1,000 mcg, 1,000 mcg, Oral, Daily, Hall, Carole N, DO, 1,000 mcg at 06/07/22 1100   enoxaparin (LOVENOX) injection 40 mg, 40 mg, Subcutaneous, Q24H, Hall, Carole N, DO, 40 mg at 06/07/22 1609   hydrOXYzine (ATARAX) tablet 10 mg, 10 mg, Oral, TID PRN, [place] Crane, Carole N, DO   levETIRAcetam (KEPPRA) IVPB 1000 mg/100 mL premix, 1,000 mg, Intravenous, Q12H, Horton, Barbette Hair, MD, Stopped at 06/07/22 1629   levothyroxine (SYNTHROID) tablet 125 mcg, 125 mcg, Oral, Q0600, Hall, Carole N, DO   loratadine (CLARITIN) tablet 10 mg, 10 mg, Oral, Daily, Hall, Carole N, DO, 10 mg at 06/07/22 1100   LORazepam (ATIVAN) injection 0-4 mg, 0-4 mg,  Intravenous, Q6H, 1 mg at 06/07/22 0703 **OR** LORazepam (ATIVAN) tablet 0-4 mg, 0-4 mg, Oral, Q6H, Horton, Barbette Hair, MD   [START ON 06/09/2022] LORazepam (ATIVAN) injection 0-4 mg, 0-4 mg, Intravenous, Q12H **OR** [START ON 06/09/2022] LORazepam (ATIVAN) tablet 0-4 mg, 0-4 mg, Oral, Q12H, Horton, Barbette Hair, MD   LORazepam (ATIVAN) injection 2 mg, 2 mg, Intravenous, Q6H PRN, Hall, Carole N, DO   pantoprazole (PROTONIX) EC tablet 40 mg, 40 mg, Oral, BID, Hall, Carole N, DO, 40 mg at 06/07/22 1100   sodium chloride flush (NS) 0.9 % injection 3 mL, 3 mL, Intravenous, Once, Davonna Belling, MD   thiamine (VITAMIN B1) tablet 100 mg, 100 mg, Oral, Daily, 100 mg at 06/07/22 1100 **OR** thiamine (VITAMIN B1) injection 100 mg, 100 mg, Intravenous, Daily, Horton, Barbette Hair, MD  Current Outpatient Medications:    cetirizine (ZYRTEC) 10 MG tablet, Take 10 mg by mouth at bedtime. , Disp: , Rfl:    Cholecalciferol (VITAMIN D3) 400 units CAPS, Take 400 Units by mouth daily., Disp: , Rfl:    clonazePAM (KLONOPIN) 1 MG tablet, Take 1 tablet (1 mg total) by mouth at bedtime. Take 1-2 tablets by mouth daily at bedtime as needed for sleep. (Patient taking differently: Take 2 mg by mouth at bedtime.), Disp: 15 tablet, Rfl: 0   cyanocobalamin 1000 MCG tablet, Take 1 tablet (1,000 mcg total) by mouth daily., Disp:  30 tablet, Rfl: 3   denosumab (PROLIA) 60 MG/ML SOSY injection, Inject 60 mg into the skin every 6 (six) months., Disp: , Rfl:    diphenhydrAMINE (BENADRYL) 25 MG tablet, Take 25 mg by mouth at bedtime as needed for allergies., Disp: , Rfl:    diphenoxylate-atropine (LOMOTIL) 2.5-0.025 MG tablet, Take 1 tablet by mouth 4 (four) times daily as needed for diarrhea or loose stools., Disp: , Rfl:    hydrochlorothiazide (MICROZIDE) 12.5 MG capsule, TAKE 1 CAPSULE BY MOUTH DAILY. (Patient taking differently: Take 12.5 mg by mouth in the morning.), Disp: 30 capsule, Rfl: 9   levothyroxine (SYNTHROID) 125 MCG tablet,  Take 1 tablet (125 mcg total) by mouth daily., Disp: 30 tablet, Rfl: 2   methocarbamol (ROBAXIN) 500 MG tablet, Take 500 mg by mouth 2 (two) times daily as needed for muscle spasms., Disp: , Rfl:    metoprolol tartrate (LOPRESSOR) 25 MG tablet, Take 12.5 mg by mouth 2 (two) times daily., Disp: , Rfl:    ondansetron (ZOFRAN-ODT) 4 MG disintegrating tablet, Take 4 mg by mouth every 6 (six) hours as needed for nausea or vomiting., Disp: , Rfl:    pantoprazole (PROTONIX) 40 MG tablet, Take 1 tablet (40 mg total) by mouth 2 (two) times daily., Disp: 60 tablet, Rfl: 2   thiamine (VITAMIN B-1) 100 MG tablet, Take 1 tablet (100 mg total) by mouth daily., Disp: 30 tablet, Rfl: 1   Turmeric 500 MG CAPS, Take 500 mg by mouth daily with breakfast., Disp: , Rfl:    TYLENOL 500 MG tablet, Take 500-1,000 mg by mouth every 8 (eight) hours as needed (for mild pain)., Disp: , Rfl:    valsartan (DIOVAN) 160 MG tablet, Take 160 mg by mouth daily., Disp: , Rfl:  Labs CBC    Component Value Date/Time   WBC 6.2 06/07/2022 0520   RBC 3.15 (L) 06/07/2022 0520   HGB 11.4 (L) 06/07/2022 0520   HCT 33.7 (L) 06/07/2022 0520   PLT 141 (L) 06/07/2022 0520   MCV 107.0 (H) 06/07/2022 0520   MCH 36.2 (H) 06/07/2022 0520   MCHC 33.8 06/07/2022 0520   RDW 12.5 06/07/2022 0520   LYMPHSABS 1.7 06/06/2022 2130   MONOABS 0.5 06/06/2022 2130   EOSABS 0.0 06/06/2022 2130   BASOSABS 0.0 06/06/2022 2130    CMP     Component Value Date/Time   NA 134 (L) 06/07/2022 0520   K 3.4 (L) 06/07/2022 0520   CL 104 06/07/2022 0520   CO2 20 (L) 06/07/2022 0520   GLUCOSE 119 (H) 06/07/2022 0520   BUN 5 (L) 06/07/2022 0520   CREATININE 0.99 06/07/2022 0520   CREATININE 1.00 (H) 01/24/2016 0937   CALCIUM 8.5 (L) 06/07/2022 0520   PROT 6.2 (L) 06/07/2022 0520   ALBUMIN 3.6 06/07/2022 0520   AST 60 (H) 06/07/2022 0520   ALT 53 (H) 06/07/2022 0520   ALKPHOS 68 06/07/2022 0520   BILITOT 0.5 06/07/2022 0520   GFRNONAA >60 06/07/2022  0520   GFRAA 54 (L) 03/10/2019 0453    Imaging I have reviewed images in epic and the results pertinent to this consultation are: MR brain negative for acute stroke  EEG normal  Assessment: 71 year old with relatively abrupt onset of difficulty speaking brought in as a code stroke, evaluated by overnight neurologist with exam unusual and concern for possible stress related symptoms/nonorganic etiology.  In the past has had complicated migraine as an etiology listed for various neurological symptoms. As she was getting ready  to be discharged, had a witnessed seizure. MRI negative EEG normal Also reported that she stopped drinking a few days ago-could be a withdrawal seizure.  Was loaded with Keppra but I would not recommend continuing AED therapy at this time.  Impression: Altered mental status due to hypothyroidism versus conversion New onset seizure-like activity-again unclear if true seizure activity versus conversion   Recommendations: No further work-up from a neurological standpoint She will still need to maintain seizure precautions including no driving for 6 months per state law-detailed seizure precautions below. Plan was relayed to Dr. Waldron [facility] Neurology will be available as needed    [place] Per [facility] statutes, patients with seizures are not allowed to drive until they have been seizure-free for six months.   Use caution when using heavy equipment or power tools. Avoid working on ladders or at heights. Take showers instead of baths. Ensure the water temperature is not too high on the home water heater. Do not go swimming alone. Do not lock yourself in a room alone (i.e. bathroom). When caring for infants or small children, sit down when holding, feeding, or changing them to minimize risk of injury to the child in the event you have a seizure. Maintain good sleep hygiene. Avoid alcohol.    If patient has another seizure, call 911 and bring them  back to the ED if: A.  The seizure lasts longer than 5 minutes.      B.  The patient doesn't wake shortly after the seizure or has new problems such as difficulty seeing, speaking or moving following the seizure C.  The patient was injured during the seizure D.  The patient has a temperature over 102 F (39C) E.  The patient vomited during the seizure and now is having trouble breathing    -- Amie Portland, MD Neurologist [facility] Pager: [phone]

## 2022-06-07 NOTE — ED Notes (Signed)
ED TO INPATIENT HANDOFF REPORT  ED Nurse Name and Phone #: [phone]  S Name/Age/Gender Brittany Washington 71 y.o. female Room/Bed: 023C/023C  Code Status   Code Status: Full Code  Home/SNF/Other Home Patient oriented to: self, place, time, and situation Is this baseline? Yes   Triage Complete: Triage complete  Chief Complaint Seizure [facility]) [R56.9]  Triage Note Patient arrives to ED BIB [facility] as a Code Stroke. Per EMS patient was discharged today after being seen for GI Bleeding and LKW was 1930. Per EMS patients husbands states patient was complaining of a headache, confusion and difficulty speaking. EDP and Neurologist present upon patients arrival.  BP 177/91 HR 82 CBG 116   Allergies Allergies  Allergen Reactions   Sulfa Antibiotics Diarrhea and Other (See Comments)    Body aches    Fosamax [Alendronate] Other (See Comments)    Heartburn, myalgias   Nsaids Other (See Comments)    History of GI bleeding    Level of Care/Admitting Diagnosis ED Disposition     ED Disposition  Admit   Condition  --   [place] [facility] [place] [facility]: [place] [[id number]]  Level of Care: Progressive [102]  Admit to Progressive based on following criteria: MULTISYSTEM THREATS such as stable sepsis, metabolic/electrolyte imbalance with or without encephalopathy that is responding to early treatment.  May admit patient to [facility] or [facility] if equivalent level of care is available:: Yes  Covid Evaluation: Asymptomatic - no recent exposure (last 10 days) testing not required  Diagnosis: Seizure [facility]) [[id number]]  Admitting Physician: Kayleen Memos [[id number]]  Attending Physician: Kayleen Memos [[id number]]  Certification:: I certify this patient will need inpatient services for at least 2 midnights  Estimated Length of Stay: 2          B Medical/Surgery History Past Medical History:  Diagnosis Date   Benign hypertensive heart disease without heart failure    GERD  (gastroesophageal reflux disease)    Heart murmur, systolic    MECHNICAL   Hypertension    mild, lost weight, no meds now   Hypothyroidism    Osteoporosis    Primary osteoarthritis of right hip 06/18/2018   Past Surgical History:  Procedure Laterality Date   APPENDECTOMY     BACK SURGERY     BIOPSY  03/10/2019   Procedure: BIOPSY;  Surgeon: Milus Banister, MD;  Location: [facility] ENDOSCOPY;  Service: Endoscopy;;   COLONOSCOPY     ESOPHAGOGASTRODUODENOSCOPY N/A 03/10/2019   Procedure: ESOPHAGOGASTRODUODENOSCOPY (EGD);  Surgeon: Milus Banister, MD;  Location: [facility] ENDOSCOPY;  Service: Endoscopy;  Laterality: N/A;   FRACTURE SURGERY     HAMMER TOE SURGERY Left 07/14/2013   Procedure: HAMMER TOE CORRECTION LEFT SECOND TOE, AUSTIN/AKIN LEFT;  Surgeon: [name], DPM;  Location: [place];  Service: Podiatry;  Laterality: Left;   JOINT REPLACEMENT     LAPAROSCOPIC APPENDECTOMY N/A 12/29/2016   Procedure: APPENDECTOMY LAPAROSCOPIC;  Surgeon: Excell Seltzer, MD;  Location: [facility];  Service: General;  Laterality: N/A;   [place] DISKECTOMY W/ DISSECTION, LEFT L4 -- L5  02-23-2004   METATARSAL OSTEOTOMY Left 04/02/2015   Procedure: LEFT FOOT METATARSAL OSTEOTOMY;  Surgeon: [name], MD;  Location: [place];  Service: Orthopedics;  Laterality: Left;   METATARSAL OSTEOTOMY Left 08/18/2016   Procedure: METATARSAL OSTEOTOMY SECOND TOE;  Surgeon: [name], MD;  Location: [place];  Service: Orthopedics;  Laterality: Left;  ORIF LEFT DISTAL RADIAL FX W/ BONE GRAFT  01-14-2009   POSTERIOR LUMBAR FUSION  05-16-2005   L4 -- L5   RE-DO LUMBAR DISKECTOMY , L4  - L5  05-03-2004   REVERSE SHOULDER ARTHROPLASTY Right 05/23/2021   Procedure: REVERSE SHOULDER ARTHROPLASTY;  Surgeon: Hiram Gash, MD;  Location: [place];  Service: Orthopedics;  Laterality: Right;   TOTAL HIP ARTHROPLASTY  Right 06/18/2018   TOTAL HIP ARTHROPLASTY Right 06/18/2018   Procedure: RIGHT TOTAL HIP ARTHROPLASTY ANTERIOR APPROACH;  Surgeon: [name], MD;  Location: [place];  Service: Orthopedics;  Laterality: Right;   TRIGGER FINGER RELEASE  01/09/2012   Procedure: RELEASE TRIGGER FINGER/A-1 PULLEY;  Surgeon: [name], MD;  Location: [place];  Service: Orthopedics;  Laterality: Left;   VAGINAL HYSTERECTOMY  1993   W/ BILATERAL SALPINGOOPHORECTOMY   WRIST FRACTURE SURGERY Left      A IV Location/Drains/Wounds Patient Lines/Drains/Airways Status     Active Line/Drains/Airways     Name Placement date Placement time Site Days   Peripheral IV 06/07/22 18 G Right Forearm 06/07/22  0231  Forearm  less than 1   Peripheral IV 06/07/22 20 G Left Hand 06/07/22  0301  Hand  less than 1   Incision (Closed) 06/18/18 Hip Right 06/18/18  1059  -- 1450   Incision (Closed) 05/23/21 Shoulder Right 05/23/21  1630  -- 380            Intake/Output Last 24 hours No intake or output data in the 24 hours ending 06/07/22 1859  Labs/Imaging Results for orders placed or performed during the hospital encounter of 06/06/22 (from the past 48 hour(s))  CBG monitoring, ED     Status: Abnormal   Collection Time: 06/06/22  9:16 PM  Result Value Ref Range   Glucose-Capillary 127 (H) 70 - 99 mg/dL    Comment: Glucose reference range applies only to samples taken after fasting for at least 8 hours.  Protime-INR     Status: None   Collection Time: 06/06/22  9:30 PM  Result Value Ref Range   Prothrombin Time 13.9 11.4 - 15.2 seconds   INR 1.1 0.8 - 1.2    Comment: (NOTE) INR goal varies based on device and disease states. Performed at [facility], [place] [address], [place], [place] [address]   APTT     Status: None   Collection Time: 06/06/22  9:30 PM  Result Value Ref Range   aPTT 31 24 - 36 seconds    Comment: Performed at [facility] [address],  [place], [place] [address]  CBC     Status: Abnormal   Collection Time: 06/06/22  9:30 PM  Result Value Ref Range   WBC 5.4 4.0 - 10.5 K/uL   RBC 3.36 (L) 3.87 - 5.11 MIL/uL   Hemoglobin 12.4 12.0 - 15.0 g/dL   HCT 37.1 36.0 - 46.0 %   MCV 110.4 (H) 80.0 - 100.0 fL   MCH 36.9 (H) 26.0 - 34.0 pg   MCHC 33.4 30.0 - 36.0 g/dL   RDW 12.6 11.5 - 15.5 %   Platelets 125 (L) 150 - 400 K/uL   nRBC 0.0 0.0 - 0.2 %    Comment: Performed at [facility] [address] [place], [place] [address]  Differential     Status: None   Collection Time: 06/06/22  9:30 PM  Result Value Ref Range   Neutrophils Relative %  57 %   Neutro Abs 3.1 1.7 - 7.7 K/uL   Lymphocytes Relative 31 %   Lymphs Abs 1.7 0.7 - 4.0 K/uL   Monocytes Relative 10 %   Monocytes Absolute 0.5 0.1 - 1.0 K/uL   Eosinophils Relative 1 %   Eosinophils Absolute 0.0 0.0 - 0.5 K/uL   Basophils Relative 1 %   Basophils Absolute 0.0 0.0 - 0.1 K/uL   Immature Granulocytes 0 %   Abs Immature Granulocytes 0.01 0.00 - 0.07 K/uL    Comment: Performed at [facility] [address] [place], [place] [address]  Comprehensive metabolic panel     Status: Abnormal   Collection Time: 06/06/22  9:30 PM  Result Value Ref Range   Sodium 137 135 - 145 mmol/L   Potassium 3.8 3.5 - 5.1 mmol/L   Chloride 105 98 - 111 mmol/L   CO2 19 (L) 22 - 32 mmol/L   Glucose, Bld 123 (H) 70 - 99 mg/dL    Comment: Glucose reference range applies only to samples taken after fasting for at least 8 hours.   BUN 7 (L) 8 - 23 mg/dL   Creatinine, Ser 0.99 0.44 - 1.00 mg/dL   Calcium 9.1 8.9 - 10.3 mg/dL   Total Protein 6.5 6.5 - 8.1 g/dL   Albumin 4.0 3.5 - 5.0 g/dL   AST 62 (H) 15 - 41 U/L   ALT 53 (H) 0 - 44 U/L   Alkaline Phosphatase 75 38 - 126 U/L   Total Bilirubin 1.0 0.3 - 1.2 mg/dL   GFR, Estimated >60 >60 mL/min    Comment: (NOTE) Calculated using the CKD-EPI Creatinine Equation (2021)    Anion gap 13 5 - 15    Comment: Performed at [facility] [address], [place], [place] [address]  Ethanol     Status: None   Collection Time: 06/06/22  9:55 PM  Result Value Ref Range   Alcohol, Ethyl (B) <10 <10 mg/dL    Comment: (NOTE) Lowest detectable limit for serum alcohol is 10 mg/dL.  For medical purposes only. Performed at [facility], [place] [address] [place], [place] [address]   Ammonia     Status: None   Collection Time: 06/06/22  9:55 PM  Result Value Ref Range   Ammonia 26 9 - 35 umol/L    Comment: Performed at [facility], [place] [address] [place], [place] [address]  CBC     Status: Abnormal   Collection Time: 06/07/22  5:20 AM  Result Value Ref Range   WBC 6.2 4.0 - 10.5 K/uL   RBC 3.15 (L) 3.87 - 5.11 MIL/uL   Hemoglobin 11.4 (L) 12.0 - 15.0 g/dL   HCT 33.7 (L) 36.0 - 46.0 %   MCV 107.0 (H) 80.0 - 100.0 fL   MCH 36.2 (H) 26.0 - 34.0 pg   MCHC 33.8 30.0 - 36.0 g/dL   RDW 12.5 11.5 - 15.5 %   Platelets 141 (L) 150 - 400 K/uL   nRBC 0.0 0.0 - 0.2 %    Comment: Performed at [facility], [place] [address] [place], [place] [address]  Comprehensive metabolic panel     Status: Abnormal   Collection Time: 06/07/22  5:20 AM  Result Value Ref Range   Sodium 134 (L) 135 - 145 mmol/L   Potassium 3.4 (L) 3.5 - 5.1 mmol/L   Chloride 104 98 - 111 mmol/L   CO2 20 (L)  22 - 32 mmol/L   Glucose, Bld 119 (H) 70 - 99 mg/dL    Comment: Glucose reference range applies only to samples taken after fasting for at least 8 hours.   BUN 5 (L) 8 - 23 mg/dL   Creatinine, Ser 0.99 0.44 - 1.00 mg/dL   Calcium 8.5 (L) 8.9 - 10.3 mg/dL   Total Protein 6.2 (L) 6.5 - 8.1 g/dL   Albumin 3.6 3.5 - 5.0 g/dL   AST 60 (H) 15 - 41 U/L   ALT 53 (H) 0 - 44 U/L   Alkaline Phosphatase 68 38 - 126 U/L   Total Bilirubin 0.5 0.3 - 1.2 mg/dL   GFR, Estimated >60 >60 mL/min    Comment: (NOTE) Calculated using the CKD-EPI Creatinine Equation (2021)    Anion gap 10 5 - 15    Comment: Performed at [facility],  [place] [address] [place], [place] [address]  Magnesium     Status: None   Collection Time: 06/07/22  5:20 AM  Result Value Ref Range   Magnesium 2.2 1.7 - 2.4 mg/dL    Comment: Performed at [facility] [address], [place], [place] [address]  Phosphorus     Status: Abnormal   Collection Time: 06/07/22  5:20 AM  Result Value Ref Range   Phosphorus 1.9 (L) 2.5 - 4.6 mg/dL    Comment: Performed at [facility] [address] [place], [place] [address]   EEG adult  Result Date: 06/07/2022 Lora Havens, MD     06/07/2022  8:37 AM Patient Name: Brittany Washington MRN: [id number] Epilepsy Attending: Lora Havens Referring Physician/Provider: Greta Doom, MD Date: 06/07/2022 Duration: 25.33 mins Patient history: 71 year old female with relatively abrupt onset difficulty speaking. EEG to evaluate for seizure Level of alertness: Awake, asleep AEDs during EEG study: Ativan Technical aspects: This EEG study was done with scalp electrodes positioned according to the 10-20 International system of electrode placement. Electrical activity was reviewed with band pass filter of 1-'70Hz'$ , sensitivity of 7 uV/mm, display speed of 53m/sec with a '60Hz'$  notched filter applied as appropriate. EEG data were recorded continuously and digitally stored.  Video monitoring was available and reviewed as appropriate. Description: The posterior dominant rhythm consists of 9-10 Hz activity of moderate voltage (25-35 uV) seen predominantly in posterior head regions, symmetric and reactive to eye opening and eye closing. Sleep was characterized by vertex waves, sleep spindles (12 to 14 Hz), maximal frontocentral region.  Hyperventilation and photic stimulation were not performed.   IMPRESSION: This study is within normal limits. No seizures or epileptiform discharges were seen throughout the recording. A normal interictal EEG does not exclude the diagnosis of epilepsy. PLora Havens  MR BRAIN WO  CONTRAST  Result Date: 06/07/2022 CLINICAL DATA:  Acute neurologic deficit EXAM: MRI HEAD WITHOUT CONTRAST TECHNIQUE: Multiplanar, multiecho pulse sequences of the brain and surrounding structures were obtained without intravenous contrast. COMPARISON:  06/25/2015. FINDINGS: Brain: No acute infarct, mass effect or extra-axial collection. No acute or chronic hemorrhage. There is multifocal hyperintense T2-weighted signal within the white matter. Generalized volume loss. The midline structures are normal. Vascular: Major flow voids are preserved. Skull and upper cervical spine: Normal calvarium and skull base. Visualized upper cervical spine and soft tissues are normal. Sinuses/Orbits:No paranasal sinus fluid levels or advanced mucosal thickening. No mastoid or middle ear effusion. Normal orbits. IMPRESSION: 1. No acute intracranial abnormality. 2. Findings of chronic small vessel ischemia and  volume loss. Electronically Signed   By: Ulyses Jarred M.D.   On: 06/07/2022 02:15   CT ANGIO HEAD NECK W WO CM W PERF (CODE STROKE)  Result Date: 06/06/2022 CLINICAL DATA:  Aphasia and headache EXAM: CT ANGIOGRAPHY HEAD AND NECK CT PERFUSION BRAIN TECHNIQUE: Multidetector CT imaging of the head and neck was performed using the standard protocol during bolus administration of intravenous contrast. Multiplanar CT image reconstructions and MIPs were obtained to evaluate the vascular anatomy. Carotid stenosis measurements (when applicable) are obtained utilizing NASCET criteria, using the distal internal carotid diameter as the denominator. Multiphase CT imaging of the brain was performed following IV bolus contrast injection. Subsequent parametric perfusion maps were calculated using RAPID software. RADIATION DOSE REDUCTION: This exam was performed according to the departmental dose-optimization program which includes automated exposure control, adjustment of the mA and/or kV according to patient size and/or use of iterative  reconstruction technique. CONTRAST:  169m OMNIPAQUE IOHEXOL 350 MG/ML SOLN COMPARISON:  None Available. FINDINGS: CTA NECK FINDINGS SKELETON: There is no bony spinal canal stenosis. No lytic or blastic lesion. OTHER NECK: Normal pharynx, larynx and major salivary glands. No cervical lymphadenopathy. Unremarkable thyroid gland. UPPER CHEST: No pneumothorax or pleural effusion. No nodules or masses. AORTIC ARCH: There is no calcific atherosclerosis of the aortic arch. There is no aneurysm, dissection or hemodynamically significant stenosis of the visualized portion of the aorta. Conventional 3 vessel aortic branching pattern. The visualized proximal subclavian arteries are widely patent. RIGHT CAROTID SYSTEM: Normal without aneurysm, dissection or stenosis. LEFT CAROTID SYSTEM: Normal without aneurysm, dissection or stenosis. VERTEBRAL ARTERIES: Left dominant configuration. Both origins are clearly patent. There is no dissection, occlusion or flow-limiting stenosis to the skull base (V1-V3 segments). CTA HEAD FINDINGS POSTERIOR CIRCULATION: --Vertebral arteries: Normal V4 segments. --Inferior cerebellar arteries: Normal. --Basilar artery: Normal. --Superior cerebellar arteries: Normal. --Posterior cerebral arteries (PCA): Normal. Both are predominantly supplied by the posterior communicating arteries (p-comm). ANTERIOR CIRCULATION: --Intracranial internal carotid arteries: Normal. --Anterior cerebral arteries (ACA): Normal. Both A1 segments are present. Patent anterior communicating artery (a-comm). --Middle cerebral arteries (MCA): Normal. VENOUS SINUSES: As permitted by contrast timing, patent. ANATOMIC VARIANTS: None Review of the MIP images confirms the above findings. CT Brain Perfusion Findings: ASPECTS: 10 CBF (<30%) Volume: 03mPerfusion (Tmax>6.0s) volume: 14m814mismatch Volume: 14mL32mfarction Location:None IMPRESSION: 1. No emergent large vessel occlusion or high-grade stenosis of the intracranial arteries.  2. Normal CT perfusion. Electronically Signed   By: KeviUlyses Jarred.   On: 06/06/2022 22:04   CT HEAD CODE STROKE WO CONTRAST  Result Date: 06/06/2022 CLINICAL DATA:  Code stroke.  Aphasia and right-sided weakness EXAM: CT HEAD WITHOUT CONTRAST TECHNIQUE: Contiguous axial images were obtained from the base of the skull through the vertex without intravenous contrast. RADIATION DOSE REDUCTION: This exam was performed according to the departmental dose-optimization program which includes automated exposure control, adjustment of the mA and/or kV according to patient size and/or use of iterative reconstruction technique. COMPARISON:  None Available. FINDINGS: Brain: There is no mass, hemorrhage or extra-axial collection. The size and configuration of the ventricles and extra-axial CSF spaces are normal. The brain parenchyma is normal, without evidence of acute or chronic infarction. Vascular: No abnormal hyperdensity of the major intracranial arteries or dural venous sinuses. No intracranial atherosclerosis. Skull: The visualized skull base, calvarium and extracranial soft tissues are normal. Sinuses/Orbits: No fluid levels or advanced mucosal thickening of the visualized paranasal sinuses. No mastoid or middle ear effusion. The orbits are normal.  ASPECTS [facility] Stroke Program Early CT Score) - Ganglionic level infarction (caudate, lentiform nuclei, internal capsule, insula, M1-M3 cortex): 7 - Supraganglionic infarction (M4-M6 cortex): 3 Total score (0-10 with 10 being normal): 10 IMPRESSION: 1. No acute intracranial abnormality. 2. ASPECTS is 10 3. These results were communicated to Dr. Roland Rack at 9:28 pm on 06/06/2022 by text page via the [facility] messaging system. Electronically Signed   By: Ulyses Jarred M.D.   On: 06/06/2022 21:28    Pending Labs Unresulted Labs (From admission, onward)     Start     Ordered   06/08/22 0500  CBC  Tomorrow morning,   R        06/07/22 1343   06/08/22 8280   Basic metabolic panel  Tomorrow morning,   R        06/07/22 1343   06/08/22 0500  Magnesium  Tomorrow morning,   R        06/07/22 1343   06/08/22 0500  Phosphorus  Tomorrow morning,   R        06/07/22 1343            Vitals/Pain Today's Vitals   06/07/22 1720 06/07/22 1745 06/07/22 1830 06/07/22 1837  BP:  118/83 128/64   Pulse: 100 100 96   Resp: (!) 22 (!) 21 (!) 23   Temp:    98.3 F (36.8 C)  TempSrc:      SpO2: 96% 95% 99%   Weight:      PainSc:        Isolation Precautions No active isolations  Medications Medications  sodium chloride flush (NS) 0.9 % injection 3 mL (3 mLs Intravenous Not Given 06/07/22 0105)  LORazepam (ATIVAN) injection 0-4 mg ( Intravenous Not Given 06/07/22 1609)    Or  LORazepam (ATIVAN) tablet 0-4 mg ( Oral See Alternative 06/07/22 1609)  LORazepam (ATIVAN) injection 0-4 mg (has no administration in time range)    Or  LORazepam (ATIVAN) tablet 0-4 mg (has no administration in time range)  thiamine (VITAMIN B1) tablet 100 mg (100 mg Oral Given 06/07/22 1100)    Or  thiamine (VITAMIN B1) injection 100 mg ( Intravenous See Alternative 06/07/22 1100)  levETIRAcetam (KEPPRA) IVPB 1000 mg/100 mL premix (0 mg Intravenous Stopped 06/07/22 1629)  loratadine (CLARITIN) tablet 10 mg (10 mg Oral Given 06/07/22 1100)  cholecalciferol (VITAMIN D3) 10 MCG (400 UNIT) tablet 400 Units (400 Units Oral Given 06/07/22 1100)  pantoprazole (PROTONIX) EC tablet 40 mg (40 mg Oral Given 06/07/22 1100)  levothyroxine (SYNTHROID) tablet 125 mcg (125 mcg Oral Not Given 06/07/22 0523)  cyanocobalamin (VITAMIN B12) tablet 1,000 mcg (1,000 mcg Oral Given 06/07/22 1100)  clonazepam (KLONOPIN) disintegrating tablet 0.25 mg (has no administration in time range)  hydrOXYzine (ATARAX) tablet 10 mg (has no administration in time range)  LORazepam (ATIVAN) injection 2 mg (has no administration in time range)  0.9 %  sodium chloride infusion ( Intravenous New Bag/Given 06/07/22 0411)   enoxaparin (LOVENOX) injection 40 mg (40 mg Subcutaneous Given 06/07/22 1609)  chlordiazePOXIDE (LIBRIUM) capsule 10 mg (10 mg Oral Not Given 06/07/22 1520)  iohexol (OMNIPAQUE) 350 MG/ML injection 100 mL (100 mLs Intravenous Contrast Given 06/06/22 2132)  metoCLOPramide (REGLAN) injection 10 mg (10 mg Intravenous Given 06/06/22 2147)  thiamine (VITAMIN B1) 500 mg in normal saline (50 mL) IVPB (0 mg Intravenous Stopped 06/07/22 1609)    Mobility walks with person assist High fall risk   Focused Assessments  R Recommendations: See Admitting Provider Note  Report given to:   Additional Notes:

## 2022-06-07 NOTE — Procedures (Signed)
Patient Name: Brittany Washington  MRN: [id number]  Epilepsy Attending: Lora Havens  Referring Physician/Provider: Greta Doom, MD  Date: 06/07/2022 Duration: 25.33 mins  Patient history: 71 year old female with relatively abrupt onset difficulty speaking. EEG to evaluate for seizure  Level of alertness: Awake, asleep  AEDs during EEG study: Ativan  Technical aspects: This EEG study was done with scalp electrodes positioned according to the 10-20 International system of electrode placement. Electrical activity was reviewed with band pass filter of 1-'70Hz'$ , sensitivity of 7 uV/mm, display speed of 15m/sec with a '60Hz'$  notched filter applied as appropriate. EEG data were recorded continuously and digitally stored.  Video monitoring was available and reviewed as appropriate.  Description: The posterior dominant rhythm consists of 9-10 Hz activity of moderate voltage (25-35 uV) seen predominantly in posterior head regions, symmetric and reactive to eye opening and eye closing. Sleep was characterized by vertex waves, sleep spindles (12 to 14 Hz), maximal frontocentral region.  Hyperventilation and photic stimulation were not performed.     IMPRESSION: This study is within normal limits. No seizures or epileptiform discharges were seen throughout the recording.  A normal interictal EEG does not exclude the diagnosis of epilepsy.   Yuniel Blaney OBarbra Sarks

## 2022-06-07 NOTE — ED Provider Notes (Addendum)
Patient signed out pending MRI.  Patient presented with episode of aphasia and headache.  Thought to be likely complex migraine.  Neurology recommended MRI.  I was called to the room at the beginning of my shift with concerns for recurrence of symptoms.  On my evaluation she is fluent, she can name and repeat, she appears neurologically intact.  She has a slightly tremulous lip.  She did receive Reglan which could account for this.  MRI pending.  2:23 AM MRI without any acute stroke.  Will provide neurology follow-up  2:51 AM I was called back into the room.  Patient apparently had a seizure in front of nursing.  She appeared postictal.  See clinical course below.  New historical information suggest that she is at high risk for alcohol withdrawal given daily alcohol consumption and last use on Saturday.  She was placed on CIWA protocol and loaded with IV Keppra.  Will need an EEG in the morning per Dr. Leonel Ramsay.  Family updated at the bedside.  With patient's daughter.  She is very grateful and was very forthcoming regarding her mom's functional alcohol abuse.  She did recently have a GI bleed and had an endoscopy on 10/2.  This is what prompted her to stop drinking.  Physical Exam  BP (!) 150/78   Pulse 75   Temp 97.9 F (36.6 C) (Oral)   Resp 16   Wt 70.6 kg   SpO2 95%   BMI 26.72 kg/m   Physical Exam Awake, alert, no acute distress Slight tremor of the lower lip Neuro intact Procedures  .Critical Care  Performed by: Merryl Hacker, MD Authorized by: Merryl Hacker, MD   Critical care provider statement:    Critical care time (minutes):  35   Critical care was necessary to treat or prevent imminent or life-threatening deterioration of the following conditions: Alcohol withdrawal.   Critical care was time spent personally by me on the following activities:  Development of treatment plan with patient or surrogate, discussions with consultants, evaluation of patient's  response to treatment, examination of patient, ordering and review of laboratory studies, ordering and review of radiographic studies, ordering and performing treatments and interventions, pulse oximetry, re-evaluation of patient's condition and review of old charts   ED Course / MDM   Clinical Course as of 06/07/22 0251  Wed Jun 07, 2022  0247 I was called emergently to the bedside at discharge.  Patient appeared to have a seizure per nursing.  They witnessed tonic-clonic activity and patient got tachycardic and hypoxic.  On my evaluation, she appears postictal.  She is on a nonrebreather.  Heart rate in the 140s.  Apparently her daughter just called and informed the nurse that she drinks at least 1 bottle of wine per day.  She stopped drinking on Saturday.  No known history of alcohol withdrawal or alcohol withdrawal seizures.  She did have a history of febrile seizures as a child.  She would be within the window of alcohol withdrawal seizures.  Patient was placed on CIWA protocol.  Neurology was updated.  He also recommends loading with IV Keppra and EEG in the morning. [CH]    Clinical Course User Index [CH] Kidada Ging, Barbette Hair, MD   Medical Decision Making Amount and/or Complexity of Data Reviewed Labs: ordered. Radiology: ordered.  Risk OTC drugs. Prescription drug management. Decision regarding hospitalization.   Problem List Items Addressed This Visit   None Visit Diagnoses     Complicated migraine    -  Primary   Relevant Medications   levETIRAcetam (KEPPRA) IVPB 1000 mg/100 mL premix (Start on 06/07/2022  3:00 AM)   Seizure (HCC)       Relevant Medications   levETIRAcetam (KEPPRA) IVPB 1000 mg/100 mL premix (Start on 06/07/2022  3:00 AM)   Alcohol withdrawal syndrome, with delirium [facility])                 Claudie Brickhouse, Barbette Hair, MD 06/07/22 5284    Merryl Hacker, MD 06/07/22 1324    Merryl Hacker, MD 06/07/22 4010    Merryl Hacker, MD 06/07/22  [phone]

## 2022-06-07 NOTE — ED Notes (Signed)
Dr Dina Rich called to bedside at this time due to possible seizure

## 2022-06-07 NOTE — ED Notes (Signed)
PT at bedside.

## 2022-06-07 NOTE — ED Notes (Signed)
Neurology at bedside.

## 2022-06-07 NOTE — ED Notes (Signed)
Patient transported to MRI 

## 2022-06-07 NOTE — Progress Notes (Signed)
Patient was seen by [facility] yesterday at [facility] - patient denied substance abuse resources.  Madilyn Fireman, MSW, LCSW Transitions of Care  Clinical Social Worker II [phone]

## 2022-06-08 ENCOUNTER — Other Ambulatory Visit (HOSPITAL_COMMUNITY): Payer: Self-pay

## 2022-06-08 DIAGNOSIS — F1010 Alcohol abuse, uncomplicated: Secondary | ICD-10-CM | POA: Diagnosis not present

## 2022-06-08 DIAGNOSIS — E890 Postprocedural hypothyroidism: Secondary | ICD-10-CM | POA: Diagnosis not present

## 2022-06-08 DIAGNOSIS — R569 Unspecified convulsions: Secondary | ICD-10-CM | POA: Diagnosis not present

## 2022-06-08 LAB — BASIC METABOLIC PANEL
Anion gap: 6 (ref 5–15)
BUN: <5 mg/dL — ABNORMAL LOW (ref 8–23)
CO2: 25 mmol/L (ref 22–32)
Calcium: 8 mg/dL — ABNORMAL LOW (ref 8.9–10.3)
Chloride: 104 mmol/L (ref 98–111)
Creatinine, Ser: 1.1 mg/dL — ABNORMAL HIGH (ref 0.44–1.00)
GFR, Estimated: 54 mL/min — ABNORMAL LOW (ref >60)
Glucose, Bld: 104 mg/dL — ABNORMAL HIGH (ref 70–99)
Potassium: 3.7 mmol/L (ref 3.5–5.1)
Sodium: 135 mmol/L (ref 135–145)

## 2022-06-08 LAB — CBC
HCT: 33.2 % — ABNORMAL LOW (ref 36.0–46.0)
Hemoglobin: 11.4 g/dL — ABNORMAL LOW (ref 12.0–15.0)
MCH: 36.8 pg — ABNORMAL HIGH (ref 26.0–34.0)
MCHC: 34.3 g/dL (ref 30.0–36.0)
MCV: 107.1 fL — ABNORMAL HIGH (ref 80.0–100.0)
Platelets: 128 10*3/uL — ABNORMAL LOW (ref 150–400)
RBC: 3.1 MIL/uL — ABNORMAL LOW (ref 3.87–5.11)
RDW: 12.5 % (ref 11.5–15.5)
WBC: 5.4 10*3/uL (ref 4.0–10.5)
nRBC: 0 % (ref 0.0–0.2)

## 2022-06-08 LAB — MAGNESIUM: Magnesium: 2 mg/dL (ref 1.7–2.4)

## 2022-06-08 LAB — PHOSPHORUS: Phosphorus: 3.5 mg/dL (ref 2.5–4.6)

## 2022-06-08 MED ORDER — CHLORDIAZEPOXIDE HCL 10 MG PO CAPS
ORAL_CAPSULE | ORAL | 0 refills | Status: AC
  Filled 2022-06-08: qty 30, 12d supply, fill #0

## 2022-06-08 MED ORDER — SALINE SPRAY 0.65 % NA SOLN
2.0000 | NASAL | Status: DC | PRN
  Administered 2022-06-08: 2 via NASAL
  Filled 2022-06-08: qty 44

## 2022-06-08 NOTE — Discharge Summary (Signed)
Physician Discharge Summary  Brittany Washington YPP:[id number] DOB: 10-Feb-1951 DOA: 06/06/2022  PCP: Charlane Ferretti, MD  Admit date: 06/06/2022 Discharge date: 06/08/2022  Admitted From: Home Disposition:  Home   Recommendations for Outpatient Follow-up:  Follow up with PCP in 1-2 weeks Please obtain BMP/CBC in one week Continue counseling about alcohol abuse and cessation Patient to follow with neurology as an outpatient  Discharge Condition Stable CODE STATUS:FULL Diet recommendation:  Regular   Brief/Interim Summary:  71 year old withwith medical history significant for recent admission for hematemesis secondary to gastritis seen on EGD, chronic anxiety, chronic alcohol abuse, hypertension, hyperlipidemia, hypothyroidism, presents with abrupt onset of difficulty speaking brought in as a code stroke, MRI was negative, then she was noted to have seizures while in ED, felt to be secondary to alcohol withdrawal seizures, EEG was normal , she was seen by neurology, no recommendation for antiepileptic seizures medication as it is secondary to seizures versus version.    Seizure activity suspect secondary to alcohol withdrawal -This is secondary to alcohol withdrawals, there is no recurrent of seizures, I have discussed in detail with her about the seizure precautions and she acknowledged understanding.    Chronic alcohol abuse with concern for alcohol withdrawal No alcohol intake for the past few days due to hospital stay at [facility] for GI bleed, she was in window for alcohol withdrawals.  -Patient with multivitamins, thiamine and folic acid. -Was started on Librium during hospital stay to prevent withdrawals, she will be discharged on Librium taper  Acute metabolic encephalopathy/expressive aphasia No evidence of acute CVA on MRI brain This may be related to complex migraine versus post post ictal status resolved   B12 deficiency  continue with supplemen, patient reports she has  supplements at home already from recent discharge   Macrocytic anemia -With recent diagnosis of upper GI bleed during recent hospitalization she was discharged yesterday, continue with PPI twice daily   Non-anion gap metabolic acidosis Solved with hydration   Recent history of hematemesis with gastritis seen on EGD Resume home regimen Restart home PPI p.o. Protonix 40 mg twice daily.   Hypothyroidism Resume home levothyroxine         Discharge Diagnoses:  Principal Problem:   Seizure [facility]) Active Problems:   Hypothyroid   Alcohol abuse    Discharge Instructions  Discharge Instructions     Ambulatory referral to Neurology   Complete by: As directed    An appointment is requested in approximately: 2 weeks   Ambulatory referral to Occupational Therapy   Complete by: As directed    Diet - low sodium heart healthy   Complete by: As directed    Discharge instructions   Complete by: As directed          [place] Per [facility] statutes, patients with seizures are not allowed to drive until they have been seizure-free for six months.    Use caution when using heavy equipment or power tools. Avoid working on ladders or at heights. Take showers instead of baths. Ensure the water temperature is not too high on the home water heater. Do not go swimming alone. Do not lock yourself in a room alone (i.e. bathroom). When caring for infants or small children, sit down when holding, feeding, or changing them to minimize risk of injury to the child in the event you have a seizure. Maintain good sleep hygiene. Avoid alcohol.    If patient has another seizure, call 911 and bring them back  to the ED if: A.  The seizure lasts longer than 5 minutes.      B.  The patient doesn't wake shortly after the seizure or has new problems such as difficulty seeing, speaking or moving following the seizure C.  The patient was injured during the seizure D.  The patient has a  temperature over 102 F (39C) E.  The patient vomited during the seizure and now is having trouble breathing       Increase activity slowly   Complete by: As directed       Allergies as of 06/08/2022       Reactions   Sulfa Antibiotics Diarrhea, Other (See Comments)   Body aches    Fosamax [alendronate] Other (See Comments)   Heartburn, myalgias   Nsaids Other (See Comments)   History of GI bleeding        Medication List     TAKE these medications    cetirizine 10 MG tablet Commonly known as: ZYRTEC Take 10 mg by mouth at bedtime.   chlordiazePOXIDE 10 MG capsule Commonly known as: LIBRIUM Take 1 capsule (10 mg total) by mouth 4 (four) times daily for 3 days, THEN 1 capsule (10 mg total) 3 (three) times daily for 3 days, THEN 1 capsule (10 mg total) 2 (two) times daily for 3 days, THEN 1 capsule (10 mg total) daily for 3 days. Start taking on: June 08, 2022   clonazePAM 1 MG tablet Commonly known as: KLONOPIN Take 1 tablet (1 mg total) by mouth at bedtime. Take 1-2 tablets by mouth daily at bedtime as needed for sleep. What changed:  how much to take additional instructions   cyanocobalamin 1000 MCG tablet Take 1 tablet (1,000 mcg total) by mouth daily.   denosumab 60 MG/ML Sosy injection Commonly known as: PROLIA Inject 60 mg into the skin every 6 (six) months.   diphenhydrAMINE 25 MG tablet Commonly known as: BENADRYL Take 25 mg by mouth at bedtime as needed for allergies.   diphenoxylate-atropine 2.5-0.025 MG tablet Commonly known as: LOMOTIL Take 1 tablet by mouth 4 (four) times daily as needed for diarrhea or loose stools.   hydrochlorothiazide 12.5 MG capsule Commonly known as: MICROZIDE TAKE 1 CAPSULE BY MOUTH DAILY. What changed: when to take this   levothyroxine 125 MCG tablet Commonly known as: SYNTHROID Take 1 tablet (125 mcg total) by mouth daily.   methocarbamol 500 MG tablet Commonly known as: ROBAXIN Take 500 mg by mouth 2 (two)  times daily as needed for muscle spasms.   metoprolol tartrate 25 MG tablet Commonly known as: LOPRESSOR Take 12.5 mg by mouth 2 (two) times daily.   ondansetron 4 MG disintegrating tablet Commonly known as: ZOFRAN-ODT Take 4 mg by mouth every 6 (six) hours as needed for nausea or vomiting.   pantoprazole 40 MG tablet Commonly known as: PROTONIX Take 1 tablet (40 mg total) by mouth 2 (two) times daily.   thiamine 100 MG tablet Commonly known as: Vitamin B-1 Take 1 tablet (100 mg total) by mouth daily.   Turmeric 500 MG Caps Take 500 mg by mouth daily with breakfast.   TYLENOL 500 MG tablet Generic drug: acetaminophen Take 500-1,000 mg by mouth every 8 (eight) hours as needed (for mild pain).   valsartan 160 MG tablet Commonly known as: DIOVAN Take 160 mg by mouth daily.   Vitamin D3 10 MCG (400 UNIT) Caps Take 400 Units by mouth daily.        Follow-up  Information     [facility]. Schedule an appointment as soon as possible for a visit in 1 week(s).   Specialty: Rehabilitation Contact information: [address] [place], [place] [phone] [place] [address] [phone]               Allergies  Allergen Reactions   Sulfa Antibiotics Diarrhea and Other (See Comments)    Body aches    Fosamax [Alendronate] Other (See Comments)    Heartburn, myalgias   Nsaids Other (See Comments)    History of GI bleeding    Consultations: Neurology   Procedures/Studies: EEG adult  Result Date: July 07, 2022 Lora Havens, MD     07-07-22  8:37 AM Patient Name: Brittany Washington MRN: [id number] Epilepsy Attending: Lora Havens Referring Physician/Provider: Greta Doom, MD Date: 07-07-22 Duration: 25.33 mins Patient history: 71 year old female with relatively abrupt onset difficulty speaking. EEG to evaluate for seizure Level of alertness: Awake, asleep AEDs during EEG study: Ativan Technical aspects: This EEG  study was done with scalp electrodes positioned according to the 10-20 International system of electrode placement. Electrical activity was reviewed with band pass filter of 1-'70Hz'$ , sensitivity of 7 uV/mm, display speed of 50m/sec with a '60Hz'$  notched filter applied as appropriate. EEG data were recorded continuously and digitally stored.  Video monitoring was available and reviewed as appropriate. Description: The posterior dominant rhythm consists of 9-10 Hz activity of moderate voltage (25-35 uV) seen predominantly in posterior head regions, symmetric and reactive to eye opening and eye closing. Sleep was characterized by vertex waves, sleep spindles (12 to 14 Hz), maximal frontocentral region.  Hyperventilation and photic stimulation were not performed.   IMPRESSION: This study is within normal limits. No seizures or epileptiform discharges were seen throughout the recording. A normal interictal EEG does not exclude the diagnosis of epilepsy. PLora Havens  MR BRAIN WO CONTRAST  Result Date: 111/03/23CLINICAL DATA:  Acute neurologic deficit EXAM: MRI HEAD WITHOUT CONTRAST TECHNIQUE: Multiplanar, multiecho pulse sequences of the brain and surrounding structures were obtained without intravenous contrast. COMPARISON:  06/25/2015. FINDINGS: Brain: No acute infarct, mass effect or extra-axial collection. No acute or chronic hemorrhage. There is multifocal hyperintense T2-weighted signal within the white matter. Generalized volume loss. The midline structures are normal. Vascular: Major flow voids are preserved. Skull and upper cervical spine: Normal calvarium and skull base. Visualized upper cervical spine and soft tissues are normal. Sinuses/Orbits:No paranasal sinus fluid levels or advanced mucosal thickening. No mastoid or middle ear effusion. Normal orbits. IMPRESSION: 1. No acute intracranial abnormality. 2. Findings of chronic small vessel ischemia and volume loss. Electronically Signed   By: KUlyses JarredM.D.   On: 111-11-2300:15   CT ANGIO HEAD NECK W WO CM W PERF (CODE STROKE)  Result Date: 06/06/2022 CLINICAL DATA:  Aphasia and headache EXAM: CT ANGIOGRAPHY HEAD AND NECK CT PERFUSION BRAIN TECHNIQUE: Multidetector CT imaging of the head and neck was performed using the standard protocol during bolus administration of intravenous contrast. Multiplanar CT image reconstructions and MIPs were obtained to evaluate the vascular anatomy. Carotid stenosis measurements (when applicable) are obtained utilizing NASCET criteria, using the distal internal carotid diameter as the denominator. Multiphase CT imaging of the brain was performed following IV bolus contrast injection. Subsequent parametric perfusion maps were calculated using RAPID software. RADIATION DOSE REDUCTION: This exam was performed according to the departmental dose-optimization program which includes automated exposure control, adjustment of the mA and/or kV  according to patient size and/or use of iterative reconstruction technique. CONTRAST:  178m OMNIPAQUE IOHEXOL 350 MG/ML SOLN COMPARISON:  None Available. FINDINGS: CTA NECK FINDINGS SKELETON: There is no bony spinal canal stenosis. No lytic or blastic lesion. OTHER NECK: Normal pharynx, larynx and major salivary glands. No cervical lymphadenopathy. Unremarkable thyroid gland. UPPER CHEST: No pneumothorax or pleural effusion. No nodules or masses. AORTIC ARCH: There is no calcific atherosclerosis of the aortic arch. There is no aneurysm, dissection or hemodynamically significant stenosis of the visualized portion of the aorta. Conventional 3 vessel aortic branching pattern. The visualized proximal subclavian arteries are widely patent. RIGHT CAROTID SYSTEM: Normal without aneurysm, dissection or stenosis. LEFT CAROTID SYSTEM: Normal without aneurysm, dissection or stenosis. VERTEBRAL ARTERIES: Left dominant configuration. Both origins are clearly patent. There is no dissection, occlusion  or flow-limiting stenosis to the skull base (V1-V3 segments). CTA HEAD FINDINGS POSTERIOR CIRCULATION: --Vertebral arteries: Normal V4 segments. --Inferior cerebellar arteries: Normal. --Basilar artery: Normal. --Superior cerebellar arteries: Normal. --Posterior cerebral arteries (PCA): Normal. Both are predominantly supplied by the posterior communicating arteries (p-comm). ANTERIOR CIRCULATION: --Intracranial internal carotid arteries: Normal. --Anterior cerebral arteries (ACA): Normal. Both A1 segments are present. Patent anterior communicating artery (a-comm). --Middle cerebral arteries (MCA): Normal. VENOUS SINUSES: As permitted by contrast timing, patent. ANATOMIC VARIANTS: None Review of the MIP images confirms the above findings. CT Brain Perfusion Findings: ASPECTS: 10 CBF (<30%) Volume: 018mPerfusion (Tmax>6.0s) volume: 63m33mismatch Volume: 63mL1mfarction Location:None IMPRESSION: 1. No emergent large vessel occlusion or high-grade stenosis of the intracranial arteries. 2. Normal CT perfusion. Electronically Signed   By: KeviUlyses Jarred.   On: 06/06/2022 22:04   CT HEAD CODE STROKE WO CONTRAST  Result Date: 06/06/2022 CLINICAL DATA:  Code stroke.  Aphasia and right-sided weakness EXAM: CT HEAD WITHOUT CONTRAST TECHNIQUE: Contiguous axial images were obtained from the base of the skull through the vertex without intravenous contrast. RADIATION DOSE REDUCTION: This exam was performed according to the departmental dose-optimization program which includes automated exposure control, adjustment of the mA and/or kV according to patient size and/or use of iterative reconstruction technique. COMPARISON:  None Available. FINDINGS: Brain: There is no mass, hemorrhage or extra-axial collection. The size and configuration of the ventricles and extra-axial CSF spaces are normal. The brain parenchyma is normal, without evidence of acute or chronic infarction. Vascular: No abnormal hyperdensity of the major  intracranial arteries or dural venous sinuses. No intracranial atherosclerosis. Skull: The visualized skull base, calvarium and extracranial soft tissues are normal. Sinuses/Orbits: No fluid levels or advanced mucosal thickening of the visualized paranasal sinuses. No mastoid or middle ear effusion. The orbits are normal. ASPECTS ([facility] Early CT Score) - Ganglionic level infarction (caudate, lentiform nuclei, internal capsule, insula, M1-M3 cortex): 7 - Supraganglionic infarction (M4-M6 cortex): 3 Total score (0-10 with 10 being normal): 10 IMPRESSION: 1. No acute intracranial abnormality. 2. ASPECTS is 10 3. These results were communicated to Dr. McNeRoland Rack9:28 pm on 06/06/2022 by text page via the [facility] system. Electronically Signed   By: KeviUlyses Jarred.   On: 06/06/2022 21:28      Subjective: No significant events overnight, she denies any complaints.  Discharge Exam: Vitals:   06/08/22 0422 06/08/22 0813  BP: 130/69 (!) 140/80  Pulse: 69 86  Resp: 16 18  Temp: 98.5 F (36.9 C) 98.1 F (36.7 C)  SpO2: 97% 96%   Vitals:   06/07/22 2201 06/08/22 0011 06/08/22 0422 06/08/22 0813  BP: 135/72  131/73 130/69 (!) 140/80  Pulse: 76 77 69 86  Resp: '15 14 16 18  '$ Temp: 98.6 F (37 C) 98.7 F (37.1 C) 98.5 F (36.9 C) 98.1 F (36.7 C)  TempSrc: Oral Oral Oral Oral  SpO2: 95% 97% 97% 96%  Weight:      Height:        General: Pt is alert, awake, not in acute distress Cardiovascular: RRR, S1/S2 +, no rubs, no gallops Respiratory: CTA bilaterally, no wheezing, no rhonchi Abdominal: Soft, NT, ND, bowel sounds + Extremities: no edema, no cyanosis    The results of significant diagnostics from this hospitalization (including imaging, microbiology, ancillary and laboratory) are listed below for reference.     Microbiology: No results found for this or any previous visit (from the past 240 hour(s)).   Labs: BNP (last 3 results) No results for  input(s): "BNP" in the last 8760 hours. Basic Metabolic Panel: Recent Labs  Lab 06/05/22 0402 06/05/22 0654 06/06/22 0344 06/06/22 2130 06/07/22 0520 06/08/22 0500  NA 136  --  140 137 134* 135  K 3.2*  --  4.3 3.8 3.4* 3.7  CL 106  --  111 105 104 104  CO2 21*  --  24 19* 20* 25  GLUCOSE 99  --  109* 123* 119* 104*  BUN 11  --  7* 7* 5* <5*  CREATININE 0.98  --  0.84 0.99 0.99 1.10*  CALCIUM 8.1*  --  8.1* 9.1 8.5* 8.0*  MG  --  1.7  --   --  2.2 2.0  PHOS  --   --   --   --  1.9* 3.5   Liver Function Tests: Recent Labs  Lab 06/04/22 1253 06/05/22 0402 06/06/22 2130 06/07/22 0520  AST 124* 70* 62* 60*  ALT 94* 62* 53* 53*  ALKPHOS 87 69 75 68  BILITOT 0.6 1.1 1.0 0.5  PROT 7.6 6.0* 6.5 6.2*  ALBUMIN 4.5 3.6 4.0 3.6   No results for input(s): "LIPASE", "AMYLASE" in the last 168 hours. Recent Labs  Lab 06/06/22 2155  AMMONIA 26   CBC: Recent Labs  Lab 06/04/22 1253 06/05/22 0402 06/05/22 1637 06/06/22 0344 06/06/22 2130 06/07/22 0520 06/08/22 0500  WBC 5.9   < > 4.3 4.1 5.4 6.2 5.4  NEUTROABS 4.0  --   --   --  3.1  --   --   HGB 13.7   < > 11.3* 11.6* 12.4 11.4* 11.4*  HCT 39.0   < > 34.6* 35.7* 37.1 33.7* 33.2*  MCV 103.7*   < > 110.2* 110.5* 110.4* 107.0* 107.1*  PLT 193   < > 131* 125* 125* 141* 128*   < > = values in this interval not displayed.   Cardiac Enzymes: No results for input(s): "CKTOTAL", "CKMB", "CKMBINDEX", "TROPONINI" in the last 168 hours. BNP: Invalid input(s): "POCBNP" CBG: Recent Labs  Lab 06/06/22 2116  GLUCAP 127*   D-Dimer No results for input(s): "DDIMER" in the last 72 hours. Hgb A1c No results for input(s): "HGBA1C" in the last 72 hours. Lipid Profile No results for input(s): "CHOL", "HDL", "LDLCALC", "TRIG", "CHOLHDL", "LDLDIRECT" in the last 72 hours. Thyroid function studies No results for input(s): "TSH", "T4TOTAL", "T3FREE", "THYROIDAB" in the last 72 hours.  Invalid input(s): "FREET3" Anemia work up No  results for input(s): "VITAMINB12", "FOLATE", "FERRITIN", "TIBC", "IRON", "RETICCTPCT" in the last 72 hours. Urinalysis    Component Value Date/Time   COLORURINE YELLOW 06/18/2018 1157   APPEARANCEUR HAZY (  A) 06/18/2018 1157   LABSPEC <1.005 (L) 06/18/2018 1157   PHURINE 5.0 06/18/2018 1157   GLUCOSEU NEGATIVE 06/18/2018 1157   HGBUR LARGE (A) 06/18/2018 1157   BILIRUBINUR NEGATIVE 06/18/2018 1157   KETONESUR NEGATIVE 06/18/2018 1157   PROTEINUR NEGATIVE 06/18/2018 1157   UROBILINOGEN 0.2 06/25/2015 0903   NITRITE NEGATIVE 06/18/2018 1157   LEUKOCYTESUR SMALL (A) 06/18/2018 1157   Sepsis Labs Recent Labs  Lab 06/06/22 0344 06/06/22 2130 06/07/22 0520 06/08/22 0500  WBC 4.1 5.4 6.2 5.4   Microbiology No results found for this or any previous visit (from the past 240 hour(s)).   Time coordinating discharge: Over 30 minutes  SIGNED:   Phillips Climes, MD  [facility] 06/08/2022, 5:10 PM Pager   If 7PM-7AM, please contact night-coverage [email or web address]

## 2022-06-08 NOTE — Evaluation (Signed)
Occupational Therapy Evaluation Patient Details Name: Brittany Washington MRN: [id number] DOB: 1950/10/20 Today's Date: 06/08/2022   History of Present Illness Pt is a 71 y/o female admitted secondary to headache and confusion. MRI negative. Was about to be discharged and RN noted seizure like activity, so pt admitted. Recent admission for GI bleed. PMH includes alcohol abuse and HTN.   Clinical Impression   PTA patient independent, driving. Admitted for above and presents with problem list below, including impaired cognition, mild unsteadiness and impaired balance.  Pt engages appropriately throughout session, but scored 8/28 on short blessed test demonstrating questionable impairment with recall, attention and sequencing.  Also noted difficulty with dual cognitive tasks, needing to stop in hallway to answer questions.  She denies cognitive changes, but based on PLOF with driving and volunteering anticipate mild deficits.  Completes transfers, mobility and ADLs with supervision- guarded mobility but no assist required.  Educated on no driving, assist with med mgmt and IADLs at this time--pt agreeable.  Will follow acutely and recommend Outpatient OT services at dc to optimize return to independence.      Recommendations for follow up therapy are one component of a multi-disciplinary discharge planning process, led by the attending physician.  Recommendations may be updated based on patient status, additional functional criteria and insurance authorization.   Follow Up Recommendations  Outpatient OT    Assistance Recommended at Discharge Intermittent Supervision/Assistance  Patient can return home with the following Direct supervision/assist for financial management;Direct supervision/assist for medications management;Assistance with cooking/housework;Assist for transportation    Functional Status Assessment  Patient has had a recent decline in their functional status and demonstrates the  ability to make significant improvements in function in a reasonable and predictable amount of time.  Equipment Recommendations  None recommended by OT    Recommendations for Other Services       Precautions / Restrictions Precautions Precautions: Fall Restrictions Weight Bearing Restrictions: No      Mobility Bed Mobility               General bed mobility comments: EOB upon entry    Transfers Overall transfer level: Needs assistance   Transfers: Sit to/from Stand Sit to Stand: Supervision           General transfer comment: supervision for safety, guarded with mobility and transfers      Balance Overall balance assessment: Needs assistance Sitting-balance support: No upper extremity supported, Feet supported Sitting balance-Leahy Scale: Good     Standing balance support: No upper extremity supported, During functional activity Standing balance-Leahy Scale: Fair                             ADL either performed or assessed with clinical judgement   ADL Overall ADL's : Needs assistance/impaired     Grooming: Oral care;Supervision/safety;Standing           Upper Body Dressing : Supervision/safety;Sitting   Lower Body Dressing: Supervision/safety;Sit to/from stand   Toilet Transfer: Supervision/safety;Ambulation   Toileting- Clothing Manipulation and Hygiene: Supervision/safety;Sit to/from stand       Functional mobility during ADLs: Supervision/safety       Vision Baseline Vision/History: 1 Wears glasses (contacts) Ability to See in Adequate Light: 0 Adequate Patient Visual Report: No change from baseline Vision Assessment?: No apparent visual deficits     Perception     Praxis      Pertinent Vitals/Pain Pain Assessment Pain Assessment: Faces Faces Pain Scale:  Hurts little more Pain Location: R hip Pain Descriptors / Indicators: Guarding, Grimacing Pain Intervention(s): Limited activity within patient's tolerance,  Monitored during session, Repositioned     Hand Dominance Right   Extremity/Trunk Assessment Upper Extremity Assessment Upper Extremity Assessment: Overall WFL for tasks assessed   Lower Extremity Assessment Lower Extremity Assessment: Defer to PT evaluation   Cervical / Trunk Assessment Cervical / Trunk Assessment: Normal   Communication Communication Communication: No difficulties   Cognition Arousal/Alertness: Awake/alert Behavior During Therapy: WFL for tasks assessed/performed Overall Cognitive Status: No family/caregiver present to determine baseline cognitive functioning Area of Impairment: Attention, Problem solving, Memory                   Current Attention Level: Selective Memory: Decreased short-term memory       Problem Solving: Slow processing, Difficulty sequencing, Requires verbal cues General Comments: pt reports cognition is baseline, no changes noted.  Completed short blessed test during functional mobility in hallway (mild distractions) and pt demonstrates difficulty with dual cognitive tasks (walking and talking), demonstrates difficulty with recall, sequencing and attention to task.  Overall scoring 8/28 (questionalbe impairment). WIll follow.     General Comments  reviewed assist with med mgmt, no driving and limited iADLs- pt agreeable    Exercises     Shoulder Instructions      Home Living Family/patient expects to be discharged to:: Private residence Living Arrangements: Spouse/significant other Available Help at Discharge: Family;Available 24 hours/day Type of Home: House Home Access: Stairs to enter [employer] of Steps: 10 Entrance Stairs-Rails: Left;Right Home Layout: Two level;Able to live on main level with bedroom/bathroom;Full bath on main level     Bathroom Shower/Tub: Occupational psychologist: Handicapped height     Home Equipment: Conservation officer, nature (2 wheels);Cane - single point;Shower seat;Grab bars -  tub/shower;Grab bars - toilet          Prior Functioning/Environment Prior Level of Function : Independent/Modified Independent;Driving               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, retired Marine scientist        OT Problem List: Impaired balance (sitting and/or standing);Decreased cognition;Decreased knowledge of precautions      OT Treatment/Interventions: Self-care/ADL training;DME and/or AE instruction;Therapeutic activities;Patient/family education;Balance training;Cognitive remediation/compensation    OT Goals(Current goals can be found in the care plan section) Acute Rehab OT Goals Patient Stated Goal: home today OT Goal Formulation: With patient Time For Goal Achievement: 06/22/22 Potential to Achieve Goals: Good  OT Frequency: Min 2X/week    Co-evaluation              AM-PAC OT "6 Clicks" Daily Activity     Outcome Measure Help from another person eating meals?: None Help from another person taking care of personal grooming?: A Little Help from another person toileting, which includes using toliet, bedpan, or urinal?: A Little Help from another person bathing (including washing, rinsing, drying)?: A Little Help from another person to put on and taking off regular upper body clothing?: A Little Help from another person to put on and taking off regular lower body clothing?: A Little 6 Click Score: 19   End of Session Nurse Communication: Mobility status;Other (comment) (cognition)  Activity Tolerance: Patient tolerated treatment well Patient left: with call bell/phone within reach;Other (comment) (seated EOB)  OT Visit Diagnosis: Other abnormalities of gait and mobility (R26.89);Other symptoms and signs involving cognitive function  Time: 5947-0761 OT Time Calculation (min): 14 min Charges:  OT General Charges $OT Visit: 1 Visit OT Evaluation $OT Eval Moderate Complexity: [place], OT Acute Rehabilitation Services Office  [phone]   Brittany Washington 06/08/2022, 9:47 AM

## 2022-06-08 NOTE — TOC Transition Note (Signed)
Transition of Care [facility]) - CM/SW Discharge Note   Patient Details  Name: Brittany Washington MRN: [id number] Date of Birth: 1951-01-29  [facility]) CM/SW Contact:  Pollie Friar, RN Phone Number: 06/08/2022, 12:23 PM   Clinical Narrative:    Pt is discharging home with outpatient therapy through [place]. Information on the AVS.  No DME needs.  Daughter is providing transportation home.    Final next level of care: OP Rehab Barriers to Discharge: No Barriers Identified   Patient Goals and CMS Choice     Choice offered to / list presented to : Patient  Discharge Placement                       Discharge Plan and Services                                     Social Determinants of Health (SDOH) Interventions Food Insecurity Interventions: Intervention Not Indicated Housing Interventions: Intervention Not Indicated Transportation Interventions: Intervention Not Indicated Utilities Interventions: Intervention Not Indicated   Readmission Risk Interventions     No data to display

## 2022-06-09 ENCOUNTER — Encounter: Payer: Self-pay | Admitting: Neurology

## 2022-06-13 DIAGNOSIS — Z23 Encounter for immunization: Secondary | ICD-10-CM | POA: Diagnosis not present

## 2022-06-13 DIAGNOSIS — F10930 Alcohol use, unspecified with withdrawal, uncomplicated: Secondary | ICD-10-CM | POA: Diagnosis not present

## 2022-06-13 DIAGNOSIS — R300 Dysuria: Secondary | ICD-10-CM | POA: Diagnosis not present

## 2022-06-13 DIAGNOSIS — K2900 Acute gastritis without bleeding: Secondary | ICD-10-CM | POA: Diagnosis not present

## 2022-06-13 DIAGNOSIS — N898 Other specified noninflammatory disorders of vagina: Secondary | ICD-10-CM | POA: Diagnosis not present

## 2022-06-13 DIAGNOSIS — E039 Hypothyroidism, unspecified: Secondary | ICD-10-CM | POA: Diagnosis not present

## 2022-06-13 DIAGNOSIS — I10 Essential (primary) hypertension: Secondary | ICD-10-CM | POA: Diagnosis not present

## 2022-06-13 DIAGNOSIS — K2901 Acute gastritis with bleeding: Secondary | ICD-10-CM | POA: Diagnosis not present

## 2022-06-13 DIAGNOSIS — R569 Unspecified convulsions: Secondary | ICD-10-CM | POA: Diagnosis not present

## 2022-06-13 DIAGNOSIS — F5101 Primary insomnia: Secondary | ICD-10-CM | POA: Diagnosis not present

## 2022-06-13 DIAGNOSIS — R7401 Elevation of levels of liver transaminase levels: Secondary | ICD-10-CM | POA: Diagnosis not present

## 2022-06-14 ENCOUNTER — Ambulatory Visit: Payer: Medicare Other | Admitting: Occupational Therapy

## 2022-06-15 NOTE — Patient Outreach (Signed)
ADONAI HELZER 1950-10-12 [id number]   [facility] [ACO] Patient:  Medicare ACO REACH  PCP: Charlane Ferretti, MD, [place] [facility]  Referral request:  [facility] Readmission Report  Patient's electronic medical record was access for review of readmission and for post hospital transitional needs.  Patient's provider is listed with [facility] and patient is for outpatient rehab noted.  Plan:  Information request report sent  Natividad Brood, RN BSN [place]  [phone] business mobile phone Toll free office [phone]  *[place]  [phone] Fax number: [phone] [email or web address]

## 2022-07-06 ENCOUNTER — Ambulatory Visit (INDEPENDENT_AMBULATORY_CARE_PROVIDER_SITE_OTHER): Payer: Medicare Other | Admitting: Internal Medicine

## 2022-07-06 ENCOUNTER — Encounter: Payer: Self-pay | Admitting: Internal Medicine

## 2022-07-06 VITALS — BP 110/72 | HR 53 | Ht 64.0 in | Wt 157.0 lb

## 2022-07-06 DIAGNOSIS — K922 Gastrointestinal hemorrhage, unspecified: Secondary | ICD-10-CM

## 2022-07-06 DIAGNOSIS — K2971 Gastritis, unspecified, with bleeding: Secondary | ICD-10-CM | POA: Diagnosis not present

## 2022-07-06 NOTE — Patient Instructions (Signed)
_______________________________________________________  If you are age 70 or older, your body mass index should be between 23-30. Your Body mass index is 26.95 kg/m. If this is out of the aforementioned range listed, please consider follow up with your Primary Care Provider.  If you are age 49 or younger, your body mass index should be between 19-25. Your Body mass index is 26.95 kg/m. If this is out of the aformentioned range listed, please consider follow up with your Primary Care Provider.   ________________________________________________________  The [facility] providers would like to encourage you to use [facility] to communicate with providers for non-urgent requests or questions.  Due to long hold times on the telephone, sending your provider a message by [facility] may be a faster and more efficient way to get a response.  Please allow 48 business hours for a response.  Please remember that this is for non-urgent requests.  _______________________________________________________  Please follow up as needed

## 2022-07-06 NOTE — Progress Notes (Signed)
HISTORY OF PRESENT ILLNESS:  Brittany Washington is a 71 y.o. female, longstanding Economist at [employer], who presents today after recent hospitalization for upper GI bleeding.  She is accompanied today by her husband, Dr. Pearlie Oyster.  Patient does have a history of upper GI bleeding in July 2020 when she was found to have gastritis after NSAID use.  She had been on regular PPI thereafter until she was advised by her PCP to discontinue.  She did well until June 04, 2022 when she presented to the emergency room with 5 episodes of abrupt hematemesis.  There was no associated abdominal pain.  She denied using NSAIDs.  She does report drinking a couple glasses of wine on a daily basis.  Her admission hemoglobin was 13.7.  She subsequently underwent upper endoscopy with Dr. Fuller Plan on June 05, 2022.  She was found to have hemorrhagic gastritis which was treated with APC.  The exam was otherwise normal.  Biopsies were taken and did not show any evidence of Helicobacter pylori.  She was placed on pantoprazole 40 mg twice daily and told to follow-up at this time.  During the hospital stay her hemoglobin was as low as 11.3.  Her hemoglobin June 08, 2022 was stable at 11.4.  She reports that she is doing well.  She is taking pantoprazole twice daily.  Confirm that she is not using NSAIDs.  GI review of systems is entirely negative.  She remains quite active and is going to the [facility] later today with her husband for their routine exercise.  REVIEW OF SYSTEMS:  All non-GI ROS negative unless otherwise stated in the HPI.  Past Medical History:  Diagnosis Date   Benign hypertensive heart disease without heart failure    GERD (gastroesophageal reflux disease)    Heart murmur, systolic    MECHNICAL   Hypertension    mild, lost weight, no meds now   Hypothyroidism    Osteoporosis    Primary osteoarthritis of right hip 06/18/2018    Past Surgical History:  Procedure Laterality Date    APPENDECTOMY     BACK SURGERY     BIOPSY  03/10/2019   Procedure: BIOPSY;  Surgeon: Milus Banister, MD;  Location: [facility] ENDOSCOPY;  Service: Endoscopy;;   BIOPSY  06/05/2022   Procedure: BIOPSY;  Surgeon: Ladene Artist, MD;  Location: [facility] ENDOSCOPY;  Service: Gastroenterology;;   COLONOSCOPY     ESOPHAGOGASTRODUODENOSCOPY N/A 03/10/2019   Procedure: ESOPHAGOGASTRODUODENOSCOPY (EGD);  Surgeon: Milus Banister, MD;  Location: [facility] ENDOSCOPY;  Service: Endoscopy;  Laterality: N/A;   ESOPHAGOGASTRODUODENOSCOPY (EGD) WITH PROPOFOL N/A 06/05/2022   Procedure: ESOPHAGOGASTRODUODENOSCOPY (EGD) WITH PROPOFOL;  Surgeon: Ladene Artist, MD;  Location: [facility] ENDOSCOPY;  Service: Gastroenterology;  Laterality: N/A;   FRACTURE SURGERY     HAMMER TOE SURGERY Left 07/14/2013   Procedure: HAMMER TOE CORRECTION LEFT SECOND TOE, AUSTIN/AKIN LEFT;  Surgeon: [name], DPM;  Location: [place];  Service: Podiatry;  Laterality: Left;   HOT HEMOSTASIS N/A 06/05/2022   Procedure: HOT HEMOSTASIS (ARGON PLASMA COAGULATION/BICAP);  Surgeon: Ladene Artist, MD;  Location: [facility];  Service: Gastroenterology;  Laterality: N/A;   JOINT REPLACEMENT     LAPAROSCOPIC APPENDECTOMY N/A 12/29/2016   Procedure: APPENDECTOMY LAPAROSCOPIC;  Surgeon: Excell Seltzer, MD;  Location: [facility] ORS;  Service: General;  Laterality: N/A;   [place] DISKECTOMY W/ DISSECTION, LEFT L4 -- L5  02-23-2004   METATARSAL OSTEOTOMY Left 04/02/2015  Procedure: LEFT FOOT METATARSAL OSTEOTOMY;  Surgeon: [name], MD;  Location: [place];  Service: Orthopedics;  Laterality: Left;   METATARSAL OSTEOTOMY Left 08/18/2016   Procedure: METATARSAL OSTEOTOMY SECOND TOE;  Surgeon: [name], MD;  Location: [place];  Service: Orthopedics;  Laterality: Left;   ORIF LEFT DISTAL RADIAL FX W/ BONE GRAFT  01-14-2009   POSTERIOR LUMBAR FUSION  05-16-2005   L4 --  L5   RE-DO LUMBAR DISKECTOMY , L4  - L5  05-03-2004   REVERSE SHOULDER ARTHROPLASTY Right 05/23/2021   Procedure: REVERSE SHOULDER ARTHROPLASTY;  Surgeon: Hiram Gash, MD;  Location: [place];  Service: Orthopedics;  Laterality: Right;   TOTAL HIP ARTHROPLASTY Right 06/18/2018   TOTAL HIP ARTHROPLASTY Right 06/18/2018   Procedure: RIGHT TOTAL HIP ARTHROPLASTY ANTERIOR APPROACH;  Surgeon: [name], MD;  Location: [place];  Service: Orthopedics;  Laterality: Right;   TRIGGER FINGER RELEASE  01/09/2012   Procedure: RELEASE TRIGGER FINGER/A-1 PULLEY;  Surgeon: [name], MD;  Location: [place];  Service: Orthopedics;  Laterality: Left;   VAGINAL HYSTERECTOMY  1993   W/ BILATERAL SALPINGOOPHORECTOMY   WRIST FRACTURE SURGERY Left     Social History Brittany Washington  reports that she has never smoked. She has never used smokeless tobacco. She reports current alcohol use of about 7.0 standard drinks of alcohol per week. She reports that she does not use drugs.  family history includes Colon cancer in her father; Heart attack in her brother and father; Ovarian cancer in her mother.  Allergies  Allergen Reactions   Sulfa Antibiotics Diarrhea and Other (See Comments)    Body aches    Fosamax [Alendronate] Other (See Comments)    Heartburn, myalgias   Nsaids Other (See Comments)    History of GI bleeding       PHYSICAL EXAMINATION: Vital signs: BP 110/72   Pulse (!) 53   Ht _0  (1.626 m)   Wt 157 lb (71.2 kg)   BMI 26.95 kg/m   Constitutional: generally well-appearing, no acute distress Psychiatric: alert and oriented x3, cooperative Eyes: extraocular movements intact, anicteric, conjunctiva pink Mouth: oral pharynx moist, no lesions Neck: supple no lymphadenopathy Cardiovascular: heart regular rate and rhythm, no murmur Lungs: clear to auscultation bilaterally Abdomen: soft, nontender, nondistended, no obvious ascites, no  peritoneal signs, normal bowel sounds, no organomegaly Rectal: Omitted Extremities: no lower extremity edema bilaterally Skin: no lesions on visible extremities Neuro: No focal deficits.  Cranial nerves intact  ASSESSMENT:  1.  Recent hospitalization with acute upper GI bleeding secondary to hemorrhagic gastritis.  Required endoscopic hemostatic therapy.  Biopsies negative for Helicobacter pylori or other significant pathology.  Now on PPI. 2.  History of adenomatous colon polyps.  Also family history of colon cancer in her father greater than age 2.  Last colonoscopy June 2022 with diminutive adenoma.  Follow-up in 7 years recommended   PLAN:  1.  Okay to decrease pantoprazole to 40 mg once daily.  However, stay on this indefinitely given her history of recurrent hemorrhagic gastritis 2.  Avoid NSAIDs 3.  Limit alcohol consumption 4.  Surveillance colonoscopy around June 2029 5.  Interval GI follow-up as needed 6.  Ongoing general medical care with Dr. Louis Matte A total time of 25 minutes was spent preparing to see the patient, reviewing the myriad of hospital records, laboratories, pathology, x-rays, and endoscopy report.  Obtaining comprehensive history, performing medically appropriate physical examination,  counseling and educating the patient and her husband regarding the above listed issues, directing medical therapy, and documenting clinical information in the health record

## 2022-07-25 DIAGNOSIS — R300 Dysuria: Secondary | ICD-10-CM | POA: Diagnosis not present

## 2022-07-25 DIAGNOSIS — E039 Hypothyroidism, unspecified: Secondary | ICD-10-CM | POA: Diagnosis not present

## 2022-09-26 ENCOUNTER — Encounter: Payer: Self-pay | Admitting: Cardiovascular Disease

## 2022-09-26 NOTE — Progress Notes (Signed)
Cardiology Office Note:    Date:  09/27/2022   ID:  Brittany Washington, DOB Aug 23, 1951, MRN [id number]  PCP:  Charlane Ferretti, MD   [facility] Providers Cardiologist:   Royann Wildasin    Referring MD: Charlane Ferretti, MD   Chief Complaint  Patient presents with   Mitral Regurgitation      Dec. 7, 2022   Brittany Washington is a 72 y.o. female with a hx of hypertension, hypothyroidism, heart murmur, hyperlipidemia.  I last saw her in May, 2017.  She returns today to reestablish care.  Broke her R shoulder in Sept. - fell while carrying potted plants , also had an orbital fracture, and hairline fracture in her left elbow .   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   September 27, 2022: Brittany Washington is seen today for follow-up of her hypertension, hypothyroidism, mitral regurgitation.  She has a pectus excavatum.  Had another GI bleed.  She was found to have a small ulcer at endoscopy.  Still exercising .   Wt is 168.     Past Medical History:  Diagnosis Date   Benign hypertensive heart disease without heart failure    GERD (gastroesophageal reflux disease)    Heart murmur, systolic    MECHNICAL   Hypertension    mild, lost weight, no meds now   Hypothyroidism    Osteoporosis    Primary osteoarthritis of right hip 06/18/2018    Past Surgical History:  Procedure Laterality Date   APPENDECTOMY     BACK SURGERY     BIOPSY  03/10/2019   Procedure: BIOPSY;  Surgeon: Milus Banister, MD;  Location: [facility] ENDOSCOPY;  Service: Endoscopy;;   BIOPSY  06/05/2022   Procedure: BIOPSY;  Surgeon: Ladene Artist, MD;  Location: [facility] ENDOSCOPY;  Service: Gastroenterology;;   COLONOSCOPY     ESOPHAGOGASTRODUODENOSCOPY N/A 03/10/2019   Procedure: ESOPHAGOGASTRODUODENOSCOPY (EGD);  Surgeon: Milus Banister, MD;  Location: [facility] ENDOSCOPY;  Service: Endoscopy;  Laterality: N/A;    ESOPHAGOGASTRODUODENOSCOPY (EGD) WITH PROPOFOL N/A 06/05/2022   Procedure: ESOPHAGOGASTRODUODENOSCOPY (EGD) WITH PROPOFOL;  Surgeon: Ladene Artist, MD;  Location: [facility] ENDOSCOPY;  Service: Gastroenterology;  Laterality: N/A;   FRACTURE SURGERY     HAMMER TOE SURGERY Left 07/14/2013   Procedure: HAMMER TOE CORRECTION LEFT SECOND TOE, AUSTIN/AKIN LEFT;  Surgeon: [name], DPM;  Location: [place];  Service: Podiatry;  Laterality: Left;   HOT HEMOSTASIS N/A 06/05/2022   Procedure: HOT HEMOSTASIS (ARGON PLASMA COAGULATION/BICAP);  Surgeon: Ladene Artist, MD;  Location: [facility] ENDOSCOPY;  Service: Gastroenterology;  Laterality: N/A;   JOINT REPLACEMENT     LAPAROSCOPIC APPENDECTOMY N/A 12/29/2016   Procedure: APPENDECTOMY LAPAROSCOPIC;  Surgeon: Excell Seltzer, MD;  Location: [facility] ORS;  Service: General;  Laterality: N/A;   [place] DISKECTOMY W/ DISSECTION, LEFT L4 -- L5  02-23-2004   METATARSAL OSTEOTOMY Left 04/02/2015   Procedure: LEFT FOOT METATARSAL OSTEOTOMY;  Surgeon: [name], MD;  Location: [place];  Service: Orthopedics;  Laterality: Left;   METATARSAL OSTEOTOMY Left 08/18/2016   Procedure: METATARSAL OSTEOTOMY SECOND TOE;  Surgeon: [name], MD;  Location: [place];  Service: Orthopedics;  Laterality: Left;   ORIF LEFT DISTAL RADIAL FX W/ BONE GRAFT  01-14-2009   POSTERIOR LUMBAR FUSION  05-16-2005  L4 -- L5   RE-DO LUMBAR DISKECTOMY , L4  - L5  05-03-2004   REVERSE SHOULDER ARTHROPLASTY Right 05/23/2021   Procedure: REVERSE SHOULDER ARTHROPLASTY;  Surgeon: Hiram Gash, MD;  Location: [place];  Service: Orthopedics;  Laterality: Right;   TOTAL HIP ARTHROPLASTY Right 06/18/2018   TOTAL HIP ARTHROPLASTY Right 06/18/2018   Procedure: RIGHT TOTAL HIP ARTHROPLASTY ANTERIOR APPROACH;  Surgeon: [name], MD;  Location: [place];  Service: Orthopedics;  Laterality:  Right;   TRIGGER FINGER RELEASE  01/09/2012   Procedure: RELEASE TRIGGER FINGER/A-1 PULLEY;  Surgeon: [name], MD;  Location: [place];  Service: Orthopedics;  Laterality: Left;   VAGINAL HYSTERECTOMY  1993   W/ BILATERAL SALPINGOOPHORECTOMY   WRIST FRACTURE SURGERY Left     Current Medications: Current Meds  Medication Sig   cetirizine (ZYRTEC) 10 MG tablet Take 10 mg by mouth at bedtime.    Cholecalciferol (VITAMIN D3) 400 units CAPS Take 400 Units by mouth daily.   clonazePAM (KLONOPIN) 1 MG tablet Take 1 tablet (1 mg total) by mouth at bedtime. Take 1-2 tablets by mouth daily at bedtime as needed for sleep.   cyanocobalamin 1000 MCG tablet Take 1 tablet (1,000 mcg total) by mouth daily.   denosumab (PROLIA) 60 MG/ML SOSY injection Inject 60 mg into the skin every 6 (six) months.   diphenhydrAMINE (BENADRYL) 25 MG tablet Take 25 mg by mouth at bedtime as needed for allergies.   diphenoxylate-atropine (LOMOTIL) 2.5-0.025 MG tablet Take 1 tablet by mouth 4 (four) times daily as needed for diarrhea or loose stools.   hydrochlorothiazide (MICROZIDE) 12.5 MG capsule TAKE 1 CAPSULE BY MOUTH DAILY.   levothyroxine (SYNTHROID) 125 MCG tablet Take 1 tablet (125 mcg total) by mouth daily.   methocarbamol (ROBAXIN) 500 MG tablet Take 500 mg by mouth 2 (two) times daily as needed for muscle spasms.   metoprolol succinate (TOPROL-XL) 25 MG 24 hr tablet Take 12.5 mg by mouth 2 (two) times daily.   ondansetron (ZOFRAN-ODT) 4 MG disintegrating tablet Take 4 mg by mouth every 6 (six) hours as needed for nausea or vomiting.   pantoprazole (PROTONIX) 40 MG tablet Take 1 tablet (40 mg total) by mouth 2 (two) times daily.   thiamine (VITAMIN B-1) 100 MG tablet Take 1 tablet (100 mg total) by mouth daily.   Turmeric 500 MG CAPS Take 500 mg by mouth daily with breakfast.   TYLENOL 500 MG tablet Take 500-1,000 mg by mouth every 8 (eight) hours as needed (for mild pain).   valsartan  (DIOVAN) 160 MG tablet Take 160 mg by mouth daily.     Allergies:   Sulfa antibiotics, Fosamax [alendronate], and Nsaids   Social History   Socioeconomic History   Marital status: Married    Spouse name: Not on file   Number of children: 1   Years of education: 14   Highest education level: Not on file  Occupational History   Occupation: RN  Tobacco Use   Smoking status: Never   Smokeless tobacco: Never  Vaping Use   Vaping Use: Never used  Substance and Sexual Activity   Alcohol use: Yes    Alcohol/week: 7.0 standard drinks of alcohol    Types: 7 Glasses of wine per week    Comment: 1 WINE DAILY   Drug use: No   Sexual activity: Yes  Other Topics Concern   Not on file  Social History Narrative   Patient drinks  about 2 cups of caffeine daily.   Patient is right handed.    Social Determinants of Health   Financial Resource Strain: Not on file  Food Insecurity: No Food Insecurity (06/07/2022)   Hunger Vital Sign    Worried About Running Out of Food in the Last Year: Never true    Ran Out of Food in the Last Year: Never true  Transportation Needs: No Transportation Needs (06/07/2022)   PRAPARE - Hydrologist (Medical): No    Lack of Transportation (Non-Medical): No  Physical Activity: Not on file  Stress: Not on file  Social Connections: Not on file     Family History: The patient's family history includes Colon cancer in her father; Heart attack in her brother and father; Ovarian cancer in her mother. There is no history of Rectal cancer, Stomach cancer, or Breast cancer.  ROS:   Please see the history of present illness.     All other systems reviewed and are negative.  EKGs/Labs/Other Studies Reviewed:    The following studies were reviewed today:   EKG:    Recent Labs: 06/05/2022: TSH 11.839 06/07/2022: ALT 53 06/08/2022: BUN <5; Creatinine, Ser 1.10; Hemoglobin 11.4; Magnesium 2.0; Platelets 128; Potassium 3.7; Sodium 135   Recent Lipid Panel    Component Value Date/Time   CHOL 211 (H) 01/24/2016 0937   TRIG 199 (H) 01/24/2016 0937   HDL 76 01/24/2016 0937   CHOLHDL 2.8 01/24/2016 0937   VLDL 40 (H) 01/24/2016 0937   LDLCALC 95 01/24/2016 0937   LDLDIRECT 78.9 01/21/2013 0822     Risk Assessment/Calculations:           Physical Exam:    Physical Exam: Blood pressure 122/76, pulse 62, height '5\' 4"'$  (1.626 m), weight 168 lb 12.8 oz (76.6 kg), SpO2 95 %.       GEN:  Well nourished, well developed in no acute distress HEENT: Normal NECK: No JVD; No carotid bruits LYMPHATICS: No lymphadenopathy CARDIAC: RRR  soft systolic murmur , pectus excavatum  RESPIRATORY:  Clear to auscultation without rales, wheezing or rhonchi  ABDOMEN: Soft, non-tender, non-distended MUSCULOSKELETAL:  No edema; No deformity  SKIN: Warm and dry NEUROLOGIC:  Alert and oriented x 3    ASSESSMENT:    1. Nonrheumatic mitral valve regurgitation     PLAN:       Mitral regurgitation :   Beronica appears to be doing well.  She has a very soft systolic murmur.  I do not think her mitral regurgitation has changed significantly.  I will see her again in 1 year.  It may make sense to repeat her echo and 2026 but for now she is clinically very stable.  2.  Shoulder replacement:         Medication Adjustments/Labs and Tests Ordered: Current medicines are reviewed at length with the patient today.  Concerns regarding medicines are outlined above.  No orders of the defined types were placed in this encounter.   No orders of the defined types were placed in this encounter.    Patient Instructions  Medication Instructions:  Your physician recommends that you continue on your current medications as directed. Please refer to the Current Medication list given to you today.  *If you need a refill on your cardiac medications before your next appointment, please call your pharmacy*   Lab Work: NONE If you have labs  (blood work) drawn today and your tests are completely normal, you will receive  your results only by: [place] (if you have MyChart) OR A paper copy in the mail If you have any lab test that is abnormal or we need to change your treatment, we will call you to review the results.   Testing/Procedures: NONE   Follow-Up: At [facility], you and your health needs are our priority.  As part of our continuing mission to provide you with exceptional heart care, we have created designated Provider Care Teams.  These Care Teams include your primary Cardiologist (physician) and Advanced Practice Providers (APPs -  Physician Assistants and Nurse Practitioners) who all work together to provide you with the care you need, when you need it.  We recommend signing up for the patient portal called "MyChart".  Sign up information is provided on this After Visit Summary.  MyChart is used to connect with patients for Virtual Visits (Telemedicine).  Patients are able to view lab/test results, encounter notes, upcoming appointments, etc.  Non-urgent messages can be sent to your provider as well.   To learn more about what you can do with MyChart, go to [email or web address].    Your next appointment:   1 year(s)  Provider:   Mertie Moores, MD      Signed, Mertie Moores, MD  09/27/2022 5:46 PM    [place]

## 2022-09-27 ENCOUNTER — Ambulatory Visit: Payer: Medicare Other | Attending: Cardiovascular Disease | Admitting: Cardiovascular Disease

## 2022-09-27 ENCOUNTER — Encounter: Payer: Self-pay | Admitting: Cardiovascular Disease

## 2022-09-27 VITALS — BP 122/76 | HR 62 | Ht 64.0 in | Wt 168.8 lb

## 2022-09-27 DIAGNOSIS — I340 Nonrheumatic mitral (valve) insufficiency: Secondary | ICD-10-CM | POA: Diagnosis not present

## 2022-09-27 NOTE — Patient Instructions (Signed)
Medication Instructions:  Your physician recommends that you continue on your current medications as directed. Please refer to the Current Medication list given to you today.  *If you need a refill on your cardiac medications before your next appointment, please call your pharmacy*   Lab Work: NONE If you have labs (blood work) drawn today and your tests are completely normal, you will receive your results only by: MyChart Message (if you have MyChart) OR A paper copy in the mail If you have any lab test that is abnormal or we need to change your treatment, we will call you to review the results.   Testing/Procedures: NONE   Follow-Up: At [facility], you and your health needs are our priority.  As part of our continuing mission to provide you with exceptional heart care, we have created designated Provider Care Teams.  These Care Teams include your primary Cardiologist (physician) and Advanced Practice Providers (APPs -  Physician Assistants and Nurse Practitioners) who all work together to provide you with the care you need, when you need it.  We recommend signing up for the patient portal called "MyChart".  Sign up information is provided on this After Visit Summary.  MyChart is used to connect with patients for Virtual Visits (Telemedicine).  Patients are able to view lab/test results, encounter notes, upcoming appointments, etc.  Non-urgent messages can be sent to your provider as well.   To learn more about what you can do with MyChart, go to [email or web address].    Your next appointment:   1 year(s)  Provider:   Philip Nahser, MD   

## 2022-10-17 ENCOUNTER — Other Ambulatory Visit: Payer: Self-pay | Admitting: Internal Medicine

## 2022-10-17 DIAGNOSIS — Z1231 Encounter for screening mammogram for malignant neoplasm of breast: Secondary | ICD-10-CM

## 2022-10-23 DIAGNOSIS — I10 Essential (primary) hypertension: Secondary | ICD-10-CM | POA: Diagnosis not present

## 2022-10-23 DIAGNOSIS — M810 Age-related osteoporosis without current pathological fracture: Secondary | ICD-10-CM | POA: Diagnosis not present

## 2022-10-23 DIAGNOSIS — Z79899 Other long term (current) drug therapy: Secondary | ICD-10-CM | POA: Diagnosis not present

## 2022-10-23 DIAGNOSIS — E039 Hypothyroidism, unspecified: Secondary | ICD-10-CM | POA: Diagnosis not present

## 2022-10-23 DIAGNOSIS — E785 Hyperlipidemia, unspecified: Secondary | ICD-10-CM | POA: Diagnosis not present

## 2022-10-25 DIAGNOSIS — Z0000 Encounter for general adult medical examination without abnormal findings: Secondary | ICD-10-CM | POA: Diagnosis not present

## 2022-10-25 DIAGNOSIS — I10 Essential (primary) hypertension: Secondary | ICD-10-CM | POA: Diagnosis not present

## 2022-10-25 DIAGNOSIS — E039 Hypothyroidism, unspecified: Secondary | ICD-10-CM | POA: Diagnosis not present

## 2022-10-25 DIAGNOSIS — E785 Hyperlipidemia, unspecified: Secondary | ICD-10-CM | POA: Diagnosis not present

## 2022-10-25 DIAGNOSIS — M810 Age-related osteoporosis without current pathological fracture: Secondary | ICD-10-CM | POA: Diagnosis not present

## 2022-10-25 DIAGNOSIS — Z8739 Personal history of other diseases of the musculoskeletal system and connective tissue: Secondary | ICD-10-CM | POA: Diagnosis not present

## 2022-10-25 DIAGNOSIS — E669 Obesity, unspecified: Secondary | ICD-10-CM | POA: Diagnosis not present

## 2022-10-25 DIAGNOSIS — Z79899 Other long term (current) drug therapy: Secondary | ICD-10-CM | POA: Diagnosis not present

## 2022-10-26 ENCOUNTER — Ambulatory Visit: Payer: Medicare Other | Admitting: Neurology

## 2022-11-07 ENCOUNTER — Ambulatory Visit
Admission: RE | Admit: 2022-11-07 | Discharge: 2022-11-07 | Disposition: A | Discharge status: Home / Self Care | Payer: Medicare Other | Source: Ambulatory Visit | Attending: Orthopaedic Surgery | Admitting: Orthopaedic Surgery

## 2022-11-07 ENCOUNTER — Other Ambulatory Visit: Payer: Self-pay | Admitting: Orthopaedic Surgery

## 2022-11-07 DIAGNOSIS — S42121A Displaced fracture of acromial process, right shoulder, initial encounter for closed fracture: Secondary | ICD-10-CM

## 2022-11-07 DIAGNOSIS — S42201D Unspecified fracture of upper end of right humerus, subsequent encounter for fracture with routine healing: Secondary | ICD-10-CM | POA: Diagnosis not present

## 2022-11-07 DIAGNOSIS — M25511 Pain in right shoulder: Secondary | ICD-10-CM | POA: Diagnosis not present

## 2022-11-09 DIAGNOSIS — H903 Sensorineural hearing loss, bilateral: Secondary | ICD-10-CM | POA: Diagnosis not present

## 2022-11-09 DIAGNOSIS — H838X3 Other specified diseases of inner ear, bilateral: Secondary | ICD-10-CM | POA: Diagnosis not present

## 2022-11-09 DIAGNOSIS — H6123 Impacted cerumen, bilateral: Secondary | ICD-10-CM | POA: Diagnosis not present

## 2022-11-10 ENCOUNTER — Other Ambulatory Visit: Payer: Self-pay

## 2022-11-10 ENCOUNTER — Encounter (HOSPITAL_BASED_OUTPATIENT_CLINIC_OR_DEPARTMENT_OTHER): Payer: Self-pay | Admitting: Orthopaedic Surgery

## 2022-11-14 ENCOUNTER — Encounter (HOSPITAL_BASED_OUTPATIENT_CLINIC_OR_DEPARTMENT_OTHER)
Admission: RE | Admit: 2022-11-14 | Discharge: 2022-11-14 | Disposition: A | Discharge status: Home / Self Care | Payer: Medicare Other | Source: Ambulatory Visit | Attending: Orthopaedic Surgery | Admitting: Orthopaedic Surgery

## 2022-11-14 DIAGNOSIS — S42201D Unspecified fracture of upper end of right humerus, subsequent encounter for fracture with routine healing: Secondary | ICD-10-CM | POA: Diagnosis not present

## 2022-11-14 DIAGNOSIS — Z01812 Encounter for preprocedural laboratory examination: Secondary | ICD-10-CM | POA: Diagnosis not present

## 2022-11-14 LAB — BASIC METABOLIC PANEL
Anion gap: 9 (ref 5–15)
BUN: 15 mg/dL (ref 8–23)
CO2: 25 mmol/L (ref 22–32)
Calcium: 9.7 mg/dL (ref 8.9–10.3)
Chloride: 95 mmol/L — ABNORMAL LOW (ref 98–111)
Creatinine, Ser: 0.99 mg/dL (ref 0.44–1.00)
GFR, Estimated: >60 mL/min (ref >60)
Glucose, Bld: 99 mg/dL (ref 70–99)
Potassium: 4.5 mmol/L (ref 3.5–5.1)
Sodium: 129 mmol/L — ABNORMAL LOW (ref 135–145)

## 2022-11-14 NOTE — Anesthesia Preprocedure Evaluation (Signed)
Anesthesia Evaluation  Patient identified by MRN, date of birth, ID band Patient awake    Reviewed: Allergy & Precautions, NPO status , Patient's Chart, lab work & pertinent test results  Airway Mallampati: III  TM Distance: >3 FB Neck ROM: Full    Dental no notable dental hx.    Pulmonary neg pulmonary ROS   Pulmonary exam normal        Cardiovascular hypertension, Pt. on medications and Pt. on home beta blockers Normal cardiovascular exam     Neuro/Psych  Headaches, Seizures -, Well Controlled,   negative psych ROS   GI/Hepatic Neg liver ROS,GERD  Medicated and Controlled,,  Endo/Other  Hypothyroidism    Renal/GU Renal disease     Musculoskeletal  (+) Arthritis ,    Abdominal   Peds  Hematology negative hematology ROS (+)   Anesthesia Other Findings Right scapular fracture  Reproductive/Obstetrics                             Anesthesia Physical Anesthesia Plan  ASA: 2  Anesthesia Plan: General   Post-op Pain Management:    Induction: Intravenous  PONV Risk Score and Plan: 3 and Ondansetron, Dexamethasone and Treatment may vary due to age or medical condition  Airway Management Planned: Oral ETT  Additional Equipment:   Intra-op Plan:   Post-operative Plan: Extubation in OR  Informed Consent: I have reviewed the patients History and Physical, chart, labs and discussed the procedure including the risks, benefits and alternatives for the proposed anesthesia with the patient or authorized representative who has indicated his/her understanding and acceptance.     Dental advisory given  Plan Discussed with: CRNA  Anesthesia Plan Comments:        Anesthesia Quick Evaluation

## 2022-11-14 NOTE — H&P (Signed)
PREOPERATIVE H&P  Chief Complaint: right scapular fracture  HPI: Brittany Washington is a 72 y.o. female who is scheduled for, Procedure(s): OPEN REDUCTION INTERNAL FIXATION (ORIF) SHOULDER FRACTURE.   Patient has a past medical history significant for GERD, HTN, hypothyroidism.   The patient is a 72 year old female who is well known to [place].  She had a right reverse total shoulder arthroplasty in September 2022.  She did great with this.  She had a fall on Sunday, 11/04/2022 when she tripped going up the stairs and she hit her shoulder.  She has had more pain in the back of her shoulder and limitations with range of motion.  Symptoms are rated as moderate to severe, and have been worsening.  This is significantly impairing activities of daily living.    Please see clinic note for further details on this patient's care.    She has elected for surgical management.   Past Medical History:  Diagnosis Date   Benign hypertensive heart disease without heart failure    GERD (gastroesophageal reflux disease)    Heart murmur, systolic    MECHNICAL   Hypertension    mild, lost weight, no meds now   Hypothyroidism    Osteoporosis    Primary osteoarthritis of right hip 06/18/2018   Seizure [facility])    Past Surgical History:  Procedure Laterality Date   APPENDECTOMY     BACK SURGERY     BIOPSY  03/10/2019   Procedure: BIOPSY;  Surgeon: Milus Banister, MD;  Location: [facility] ENDOSCOPY;  Service: Endoscopy;;   BIOPSY  06/05/2022   Procedure: BIOPSY;  Surgeon: Ladene Artist, MD;  Location: [facility] ENDOSCOPY;  Service: Gastroenterology;;   COLONOSCOPY     ESOPHAGOGASTRODUODENOSCOPY N/A 03/10/2019   Procedure: ESOPHAGOGASTRODUODENOSCOPY (EGD);  Surgeon: Milus Banister, MD;  Location: [facility] ENDOSCOPY;  Service: Endoscopy;  Laterality: N/A;   ESOPHAGOGASTRODUODENOSCOPY (EGD) WITH PROPOFOL N/A 06/05/2022   Procedure: ESOPHAGOGASTRODUODENOSCOPY (EGD) WITH PROPOFOL;  Surgeon: Ladene Artist, MD;  Location:  [facility] ENDOSCOPY;  Service: Gastroenterology;  Laterality: N/A;   FRACTURE SURGERY     HAMMER TOE SURGERY Left 07/14/2013   Procedure: HAMMER TOE CORRECTION LEFT SECOND TOE, AUSTIN/AKIN LEFT;  Surgeon: [name], DPM;  Location: [place];  Service: Podiatry;  Laterality: Left;   HOT HEMOSTASIS N/A 06/05/2022   Procedure: HOT HEMOSTASIS (ARGON PLASMA COAGULATION/BICAP);  Surgeon: Ladene Artist, MD;  Location: [facility] ENDOSCOPY;  Service: Gastroenterology;  Laterality: N/A;   JOINT REPLACEMENT     LAPAROSCOPIC APPENDECTOMY N/A 12/29/2016   Procedure: APPENDECTOMY LAPAROSCOPIC;  Surgeon: Excell Seltzer, MD;  Location: [facility] ORS;  Service: General;  Laterality: N/A;   [place] DISKECTOMY W/ DISSECTION, LEFT L4 -- L5  02-23-2004   METATARSAL OSTEOTOMY Left 04/02/2015   Procedure: LEFT FOOT METATARSAL OSTEOTOMY;  Surgeon: [name], MD;  Location: [place];  Service: Orthopedics;  Laterality: Left;   METATARSAL OSTEOTOMY Left 08/18/2016   Procedure: METATARSAL OSTEOTOMY SECOND TOE;  Surgeon: [name], MD;  Location: [place];  Service: Orthopedics;  Laterality: Left;   ORIF LEFT DISTAL RADIAL FX W/ BONE GRAFT  01-14-2009   POSTERIOR LUMBAR FUSION  05-16-2005   L4 -- L5   RE-DO LUMBAR DISKECTOMY , L4  - L5  05-03-2004   REVERSE SHOULDER ARTHROPLASTY Right 05/23/2021   Procedure: REVERSE SHOULDER ARTHROPLASTY;  Surgeon: Hiram Gash, MD;  Location: [place];  Service: Orthopedics;  Laterality: Right;   TOTAL HIP ARTHROPLASTY Right 06/18/2018   TOTAL HIP ARTHROPLASTY Right 06/18/2018   Procedure: RIGHT TOTAL HIP ARTHROPLASTY ANTERIOR APPROACH;  Surgeon: [name], MD;  Location: [place];  Service: Orthopedics;  Laterality: Right;   TRIGGER FINGER RELEASE  01/09/2012   Procedure: RELEASE TRIGGER FINGER/A-1 PULLEY;  Surgeon: [name], MD;  Location: [place];   Service: Orthopedics;  Laterality: Left;   VAGINAL HYSTERECTOMY  1993   W/ BILATERAL SALPINGOOPHORECTOMY   WRIST FRACTURE SURGERY Left    Social History   Socioeconomic History   Marital status: Married    Spouse name: Not on file   Number of children: 1   Years of education: 14   Highest education level: Not on file  Occupational History   Occupation: RN  Tobacco Use   Smoking status: Never   Smokeless tobacco: Never  Vaping Use   Vaping Use: Never used  Substance and Sexual Activity   Alcohol use: Yes    Alcohol/week: 7.0 standard drinks of alcohol    Types: 7 Glasses of wine per week    Comment: 1 WINE DAILY   Drug use: No   Sexual activity: Yes  Other Topics Concern   Not on file  Social History Narrative   Patient drinks about 2 cups of caffeine daily.   Patient is right handed.    Social Determinants of Health   Financial Resource Strain: Not on file  Food Insecurity: No Food Insecurity (06/07/2022)   Hunger Vital Sign    Worried About Running Out of Food in the Last Year: Never true    Ran Out of Food in the Last Year: Never true  Transportation Needs: No Transportation Needs (06/07/2022)   PRAPARE - Hydrologist (Medical): No    Lack of Transportation (Non-Medical): No  Physical Activity: Not on file  Stress: Not on file  Social Connections: Not on file   Family History  Problem Relation Age of Onset   Heart attack Father    Colon cancer Father    Heart attack Brother    Ovarian cancer Mother    Rectal cancer Neg Hx    Stomach cancer Neg Hx    Breast cancer Neg Hx    Allergies  Allergen Reactions   Sulfa Antibiotics Diarrhea and Other (See Comments)    Body aches    Fosamax [Alendronate] Other (See Comments)    Heartburn, myalgias   Nsaids Other (See Comments)    History of GI bleeding   Prior to Admission medications   Medication Sig Start Date End Date Taking? Authorizing Provider  cetirizine (ZYRTEC) 10 MG  tablet Take 10 mg by mouth at bedtime.    Yes [provider]  Cholecalciferol (VITAMIN D3) 400 units CAPS Take 400 Units by mouth daily.   Yes [provider]  clonazePAM (KLONOPIN) 1 MG tablet Take 1 tablet (1 mg total) by mouth at bedtime. Take 1-2 tablets by mouth daily at bedtime as needed for sleep. 12/20/15  Yes Nahser, Wonda Cheng, MD  cyanocobalamin 1000 MCG tablet Take 1 tablet (1,000 mcg total) by mouth daily. 06/06/22  Yes Bonnielee Haff, MD  diphenhydrAMINE (BENADRYL) 25 MG tablet Take 25 mg by mouth at bedtime as needed for allergies.   Yes [provider]  hydrochlorothiazide (MICROZIDE) 12.5 MG capsule TAKE 1 CAPSULE BY MOUTH DAILY. 09/21/15  Yes Darlin Coco, MD  levothyroxine (SYNTHROID) 125 MCG tablet Take 1 tablet (  125 mcg total) by mouth daily. Patient taking differently: Take 137 mcg by mouth daily. 06/06/22  Yes Bonnielee Haff, MD  methocarbamol (ROBAXIN) 500 MG tablet Take 500 mg by mouth 2 (two) times daily as needed for muscle spasms.   Yes [provider]  metoprolol succinate (TOPROL-XL) 25 MG 24 hr tablet Take 12.5 mg by mouth 2 (two) times daily. 09/08/22  Yes [provider]  pantoprazole (PROTONIX) 40 MG tablet Take 1 tablet (40 mg total) by mouth 2 (two) times daily. 06/06/22  Yes Bonnielee Haff, MD  thiamine (VITAMIN B-1) 100 MG tablet Take 1 tablet (100 mg total) by mouth daily. 06/06/22  Yes Bonnielee Haff, MD  Turmeric 500 MG CAPS Take 500 mg by mouth daily with breakfast.   Yes [provider]  TYLENOL 500 MG tablet Take 500-1,000 mg by mouth every 8 (eight) hours as needed (for mild pain).   Yes [provider]  valsartan (DIOVAN) 160 MG tablet Take 160 mg by mouth daily. 09/26/18  Yes [provider]  denosumab (PROLIA) 60 MG/ML SOSY injection Inject 60 mg into the skin every 6 (six) months.    [provider]  diphenoxylate-atropine (LOMOTIL) 2.5-0.025 MG tablet Take 1 tablet by  mouth 4 (four) times daily as needed for diarrhea or loose stools.    [provider]  ondansetron (ZOFRAN-ODT) 4 MG disintegrating tablet Take 4 mg by mouth every 6 (six) hours as needed for nausea or vomiting. 03/30/21   [provider]    ROS: All other systems have been reviewed and were otherwise negative with the exception of those mentioned in the HPI and as above.  Physical Exam: General: Alert, no acute distress Cardiovascular: No pedal edema Respiratory: No cyanosis, no use of accessory musculature GI: No organomegaly, abdomen is soft and non-tender Skin: No lesions in the area of chief complaint Neurologic: Sensation intact distally Psychiatric: Patient is competent for consent with normal mood and affect Lymphatic: No axillary or cervical lymphadenopathy  MUSCULOSKELETAL:  Incision is benign.  Active forward elevation to 60 degrees.  External rotation to 20 degrees.  Strength not tested.  Distal motor and sensory function intact.  She is TTP acromion.   Imaging: Three views of the right shoulder demonstrate a displaced acromion fracture.  Hardware is in place without any hardware complicating features.   CT demonstrates acute mildly displaced fracture of the scapular spine. Hardware in place without any hardware complicating features.  Assessment: right scapular fracture  Plan: Plan for Procedure(s): OPEN REDUCTION INTERNAL FIXATION (ORIF) SHOULDER FRACTURE   The risks benefits and alternatives were discussed with the patient including but not limited to the risks of nonoperative treatment, versus surgical intervention including infection, bleeding, nerve injury,  blood clots, cardiopulmonary complications, morbidity, mortality, among others, and they were willing to proceed.   The patient acknowledged the explanation, agreed to proceed with the plan and consent was signed.   Operative Plan: ORIF right acromion Discharge Medications: standard DVT  Prophylaxis: none Physical Therapy: delayed - outpatient PT Special Discharge needs: sling   Ethelda Chick, PA-C  11/14/2022 12:18 PM

## 2022-11-15 NOTE — Progress Notes (Signed)
Reviewed blood work with Dr. Smith Robert at [facility]. ([address])  Called and instructed patient to cut back on her water intake the remainder of today, hold her dose of HCTZ tonight and tomorrow (she already took her morning dose today) and she will arrive for surgery at 0730 for a repeat BMP for a sodium recheck per Dr. Smith Robert. Patient verbalized understanding.

## 2022-11-15 NOTE — Discharge Instructions (Addendum)
Post Anesthesia Home Care Instructions  Activity: Get plenty of rest for the remainder of the day. A responsible individual must stay with you for 24 hours following the procedure.  For the next 24 hours, DO NOT: -Drive a car -Paediatric nurse -Drink alcoholic beverages -Take any medication unless instructed by your physician -Make any legal decisions or sign important papers.  Meals: Start with liquid foods such as gelatin or soup. Progress to regular foods as tolerated. Avoid greasy, spicy, heavy foods. If nausea and/or vomiting occur, drink only clear liquids until the nausea and/or vomiting subsides. Call your physician if vomiting continues.  Special Instructions/Symptoms: Your throat may feel dry or sore from the anesthesia or the breathing tube placed in your throat during surgery. If this causes discomfort, gargle with warm salt water. The discomfort should disappear within 24 hours.  If you had a scopolamine patch placed behind your ear for the management of post- operative nausea and/or vomiting:  1. The medication in the patch is effective for 72 hours, after which it should be removed.  Wrap patch in a tissue and discard in the trash. Wash hands thoroughly with soap and water. 2. You may remove the patch earlier than 72 hours if you experience unpleasant side effects which may include dry mouth, dizziness or visual disturbances. 3. Avoid touching the patch. Wash your hands with soap and water after contact with the patch.    Next dose of Tylenol may be taken at 4P  Next doe of ibuprofen may be taken at 2p   Brittany Charter MD, MPH Noemi Chapel, PA-C [name]. [address], [address] [phone] (tel)   [phone] (fax)   [place] may leave the operative dressing in place until your follow-up appointment. KEEP THE INCISIONS CLEAN AND DRY. There may be a small amount of fluid/bleeding leaking at the  surgical site. This is normal after surgery.  If it fills with liquid or blood please call us immediately to change it for you. Use the provided ice machine or Ice packs as often as possible for the first 3-4 days, then as needed for pain relief.   Keep a layer of cloth or a shirt between your skin and the cooling unit to prevent frost bite as it can get very cold.  SHOWERING: - You may shower on Post-Op Day #2.  - The dressing is water resistant but do not scrub it as it may start to peel up.   - You may remove the sling for showering - Gently pat the area dry.  - Do not soak the shoulder in water.  - Do not go swimming in the pool or ocean until your incision has completely healed (about 4-6 weeks after surgery) - KEEP THE INCISIONS CLEAN AND DRY.  EXERCISES Wear the sling at all times  You may remove the sling for showering, but keep the arm across the chest or in a secondary sling.      It is normal for your fingers/hand to become more swollen after surgery and discolored from bruising.   This will resolve over the first few weeks usually after surgery. Please continue to ambulate and do not stay sitting or lying for too long.  Perform foot and wrist pumps to assist in circulation.   REGIONAL ANESTHESIA (NERVE BLOCKS) The anesthesia team may have performed a nerve block for you this is a great tool used to minimize pain.   The block may  start wearing off overnight (between 8-24 hours postop) When the block wears off, your pain may go from nearly zero to the pain you would have had postop without the block. This is an abrupt transition but nothing dangerous is happening.   This can be a challenging period but utilize your as needed pain medications to try and manage this period. We suggest you use the pain medication the first night prior to going to bed, to ease this transition.  You may take an extra dose of narcotic when this happens if needed   POST-OP MEDICATIONS-  Multimodal approach to pain control In general your pain will be controlled with a combination of substances.  Prescriptions unless otherwise discussed are electronically sent to your pharmacy.  This is a carefully made plan we use to minimize narcotic use.      Acetaminophen - Non-narcotic pain medicine taken on a scheduled basis  Oxycodone - This is a strong narcotic, to be used only on an "as needed" basis for SEVERE pain. Zofran -  take as needed for nausea   FOLLOW-UP If you develop a Fever (>101.5), Redness or Drainage from the surgical incision site, please call our office to arrange for an evaluation. Please call the office to schedule a follow-up appointment for a wound check, 7-10 days post-operatively.  IF YOU HAVE ANY QUESTIONS, PLEASE FEEL FREE TO CALL OUR OFFICE.  HELPFUL INFORMATION  Your arm will be in a sling following surgery. You will be in this sling for the next 6 weeks.   You may be more comfortable sleeping in a semi-seated position the first few nights following surgery.  Keep a pillow propped under the elbow and forearm for comfort.  If you have a recliner type of chair it might be beneficial.  If not that is fine too, but it would be helpful to sleep propped up with pillows behind your operated shoulder as well under your elbow and forearm.  This will reduce pulling on the suture lines.  When dressing, put your operative arm in the sleeve first.  When getting undressed, take your operative arm out last.  Loose fitting, button-down shirts are recommended.  In most states it is against the law to drive while your arm is in a sling. And certainly against the law to drive while taking narcotics.  You may return to work/school in the next couple of days when you feel up to it. Desk work and typing in the sling is fine.  We suggest you use the pain medication the first night prior to going to bed, in order to ease any pain when the anesthesia wears off. You should avoid  taking pain medications on an empty stomach as it will make you nauseous.  You should wean off your narcotic medicines as soon as you are able.     Most patients will be off or using minimal narcotics before their first postop appointment.   Do not drink alcoholic beverages or take illicit drugs when taking pain medications.  Pain medication may make you constipated.  Below are a few solutions to try in this order: Decrease the amount of pain medication if you aren't having pain. Drink lots of decaffeinated fluids. Drink prune juice and/or each dried prunes  If the first 3 don't work start with additional solutions Take Colace - an over-the-counter stool softener Take Senokot - an over-the-counter laxative Take Miralax - a stronger over-the-counter laxative     For more information including helpful videos  and documents visit our website:   [email or web address]

## 2022-11-16 ENCOUNTER — Ambulatory Visit (HOSPITAL_COMMUNITY): Payer: Medicare Other

## 2022-11-16 ENCOUNTER — Encounter (HOSPITAL_BASED_OUTPATIENT_CLINIC_OR_DEPARTMENT_OTHER)
Admission: RE | Disposition: A | Discharge status: Home / Self Care | Payer: Self-pay | Source: Ambulatory Visit | Attending: Orthopaedic Surgery

## 2022-11-16 ENCOUNTER — Ambulatory Visit (HOSPITAL_BASED_OUTPATIENT_CLINIC_OR_DEPARTMENT_OTHER)
Admission: RE | Admit: 2022-11-16 | Discharge: 2022-11-16 | Disposition: A | Discharge status: Home / Self Care | Payer: Medicare Other | Source: Ambulatory Visit | Attending: Orthopaedic Surgery | Admitting: Orthopaedic Surgery

## 2022-11-16 ENCOUNTER — Ambulatory Visit (HOSPITAL_BASED_OUTPATIENT_CLINIC_OR_DEPARTMENT_OTHER): Payer: Medicare Other | Admitting: Certified Registered"

## 2022-11-16 ENCOUNTER — Encounter (HOSPITAL_BASED_OUTPATIENT_CLINIC_OR_DEPARTMENT_OTHER): Payer: Self-pay | Admitting: Orthopaedic Surgery

## 2022-11-16 ENCOUNTER — Other Ambulatory Visit: Payer: Self-pay

## 2022-11-16 DIAGNOSIS — Z79899 Other long term (current) drug therapy: Secondary | ICD-10-CM | POA: Diagnosis not present

## 2022-11-16 DIAGNOSIS — Z96611 Presence of right artificial shoulder joint: Secondary | ICD-10-CM | POA: Insufficient documentation

## 2022-11-16 DIAGNOSIS — K219 Gastro-esophageal reflux disease without esophagitis: Secondary | ICD-10-CM | POA: Diagnosis not present

## 2022-11-16 DIAGNOSIS — Z79890 Hormone replacement therapy (postmenopausal): Secondary | ICD-10-CM | POA: Insufficient documentation

## 2022-11-16 DIAGNOSIS — E039 Hypothyroidism, unspecified: Secondary | ICD-10-CM | POA: Diagnosis not present

## 2022-11-16 DIAGNOSIS — I10 Essential (primary) hypertension: Secondary | ICD-10-CM | POA: Diagnosis not present

## 2022-11-16 DIAGNOSIS — Z01818 Encounter for other preprocedural examination: Secondary | ICD-10-CM

## 2022-11-16 DIAGNOSIS — W19XXXA Unspecified fall, initial encounter: Secondary | ICD-10-CM | POA: Insufficient documentation

## 2022-11-16 DIAGNOSIS — G40909 Epilepsy, unspecified, not intractable, without status epilepticus: Secondary | ICD-10-CM | POA: Diagnosis not present

## 2022-11-16 DIAGNOSIS — S42101A Fracture of unspecified part of scapula, right shoulder, initial encounter for closed fracture: Secondary | ICD-10-CM

## 2022-11-16 DIAGNOSIS — S42191A Fracture of other part of scapula, right shoulder, initial encounter for closed fracture: Secondary | ICD-10-CM | POA: Insufficient documentation

## 2022-11-16 DIAGNOSIS — Z471 Aftercare following joint replacement surgery: Secondary | ICD-10-CM | POA: Diagnosis not present

## 2022-11-16 DIAGNOSIS — M1611 Unilateral primary osteoarthritis, right hip: Secondary | ICD-10-CM | POA: Insufficient documentation

## 2022-11-16 DIAGNOSIS — S42111A Displaced fracture of body of scapula, right shoulder, initial encounter for closed fracture: Secondary | ICD-10-CM | POA: Diagnosis not present

## 2022-11-16 HISTORY — PX: ORIF SHOULDER FRACTURE: SHX5035

## 2022-11-16 HISTORY — DX: Unspecified convulsions: R56.9

## 2022-11-16 LAB — BASIC METABOLIC PANEL
Anion gap: 9 (ref 5–15)
BUN: 20 mg/dL (ref 8–23)
CO2: 25 mmol/L (ref 22–32)
Calcium: 9.8 mg/dL (ref 8.9–10.3)
Chloride: 97 mmol/L — ABNORMAL LOW (ref 98–111)
Creatinine, Ser: 1.14 mg/dL — ABNORMAL HIGH (ref 0.44–1.00)
GFR, Estimated: 52 mL/min — ABNORMAL LOW (ref >60)
Glucose, Bld: 104 mg/dL — ABNORMAL HIGH (ref 70–99)
Potassium: 4.6 mmol/L (ref 3.5–5.1)
Sodium: 131 mmol/L — ABNORMAL LOW (ref 135–145)

## 2022-11-16 SURGERY — OPEN REDUCTION INTERNAL FIXATION (ORIF) SHOULDER FRACTURE
Anesthesia: General | Site: Shoulder | Laterality: Right

## 2022-11-16 MED ORDER — BUPIVACAINE HCL (PF) 0.25 % IJ SOLN
INTRAMUSCULAR | Status: DC | PRN
  Administered 2022-11-16: 30 mL

## 2022-11-16 MED ORDER — ONDANSETRON HCL 4 MG/2ML IJ SOLN
INTRAMUSCULAR | Status: DC | PRN
  Administered 2022-11-16: 4 mg via INTRAVENOUS

## 2022-11-16 MED ORDER — LACTATED RINGERS IV SOLN: INTRAVENOUS | Status: DC

## 2022-11-16 MED ORDER — ONDANSETRON HCL 4 MG PO TABS: 4.0000 mg | ORAL_TABLET | Freq: Three times a day (TID) | ORAL | 0 refills | Status: AC | PRN

## 2022-11-16 MED ORDER — ACETAMINOPHEN 500 MG PO TABS: 1000.0000 mg | ORAL_TABLET | Freq: Three times a day (TID) | ORAL | 0 refills | Status: AC

## 2022-11-16 MED ORDER — 0.9 % SODIUM CHLORIDE (POUR BTL) OPTIME
TOPICAL | Status: DC | PRN
  Administered 2022-11-16: 500 mL

## 2022-11-16 MED ORDER — PROPOFOL 500 MG/50ML IV EMUL
INTRAVENOUS | Status: AC
  Filled 2022-11-16: qty 100

## 2022-11-16 MED ORDER — ROCURONIUM BROMIDE 100 MG/10ML IV SOLN
INTRAVENOUS | Status: DC | PRN
  Administered 2022-11-16: 50 mg via INTRAVENOUS

## 2022-11-16 MED ORDER — AMISULPRIDE (ANTIEMETIC) 5 MG/2ML IV SOLN: 10.0000 mg | Freq: Once | INTRAVENOUS | Status: DC | PRN

## 2022-11-16 MED ORDER — CEFAZOLIN SODIUM-DEXTROSE 2-4 GM/100ML-% IV SOLN
2.0000 g | INTRAVENOUS | Status: AC
  Administered 2022-11-16: 2 g via INTRAVENOUS

## 2022-11-16 MED ORDER — ONDANSETRON HCL 4 MG/2ML IJ SOLN
INTRAMUSCULAR | Status: AC
  Filled 2022-11-16: qty 2

## 2022-11-16 MED ORDER — LIDOCAINE HCL (CARDIAC) PF 100 MG/5ML IV SOSY
PREFILLED_SYRINGE | INTRAVENOUS | Status: DC | PRN
  Administered 2022-11-16: 30 mg via INTRAVENOUS

## 2022-11-16 MED ORDER — GABAPENTIN 300 MG PO CAPS
300.0000 mg | ORAL_CAPSULE | Freq: Once | ORAL | Status: AC
  Administered 2022-11-16: 300 mg via ORAL

## 2022-11-16 MED ORDER — PROPOFOL 10 MG/ML IV BOLUS
INTRAVENOUS | Status: DC | PRN
  Administered 2022-11-16: 100 mg via INTRAVENOUS

## 2022-11-16 MED ORDER — LIDOCAINE 2% (20 MG/ML) 5 ML SYRINGE
INTRAMUSCULAR | Status: AC
  Filled 2022-11-16: qty 20

## 2022-11-16 MED ORDER — VANCOMYCIN HCL 1000 MG IV SOLR
INTRAVENOUS | Status: DC | PRN
  Administered 2022-11-16: 1000 mg via TOPICAL

## 2022-11-16 MED ORDER — ROCURONIUM BROMIDE 10 MG/ML (PF) SYRINGE
PREFILLED_SYRINGE | INTRAVENOUS | Status: AC
  Filled 2022-11-16: qty 20

## 2022-11-16 MED ORDER — TRANEXAMIC ACID-NACL 1000-0.7 MG/100ML-% IV SOLN
INTRAVENOUS | Status: DC | PRN
  Administered 2022-11-16: 1000 mg via INTRAVENOUS

## 2022-11-16 MED ORDER — PHENYLEPHRINE HCL (PRESSORS) 10 MG/ML IV SOLN
INTRAVENOUS | Status: DC | PRN
  Administered 2022-11-16: 10 ug via INTRAVENOUS

## 2022-11-16 MED ORDER — ONDANSETRON HCL 4 MG/2ML IJ SOLN: 4.0000 mg | Freq: Once | INTRAMUSCULAR | Status: DC | PRN

## 2022-11-16 MED ORDER — BUPIVACAINE HCL (PF) 0.25 % IJ SOLN
INTRAMUSCULAR | Status: AC
  Filled 2022-11-16: qty 30

## 2022-11-16 MED ORDER — OXYCODONE HCL 5 MG PO TABS: ORAL_TABLET | ORAL | 0 refills | Status: AC

## 2022-11-16 MED ORDER — MIDAZOLAM HCL 2 MG/2ML IJ SOLN
INTRAMUSCULAR | Status: AC
  Filled 2022-11-16: qty 2

## 2022-11-16 MED ORDER — FENTANYL CITRATE (PF) 100 MCG/2ML IJ SOLN
25.0000 ug | INTRAMUSCULAR | Status: DC | PRN
  Administered 2022-11-16: 50 ug via INTRAVENOUS

## 2022-11-16 MED ORDER — ACETAMINOPHEN 500 MG PO TABS
1000.0000 mg | ORAL_TABLET | Freq: Once | ORAL | Status: AC
  Administered 2022-11-16: 1000 mg via ORAL

## 2022-11-16 MED ORDER — FENTANYL CITRATE (PF) 100 MCG/2ML IJ SOLN
INTRAMUSCULAR | Status: DC | PRN
  Administered 2022-11-16: 50 ug via INTRAVENOUS

## 2022-11-16 MED ORDER — ACETAMINOPHEN 10 MG/ML IV SOLN: 1000.0000 mg | Freq: Once | INTRAVENOUS | Status: DC | PRN

## 2022-11-16 MED ORDER — SUCCINYLCHOLINE CHLORIDE 200 MG/10ML IV SOSY
PREFILLED_SYRINGE | INTRAVENOUS | Status: AC
  Filled 2022-11-16: qty 10

## 2022-11-16 MED ORDER — TRANEXAMIC ACID-NACL 1000-0.7 MG/100ML-% IV SOLN
INTRAVENOUS | Status: AC
  Filled 2022-11-16: qty 100

## 2022-11-16 MED ORDER — EPHEDRINE SULFATE (PRESSORS) 50 MG/ML IJ SOLN
INTRAMUSCULAR | Status: DC | PRN
  Administered 2022-11-16: 10 mg via INTRAVENOUS
  Administered 2022-11-16: 20 mg via INTRAVENOUS
  Administered 2022-11-16 (×2): 5 mg via INTRAVENOUS

## 2022-11-16 MED ORDER — GABAPENTIN 300 MG PO CAPS
ORAL_CAPSULE | ORAL | Status: AC
  Filled 2022-11-16: qty 1

## 2022-11-16 MED ORDER — CEFAZOLIN SODIUM-DEXTROSE 2-4 GM/100ML-% IV SOLN
INTRAVENOUS | Status: AC
  Filled 2022-11-16: qty 100

## 2022-11-16 MED ORDER — OXYCODONE HCL 5 MG PO TABS
ORAL_TABLET | ORAL | Status: AC
  Filled 2022-11-16: qty 1

## 2022-11-16 MED ORDER — FENTANYL CITRATE (PF) 100 MCG/2ML IJ SOLN
INTRAMUSCULAR | Status: AC
  Filled 2022-11-16: qty 2

## 2022-11-16 MED ORDER — SUGAMMADEX SODIUM 200 MG/2ML IV SOLN
INTRAVENOUS | Status: DC | PRN
  Administered 2022-11-16: 200 mg via INTRAVENOUS

## 2022-11-16 MED ORDER — ACETAMINOPHEN 500 MG PO TABS
ORAL_TABLET | ORAL | Status: AC
  Filled 2022-11-16: qty 2

## 2022-11-16 MED ORDER — DEXAMETHASONE SODIUM PHOSPHATE 10 MG/ML IJ SOLN
INTRAMUSCULAR | Status: AC
  Filled 2022-11-16: qty 2

## 2022-11-16 MED ORDER — ONDANSETRON HCL 4 MG/2ML IJ SOLN
INTRAMUSCULAR | Status: AC
  Filled 2022-11-16: qty 16

## 2022-11-16 MED ORDER — DEXAMETHASONE SODIUM PHOSPHATE 4 MG/ML IJ SOLN
INTRAMUSCULAR | Status: DC | PRN
  Administered 2022-11-16: 4 mg via INTRAVENOUS

## 2022-11-16 SURGICAL SUPPLY — 69 items
AID PSTN UNV HD RSTRNT DISP (MISCELLANEOUS) ×1
APL PRP STRL LF DISP 70% ISPRP (MISCELLANEOUS) ×1
BIT DRILL 110X30X2XCALB (BIT) IMPLANT
BIT DRILL QC 2.0X100 (BIT) ×1
BIT DRL 110X30X2XCALB (BIT) ×1
BLADE HEX COATED 2.75 (ELECTRODE) IMPLANT
BLADE SURG 10 STRL SS (BLADE) ×1 IMPLANT
BLADE SURG 15 STRL LF DISP TIS (BLADE) ×1 IMPLANT
BLADE SURG 15 STRL SS (BLADE) ×1
BNDG CMPR 5X4 CHSV STRCH STRL (GAUZE/BANDAGES/DRESSINGS)
BNDG COHESIVE 4X5 TAN STRL LF (GAUZE/BANDAGES/DRESSINGS) IMPLANT
CHLORAPREP W/TINT 26 (MISCELLANEOUS) ×1 IMPLANT
CLSR STERI-STRIP ANTIMIC 1/2X4 (GAUZE/BANDAGES/DRESSINGS) ×1 IMPLANT
COOLER ICEMAN CLASSIC (MISCELLANEOUS) ×1 IMPLANT
DRAPE C-ARM 42X72 X-RAY (DRAPES) ×1 IMPLANT
DRAPE IMP U-DRAPE 54X76 (DRAPES) ×1 IMPLANT
DRAPE INCISE IOBAN 66X45 STRL (DRAPES) ×1 IMPLANT
DRAPE POUCH INSTRU U-SHP 10X18 (DRAPES) ×1 IMPLANT
DRAPE U-SHAPE 76X120 STRL (DRAPES) ×2 IMPLANT
DRSG AQUACEL AG ADV 3.5X 6 (GAUZE/BANDAGES/DRESSINGS) ×1 IMPLANT
ELECT REM PT RETURN 9FT ADLT (ELECTROSURGICAL) ×1
ELECTRODE REM PT RTRN 9FT ADLT (ELECTROSURGICAL) ×1 IMPLANT
GLOVE BIO SURGEON STRL SZ 6.5 (GLOVE) ×1 IMPLANT
GLOVE BIO SURGEON STRL SZ8 (GLOVE) IMPLANT
GLOVE BIOGEL PI IND STRL 6.5 (GLOVE) ×1 IMPLANT
GLOVE BIOGEL PI IND STRL 8 (GLOVE) ×1 IMPLANT
GLOVE ECLIPSE 8.0 STRL XLNG CF (GLOVE) ×1 IMPLANT
GOWN STRL REUS W/ TWL LRG LVL3 (GOWN DISPOSABLE) ×1 IMPLANT
GOWN STRL REUS W/TWL LRG LVL3 (GOWN DISPOSABLE) ×1
GOWN STRL REUS W/TWL XL LVL3 (GOWN DISPOSABLE) ×1 IMPLANT
KIT STABILIZATION SHOULDER (MISCELLANEOUS) ×1 IMPLANT
PACK ARTHROSCOPY DSU (CUSTOM PROCEDURE TRAY) ×1 IMPLANT
PACK BASIN DAY SURGERY FS (CUSTOM PROCEDURE TRAY) ×1 IMPLANT
PAD COLD SHLDR WRAP-ON (PAD) ×1 IMPLANT
PENCIL SMOKE EVACUATOR (MISCELLANEOUS) ×1 IMPLANT
PLATE CLAV MED/RT 2.7 VA (Plate) IMPLANT
PLATE LCP 5H 38MM 2.0MM (Plate) IMPLANT
PUTTY DBM STAGRAFT 2CC (Putty) IMPLANT
RESTRAINT HEAD UNIVERSAL NS (MISCELLANEOUS) ×1 IMPLANT
SCREW CORTEX 2.7 SLF-TPNG 16MM (Screw) IMPLANT
SCREW CORTEX 2.7 SLF-TPNG 18MM (Screw) IMPLANT
SCREW CORTEX 2.7X36 ST T8 (Screw) IMPLANT
SCREW LOCK T8 22X2.7XST VA (Screw) IMPLANT
SCREW LOCK VA ST 2.7X14 (Screw) IMPLANT
SCREW LOCKING 2.7X16MM VA (Screw) IMPLANT
SCREW LOCKING 2.7X22MM (Screw) ×1 IMPLANT
SCREW SELF TAP 12M (Screw) IMPLANT
SCREW VA LOCKING 2.7X36 (Screw) ×1 IMPLANT
SCREW VA LOCKING 2.7X36 VA (Screw) IMPLANT
SHEET MEDIUM DRAPE 40X70 STRL (DRAPES) ×1 IMPLANT
SLEEVE SCD COMPRESS KNEE MED (STOCKING) ×1 IMPLANT
SLING ARM FOAM STRAP LRG (SOFTGOODS) IMPLANT
SPIKE FLUID TRANSFER (MISCELLANEOUS) IMPLANT
SPONGE T-LAP 18X18 ~~LOC~~+RFID (SPONGE) ×1 IMPLANT
SPONGE T-LAP 4X18 ~~LOC~~+RFID (SPONGE) IMPLANT
SUCTION FRAZIER HANDLE 10FR (MISCELLANEOUS) ×1
SUCTION TUBE FRAZIER 10FR DISP (MISCELLANEOUS) ×1 IMPLANT
SUT ETHILON 3 0 PS 1 (SUTURE) IMPLANT
SUT MNCRL AB 4-0 PS2 18 (SUTURE) IMPLANT
SUT VIC AB 0 CT1 18XCR BRD 8 (SUTURE) ×1 IMPLANT
SUT VIC AB 0 CT1 27 (SUTURE)
SUT VIC AB 0 CT1 27XCR 8 STRN (SUTURE) IMPLANT
SUT VIC AB 0 CT1 8-18 (SUTURE) ×1
SUT VIC AB 3-0 SH 27 (SUTURE) ×1
SUT VIC AB 3-0 SH 27X BRD (SUTURE) ×1 IMPLANT
SUT VICRYL 0 SH 27 (SUTURE) IMPLANT
SYR BULB EAR ULCER 3OZ GRN STR (SYRINGE) ×1 IMPLANT
TOWEL GREEN STERILE FF (TOWEL DISPOSABLE) ×1 IMPLANT
TUBE SUCTION HIGH CAP CLEAR NV (SUCTIONS) ×1 IMPLANT

## 2022-11-16 NOTE — Anesthesia Postprocedure Evaluation (Signed)
Anesthesia Post Note  Patient: Brittany Washington  Procedure(s) Performed: OPEN REDUCTION INTERNAL FIXATION (ORIF) SHOULDER FRACTURE (Right: Shoulder)     Patient location during evaluation: PACU Anesthesia Type: General Level of consciousness: awake Pain management: pain level controlled Vital Signs Assessment: post-procedure vital signs reviewed and stable Respiratory status: spontaneous breathing, nonlabored ventilation and respiratory function stable Cardiovascular status: blood pressure returned to baseline and stable Postop Assessment: no apparent nausea or vomiting Anesthetic complications: no   No notable events documented.  Last Vitals:  Vitals:   11/16/22 1322 11/16/22 1330  BP:  (!) 146/83  Pulse: 71 88  Resp: 19 18  Temp:  (!) 36.3 C  SpO2: 95% 95%    Last Pain:  Vitals:   11/16/22 1330  TempSrc:   PainSc: 3                  Kenza Munar P Fain Francis

## 2022-11-16 NOTE — Anesthesia Procedure Notes (Signed)
Procedure Name: Intubation Date/Time: 11/16/2022 10:38 AM  Performed by: Tyeson Tanimoto, Ernesta Amble, CRNAPre-anesthesia Checklist: Patient identified, Emergency Drugs available, Suction available and Patient being monitored Patient Re-evaluated:Patient Re-evaluated prior to induction Oxygen Delivery Method: Circle system utilized Preoxygenation: Pre-oxygenation with 100% oxygen Induction Type: IV induction Ventilation: Mask ventilation without difficulty Laryngoscope Size: Mac and 3 Grade View: Grade I Tube type: Oral Tube size: 7.0 mm Number of attempts: 1 Airway Equipment and Method: Stylet and Oral airway Placement Confirmation: ETT inserted through vocal cords under direct vision, positive ETCO2 and breath sounds checked- equal and bilateral Secured at: 21 cm Tube secured with: Tape Dental Injury: Teeth and Oropharynx as per pre-operative assessment

## 2022-11-16 NOTE — Interval H&P Note (Signed)
All questions answered, patient wants to proceed with procedure. ? ?

## 2022-11-16 NOTE — Transfer of Care (Signed)
Immediate Anesthesia Transfer of Care Note  Patient: Brittany Washington  Procedure(s) Performed: OPEN REDUCTION INTERNAL FIXATION (ORIF) SHOULDER FRACTURE (Right: Shoulder)  Patient Location: PACU  Anesthesia Type:General  Level of Consciousness: drowsy and patient cooperative  Airway & Oxygen Therapy: Patient Spontanous Breathing and Patient connected to face mask oxygen  Post-op Assessment: Report given to RN and Post -op Vital signs reviewed and stable  Post vital signs: Reviewed and stable  Last Vitals:  Vitals Value Taken Time  BP    Temp    Pulse    Resp    SpO2      Last Pain:  Vitals:   11/16/22 0758  TempSrc: Oral  PainSc: 3       Patients Stated Pain Goal: 5 ([id number])  Complications: No notable events documented.

## 2022-11-17 ENCOUNTER — Encounter (HOSPITAL_BASED_OUTPATIENT_CLINIC_OR_DEPARTMENT_OTHER): Payer: Self-pay | Admitting: Orthopaedic Surgery

## 2022-11-18 NOTE — Op Note (Signed)
Orthopaedic Surgery Operative Note (CSN: [id number])  Brittany Washington  03-Sep-1951 Date of Surgery: 11/16/2022   Diagnoses:  Right scapular spine fracture  Procedure: Right scapula open external fixation with dual plates   Operative Finding Successful completion of the planned procedure.  Patient had reasonable bone quality and reasonable tissue quality.  The fracture was at the flare of the scapular spine near the base however it was in the transition to the acromion itself rather than in the normal spine region.  This meant that it was a very small piece of bone.  We augmented with allograft DBM putty to try and assist in healing and improve the biologic environment.  We were careful not to over strip the area.  We used a custom contoured clavicle plate from [employer] and a secondary 2.7 plate to augment our fixation.  We closed the skin with nylon in the setting of questionable integrity of the soft tissue envelope typically.  Ideally the patient would stay in an abduction sling for 6 weeks to avoid tension on her deltoid.  Will start therapy after that.  Post-operative plan: The patient will be nonweightbearing in a sling for 6 weeks.  The patient will be discharged home.  DVT prophylaxis Aspirin 81 mg twice daily for 6 weeks.   Pain control with PRN pain medication preferring oral medicines.  Follow up plan will be scheduled in approximately 7 days for incision check and XR.  Post-Op Diagnosis: Same Surgeons:Primary: Hiram Gash, MD Assistants:Caroline McBane PA-C Location: [facility] OR ROOM 6 Anesthesia: General with local anesthesia Antibiotics: Ancef 2 g with local vancomycin powder 1 g at the surgical site Tourniquet time:  Estimated Blood Loss: Minimal Complications: None Specimens: None Implants: Implant Name Type Inv. Item Serial No. Manufacturer Lot No. LRB No. Used Action  PUTTY DBM STAGRAFT 2CC - [id number] Putty PUTTY DBM STAGRAFT 2CC  [employer](ORTH,TRAU,BIO,SG) [id number]  Right 1 Implanted  PLATE LCP 5H 579FGE 2.0MM - [id number] Plate PLATE LCP 5H 579FGE [id number]  [employer]  Right 1 Implanted  SCREW SELF TAP 73M - [id number] Screw SCREW SELF TAP 73M  [employer]PU[employer]TH[employer]  Right 1 Implanted  SCREW CORTEX 2.7 SLF-TPNG 18MM - [id number] Screw SCREW CORTEX 2.7 SLF-TPNG 18MM  [employer]TH[employer]  Right 3 Implanted  PLATE CLAV MED/RT 2.7 VA - [id number] Plate PLATE CLAV MED/RT 2.7 VA  [employer]  Right 1 Implanted  SCREW CORTEX 2.7 SLF-TPNG 16MM - [id number] Screw SCREW CORTEX 2.7 SLF-TPNG 16MM  [employer]TH[employer]  Right 1 Implanted  SCREW LOCKING 2.7X22MM - [id number] Screw SCREW LOCKING 2.7X22MM  [employer]PU[employer]TH[employer] ON STERRILE TRAY Right 1 Implanted  SCREW VA LOCKING 2.7X36 - [id number] Screw SCREW VA LOCKING 2.7X36  DEPUY ORTHOPAED[employer] ON STERILE TRAY Right 1 Implanted  SCREW LOCKING 2.7X16MM VA - [id number] Screw SCREW LOCKING 2.7X16MM VA  DEPUY ORTHOPAEDICS ON STERILE TRAY Right 2 Implanted  SCREW LOCK VA ST 2.7X14 - [id number] Screw SCREW LOCK VA ST 2.7X14  DEPUY ORTHOPAED[employer] ON STERILE TRAY Right 4 Implanted    Indications for Surgery:   Brittany Washington is a 72 y.o. 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  Benefits and risks of operative and nonoperative management were discussed prior to surgery with patient/guardian(s) and informed consent form was completed.  Specific risks including infection, need for additional surgery, nonunion, malunion, hardware complication and loss of reduction amongst others.  Procedure:   The patient was identified properly. Informed consent was obtained and the surgical site was marked. The patient was taken up to suite where general anesthesia was induced.  The patient was positioned beachchair on [employer] table.  The right shoulder was prepped and draped in the usual sterile fashion.   Timeout was performed before the beginning of the case.  We began with a superficial dissection of the scapular spine.  Went through skin sharply carefully dissecting and obtaining hemostasis as we progressed.  Made full-thickness skin flaps and opened the fascia in a slightly different line then the skin incision.  At this point were able to identify the fracture site.  We carefully performed a limited periosteal dissection to expose the fracture site.  Hematoma was cleared.  We then used fluoroscopy to help with our guidance and choosing our plate.  We found that a [employer] medial plate actually obtained reasonable contour for the nature of her fracture.  We custom bent the plate to fit more appropriately taking care to under reduce the tilt of the acromion to avoid excess tension on her construct.  We then were able to get multiple locking and nonlocking screws medial to the fracture and about 5-6 lateral screws.  These were all locking.  We had good fixation and anatomic reduction.  We felt that in the setting of this poor biologic environment and relatively small bone that dual plating was appropriate and used a 5 hole 2 7 [employer] locking plate and obtained multiple locking screws placed in the plate on the posterior aspect of the acromion.  Final construct had anatomic reduction and no hardware complications.  We irrigated copiously placed local vancomycin powder and closed incision in multilayer fashion finishing with nylon suture.  Sterile dressing and abduction pillow sling was placed.  Patient awoken taken to PACU in stable condition.  Noemi Chapel, PA-C, present and scrubbed throughout the case, critical for completion in a timely fashion, and for retraction, instrumentation, closure.

## 2022-11-23 DIAGNOSIS — S42111A Displaced fracture of body of scapula, right shoulder, initial encounter for closed fracture: Secondary | ICD-10-CM | POA: Diagnosis not present

## 2022-11-30 ENCOUNTER — Ambulatory Visit: Payer: Medicare Other

## 2022-12-04 DIAGNOSIS — M810 Age-related osteoporosis without current pathological fracture: Secondary | ICD-10-CM | POA: Diagnosis not present

## 2022-12-04 DIAGNOSIS — E039 Hypothyroidism, unspecified: Secondary | ICD-10-CM | POA: Diagnosis not present

## 2022-12-22 DIAGNOSIS — H00015 Hordeolum externum left lower eyelid: Secondary | ICD-10-CM | POA: Diagnosis not present

## 2022-12-29 DIAGNOSIS — S42111D Displaced fracture of body of scapula, right shoulder, subsequent encounter for fracture with routine healing: Secondary | ICD-10-CM | POA: Diagnosis not present

## 2023-01-01 ENCOUNTER — Ambulatory Visit
Admission: RE | Admit: 2023-01-01 | Discharge: 2023-01-01 | Disposition: A | Discharge status: Home / Self Care | Payer: Medicare Other | Source: Ambulatory Visit | Attending: Internal Medicine | Admitting: Internal Medicine

## 2023-01-01 DIAGNOSIS — Z1231 Encounter for screening mammogram for malignant neoplasm of breast: Secondary | ICD-10-CM | POA: Diagnosis not present

## 2023-02-13 DIAGNOSIS — S42111D Displaced fracture of body of scapula, right shoulder, subsequent encounter for fracture with routine healing: Secondary | ICD-10-CM | POA: Diagnosis not present

## 2023-04-21 DIAGNOSIS — R051 Acute cough: Secondary | ICD-10-CM | POA: Diagnosis not present

## 2023-04-21 DIAGNOSIS — J029 Acute pharyngitis, unspecified: Secondary | ICD-10-CM | POA: Diagnosis not present

## 2023-04-21 DIAGNOSIS — R52 Pain, unspecified: Secondary | ICD-10-CM | POA: Diagnosis not present

## 2023-04-21 DIAGNOSIS — R5383 Other fatigue: Secondary | ICD-10-CM | POA: Diagnosis not present

## 2023-04-21 DIAGNOSIS — U071 COVID-19: Secondary | ICD-10-CM | POA: Diagnosis not present

## 2023-04-27 DIAGNOSIS — E039 Hypothyroidism, unspecified: Secondary | ICD-10-CM | POA: Diagnosis not present

## 2023-04-27 DIAGNOSIS — I10 Essential (primary) hypertension: Secondary | ICD-10-CM | POA: Diagnosis not present

## 2023-04-27 DIAGNOSIS — Z8739 Personal history of other diseases of the musculoskeletal system and connective tissue: Secondary | ICD-10-CM | POA: Diagnosis not present

## 2023-04-27 DIAGNOSIS — Z79899 Other long term (current) drug therapy: Secondary | ICD-10-CM | POA: Diagnosis not present

## 2023-04-27 DIAGNOSIS — M810 Age-related osteoporosis without current pathological fracture: Secondary | ICD-10-CM | POA: Diagnosis not present

## 2023-04-27 DIAGNOSIS — E785 Hyperlipidemia, unspecified: Secondary | ICD-10-CM | POA: Diagnosis not present

## 2023-05-02 DIAGNOSIS — H5213 Myopia, bilateral: Secondary | ICD-10-CM | POA: Diagnosis not present

## 2023-05-02 DIAGNOSIS — H2513 Age-related nuclear cataract, bilateral: Secondary | ICD-10-CM | POA: Diagnosis not present

## 2023-05-02 DIAGNOSIS — H43813 Vitreous degeneration, bilateral: Secondary | ICD-10-CM | POA: Diagnosis not present

## 2023-05-03 DIAGNOSIS — E871 Hypo-osmolality and hyponatremia: Secondary | ICD-10-CM | POA: Diagnosis not present

## 2023-05-30 DIAGNOSIS — Z23 Encounter for immunization: Secondary | ICD-10-CM | POA: Diagnosis not present

## 2023-06-06 DIAGNOSIS — M810 Age-related osteoporosis without current pathological fracture: Secondary | ICD-10-CM | POA: Diagnosis not present

## 2023-06-27 DIAGNOSIS — L918 Other hypertrophic disorders of the skin: Secondary | ICD-10-CM | POA: Diagnosis not present

## 2023-06-27 DIAGNOSIS — L821 Other seborrheic keratosis: Secondary | ICD-10-CM | POA: Diagnosis not present

## 2023-06-27 DIAGNOSIS — L905 Scar conditions and fibrosis of skin: Secondary | ICD-10-CM | POA: Diagnosis not present

## 2023-06-27 DIAGNOSIS — L814 Other melanin hyperpigmentation: Secondary | ICD-10-CM | POA: Diagnosis not present

## 2023-06-27 DIAGNOSIS — D1801 Hemangioma of skin and subcutaneous tissue: Secondary | ICD-10-CM | POA: Diagnosis not present

## 2023-06-27 DIAGNOSIS — D225 Melanocytic nevi of trunk: Secondary | ICD-10-CM | POA: Diagnosis not present

## 2023-06-27 DIAGNOSIS — D2371 Other benign neoplasm of skin of right lower limb, including hip: Secondary | ICD-10-CM | POA: Diagnosis not present

## 2023-06-27 DIAGNOSIS — L720 Epidermal cyst: Secondary | ICD-10-CM | POA: Diagnosis not present

## 2023-08-08 DIAGNOSIS — R300 Dysuria: Secondary | ICD-10-CM | POA: Diagnosis not present

## 2023-09-30 NOTE — Progress Notes (Unsigned)
Cardiology Office Note:    Date:  10/01/2023   ID:  Brittany Washington, DOB [birth year], MRN [id number]  PCP:  Thana Ates, MD   [facility] Providers Cardiologist:   Naliyah Neth    Referring MD: Thana Ates, MD   Chief Complaint  Patient presents with   Mitral Regurgitation   Hypertension           Dec. 7, 2022   Brittany Washington is a 73 y.o. female with a hx of hypertension, hypothyroidism, heart murmur, hyperlipidemia.  I last saw her in May, 2017.  She returns today to reestablish care.  Broke her R shoulder in Sept. - fell while carrying potted plants , also had an orbital fracture, and hairline fracture in her left elbow .   Here to reestablish care   Has pectus excavatum   Was very active prior to the shoulder fracture,    Retired Engineer, civil (consulting) from [employer] .   September 27, 2022: [name] is seen today for follow-up of her hypertension, hypothyroidism, mitral regurgitation.  She has a pectus excavatum.  Had another GI bleed.  She was found to have a small ulcer at endoscopy.  Still exercising .   Wt is 168.    Jan. 27, 2025 Brittany Washington is seen for follow up of her MR,  She has a pectus excavatum.  Is not happy with her weight .  Admits to eating more than she should   Lipids are managed by her primary MD     Past Medical History:  Diagnosis Date   Benign hypertensive heart disease without heart failure    GERD (gastroesophageal reflux disease)    Heart murmur, systolic    MECHNICAL   Hypertension    mild, lost weight, no meds now   Hypothyroidism    Osteoporosis    Primary osteoarthritis of right hip 06/18/2018   Seizure [facility])     Past Surgical History:  Procedure Laterality Date   APPENDECTOMY     BACK SURGERY     BIOPSY  03/10/2019   Procedure: BIOPSY;  Surgeon: Rachael Fee, MD;  Location: [facility] ENDOSCOPY;  Service: Endoscopy;;   BIOPSY  06/05/2022   Procedure: BIOPSY;  Surgeon: Meryl Dare, MD;  Location: [facility] ENDOSCOPY;  Service:  Gastroenterology;;   COLONOSCOPY     ESOPHAGOGASTRODUODENOSCOPY N/A 03/10/2019   Procedure: ESOPHAGOGASTRODUODENOSCOPY (EGD);  Surgeon: Rachael Fee, MD;  Location: [facility] ENDOSCOPY;  Service: Endoscopy;  Laterality: N/A;   ESOPHAGOGASTRODUODENOSCOPY (EGD) WITH PROPOFOL N/A 06/05/2022   Procedure: ESOPHAGOGASTRODUODENOSCOPY (EGD) WITH PROPOFOL;  Surgeon: Meryl Dare, MD;  Location: [facility] ENDOSCOPY;  Service: Gastroenterology;  Laterality: N/A;   FRACTURE SURGERY     HAMMER TOE SURGERY Left 07/14/2013   Procedure: HAMMER TOE CORRECTION LEFT SECOND TOE, AUSTIN/AKIN LEFT;  Surgeon: Sherin Quarry, DPM;  Location: [facility];  Service: Podiatry;  Laterality: Left;   HOT HEMOSTASIS N/A 06/05/2022   Procedure: HOT HEMOSTASIS (ARGON PLASMA COAGULATION/BICAP);  Surgeon: Meryl Dare, MD;  Location: [facility];  Service: Gastroenterology;  Laterality: N/A;   JOINT REPLACEMENT     LAPAROSCOPIC APPENDECTOMY N/A 12/29/2016   Procedure: APPENDECTOMY LAPAROSCOPIC;  Surgeon: Glenna Fellows, MD;  Location: [facility] ORS;  Service: General;  Laterality: N/A;   LAPAROSCOPIC CHOLECYSTECTOMY  1995   LUMBAR DISKECTOMY W/ DISSECTION, LEFT L4 -- L5  02-23-2004   METATARSAL OSTEOTOMY Left 04/02/2015   Procedure: LEFT FOOT METATARSAL OSTEOTOMY;  Surgeon: Marcene Corning, MD;  Location: [facility];  Service: Orthopedics;  Laterality: Left;   METATARSAL OSTEOTOMY Left 08/18/2016   Procedure: METATARSAL OSTEOTOMY SECOND TOE;  Surgeon: Marcene Corning, MD;  Location: [facility];  Service: Orthopedics;  Laterality: Left;   ORIF LEFT DISTAL RADIAL FX W/ BONE GRAFT  01-14-2009   ORIF SHOULDER FRACTURE Right 11/16/2022   Procedure: OPEN REDUCTION INTERNAL FIXATION (ORIF) SHOULDER FRACTURE;  Surgeon: Bjorn Pippin, MD;  Location: [facility];  Service: Orthopedics;  Laterality: Right;   POSTERIOR LUMBAR FUSION  05-16-2005   L4 -- L5   RE-DO LUMBAR DISKECTOMY , L4  - L5   05-03-2004   REVERSE SHOULDER ARTHROPLASTY Right 05/23/2021   Procedure: REVERSE SHOULDER ARTHROPLASTY;  Surgeon: Bjorn Pippin, MD;  Location: [facility];  Service: Orthopedics;  Laterality: Right;   TOTAL HIP ARTHROPLASTY Right 06/18/2018   TOTAL HIP ARTHROPLASTY Right 06/18/2018   Procedure: RIGHT TOTAL HIP ARTHROPLASTY ANTERIOR APPROACH;  Surgeon: Marcene Corning, MD;  Location: [facility];  Service: Orthopedics;  Laterality: Right;   TRIGGER FINGER RELEASE  01/09/2012   Procedure: RELEASE TRIGGER FINGER/A-1 PULLEY;  Surgeon: Wyn Forster., MD;  Location: [facility];  Service: Orthopedics;  Laterality: Left;   VAGINAL HYSTERECTOMY  1993   W/ BILATERAL SALPINGOOPHORECTOMY   WRIST FRACTURE SURGERY Left     Current Medications: Current Meds  Medication Sig   cetirizine (ZYRTEC) 10 MG tablet Take 10 mg by mouth at bedtime.    Cholecalciferol (VITAMIN D3) 400 units CAPS Take 400 Units by mouth daily.   clonazePAM (KLONOPIN) 1 MG tablet Take 1 tablet (1 mg total) by mouth at bedtime. Take 1-2 tablets by mouth daily at bedtime as needed for sleep.   cyanocobalamin 1000 MCG tablet Take 1 tablet (1,000 mcg total) by mouth daily.   denosumab (PROLIA) 60 MG/ML SOSY injection Inject 60 mg into the skin every 6 (six) months.   diphenhydrAMINE (BENADRYL) 25 MG tablet Take 25 mg by mouth at bedtime as needed for allergies.   diphenoxylate-atropine (LOMOTIL) 2.5-0.025 MG tablet Take 1 tablet by mouth 4 (four) times daily as needed for diarrhea or loose stools.   hydrochlorothiazide (MICROZIDE) 12.5 MG capsule TAKE 1 CAPSULE BY MOUTH DAILY.   levothyroxine (SYNTHROID) 125 MCG tablet Take 1 tablet (125 mcg total) by mouth daily. (Patient taking differently: Take 137 mcg by mouth daily.)   methocarbamol (ROBAXIN) 500 MG tablet Take 500 mg by mouth 2 (two) times daily as needed for muscle spasms.   metoprolol succinate (TOPROL-XL) 25 MG 24 hr tablet Take 12.5 mg by mouth 2  (two) times daily.   pantoprazole (PROTONIX) 40 MG tablet Take 1 tablet (40 mg total) by mouth 2 (two) times daily.   thiamine (VITAMIN B-1) 100 MG tablet Take 1 tablet (100 mg total) by mouth daily.   Turmeric 500 MG CAPS Take 500 mg by mouth daily with breakfast.   valsartan (DIOVAN) 160 MG tablet Take 160 mg by mouth daily.     Allergies:   Sulfa antibiotics, Fosamax [alendronate], and Nsaids   Social History   Socioeconomic History   Marital status: Married    Spouse name: Not on file   Number of children: 1   Years of education: 14   Highest education level: Not on file  Occupational History   Occupation: RN  Tobacco Use   Smoking status: Never   Smokeless tobacco: Never  Vaping Use   Vaping status: Never Used  Substance and  Sexual Activity   Alcohol use: Yes    Alcohol/week: 7.0 standard drinks of alcohol    Types: 7 Glasses of wine per week    Comment: 1 WINE DAILY   Drug use: No   Sexual activity: Yes  Other Topics Concern   Not on file  Social History Narrative   Patient drinks about 2 cups of caffeine daily.   Patient is right handed.    Social Drivers of Corporate investment banker Strain: Not on file  Food Insecurity: No Food Insecurity (06/07/2022)   Hunger Vital Sign    Worried About Running Out of Food in the Last Year: Never true    Ran Out of Food in the Last Year: Never true  Transportation Needs: No Transportation Needs (06/07/2022)   PRAPARE - Administrator, Civil Service (Medical): No    Lack of Transportation (Non-Medical): No  Physical Activity: Not on file  Stress: Not on file  Social Connections: Not on file     Family History: The patient's family history includes Colon cancer in her father; Heart attack in her brother and father; Ovarian cancer in her mother. There is no history of Rectal cancer, Stomach cancer, or Breast cancer.  ROS:   Please see the history of present illness.     All other systems reviewed and are  negative.  EKGs/Labs/Other Studies Reviewed:    The following studies were reviewed today:   EKG:    EKG Interpretation Date/Time:  Monday October 01 2023 11:09:34 EST Ventricular Rate:  56 PR Interval:  154 QRS Duration:  82 QT Interval:  444 QTC Calculation: 428 R Axis:   56  Text Interpretation: Sinus bradycardia Low voltage QRS T wave abnormality, consider inferior ischemia When compared with ECG of 07-Jun-2022 02:46,  HR is slower , ST abnormalities have resolved. Confirmed by Kristeen Miss [phone]) on 10/01/2023 5:27:09 PM     Recent Labs: 11/16/2022: BUN 20; Creatinine, Ser 1.14; Potassium 4.6; Sodium 131  Recent Lipid Panel    Component Value Date/Time   CHOL 211 (H) 01/24/2016 0937   TRIG 199 (H) 01/24/2016 0937   HDL 76 01/24/2016 0937   CHOLHDL 2.8 01/24/2016 0937   VLDL 40 (H) 01/24/2016 0937   LDLCALC 95 01/24/2016 0937   LDLDIRECT 78.9 01/21/2013 0822     Risk Assessment/Calculations:           Physical Exam:     Physical Exam: Blood pressure 122/72, pulse 61, height 5\' 4"  (1.626 m), weight 176 lb (79.8 kg), SpO2 94%.       GEN:  Well nourished, well developed in no acute distress HEENT: Normal NECK: No JVD; No carotid bruits LYMPHATICS: No lymphadenopathy CARDIAC: RRR , very soft systolic murmur  RESPIRATORY:  Clear to auscultation without rales, wheezing or rhonchi  ABDOMEN: Soft, non-tender, non-distended MUSCULOSKELETAL:  No edema; No deformity  SKIN: Warm and dry NEUROLOGIC:  Alert and oriented x 3    ASSESSMENT:    1. Nonrheumatic mitral valve regurgitation   2. Mixed hyperlipidemia   3. Primary hypertension      PLAN:       HTN:   Blood pressure is well-controlled.  Continue current medications.    2.  Mitral regurgitation: She had mild regurgitation by echo in 2016.  She only has a very soft systolic murmur today.  I do not think it has progressed.      Medication Adjustments/Labs and Tests Ordered: Current  medicines are reviewed  at length with the patient today.  Concerns regarding medicines are outlined above.  Orders Placed This Encounter  Procedures   EKG 12-Lead    No orders of the defined types were placed in this encounter.    Patient Instructions  Follow-Up: At [facility], you and your health needs are our priority.  As part of our continuing mission to provide you with exceptional heart care, we have created designated Provider Care Teams.  These Care Teams include your primary Cardiologist (physician) and Advanced Practice Providers (APPs -  Physician Assistants and Nurse Practitioners) who all work together to provide you with the care you need, when you need it.  Your next appointment:   1 year(s)  Provider:   Kristeen Miss, MD          Signed, Kristeen Miss, MD  10/01/2023 5:27 PM    [facility]

## 2023-10-01 ENCOUNTER — Encounter: Payer: Self-pay | Admitting: Cardiovascular Disease

## 2023-10-01 ENCOUNTER — Ambulatory Visit: Payer: Medicare Other | Attending: Cardiovascular Disease | Admitting: Cardiovascular Disease

## 2023-10-01 VITALS — BP 122/72 | HR 61 | Ht 64.0 in | Wt 176.0 lb

## 2023-10-01 DIAGNOSIS — E782 Mixed hyperlipidemia: Secondary | ICD-10-CM | POA: Insufficient documentation

## 2023-10-01 DIAGNOSIS — I340 Nonrheumatic mitral (valve) insufficiency: Secondary | ICD-10-CM | POA: Insufficient documentation

## 2023-10-01 DIAGNOSIS — I10 Essential (primary) hypertension: Secondary | ICD-10-CM | POA: Diagnosis not present

## 2023-10-01 NOTE — Patient Instructions (Signed)
Follow-Up: At [facility], you and your health needs are our priority.  As part of our continuing mission to provide you with exceptional heart care, we have created designated Provider Care Teams.  These Care Teams include your primary Cardiologist (physician) and Advanced Practice Providers (APPs -  Physician Assistants and Nurse Practitioners) who all work together to provide you with the care you need, when you need it.  Your next appointment:   1 year(s)  Provider:   Kristeen Miss, MD

## 2023-11-01 DIAGNOSIS — E039 Hypothyroidism, unspecified: Secondary | ICD-10-CM | POA: Diagnosis not present

## 2023-11-01 DIAGNOSIS — M810 Age-related osteoporosis without current pathological fracture: Secondary | ICD-10-CM | POA: Diagnosis not present

## 2023-11-01 DIAGNOSIS — Z0000 Encounter for general adult medical examination without abnormal findings: Secondary | ICD-10-CM | POA: Diagnosis not present

## 2023-11-01 DIAGNOSIS — E785 Hyperlipidemia, unspecified: Secondary | ICD-10-CM | POA: Diagnosis not present

## 2023-11-01 DIAGNOSIS — Z8739 Personal history of other diseases of the musculoskeletal system and connective tissue: Secondary | ICD-10-CM | POA: Diagnosis not present

## 2023-11-01 DIAGNOSIS — I10 Essential (primary) hypertension: Secondary | ICD-10-CM | POA: Diagnosis not present

## 2023-11-01 DIAGNOSIS — E669 Obesity, unspecified: Secondary | ICD-10-CM | POA: Diagnosis not present

## 2023-11-01 DIAGNOSIS — Z79899 Other long term (current) drug therapy: Secondary | ICD-10-CM | POA: Diagnosis not present

## 2023-11-29 ENCOUNTER — Other Ambulatory Visit: Payer: Self-pay | Admitting: Internal Medicine

## 2023-11-29 DIAGNOSIS — Z1231 Encounter for screening mammogram for malignant neoplasm of breast: Secondary | ICD-10-CM

## 2023-12-17 DIAGNOSIS — M810 Age-related osteoporosis without current pathological fracture: Secondary | ICD-10-CM | POA: Diagnosis not present

## 2023-12-28 DIAGNOSIS — J069 Acute upper respiratory infection, unspecified: Secondary | ICD-10-CM | POA: Diagnosis not present

## 2024-01-15 ENCOUNTER — Ambulatory Visit
Admission: RE | Admit: 2024-01-15 | Discharge: 2024-01-15 | Disposition: A | Discharge status: Home / Self Care | Source: Ambulatory Visit | Attending: Internal Medicine | Admitting: Internal Medicine

## 2024-01-15 DIAGNOSIS — Z1231 Encounter for screening mammogram for malignant neoplasm of breast: Secondary | ICD-10-CM | POA: Diagnosis not present

## 2024-03-25 DIAGNOSIS — M79641 Pain in right hand: Secondary | ICD-10-CM | POA: Diagnosis not present

## 2024-03-25 DIAGNOSIS — M25521 Pain in right elbow: Secondary | ICD-10-CM | POA: Diagnosis not present

## 2024-03-25 DIAGNOSIS — M25511 Pain in right shoulder: Secondary | ICD-10-CM | POA: Diagnosis not present

## 2024-04-01 DIAGNOSIS — S62346A Nondisplaced fracture of base of fifth metacarpal bone, right hand, initial encounter for closed fracture: Secondary | ICD-10-CM | POA: Diagnosis not present

## 2024-04-11 ENCOUNTER — Ambulatory Visit: Attending: Cardiology

## 2024-04-11 ENCOUNTER — Telehealth: Payer: Self-pay | Admitting: Cardiovascular Disease

## 2024-04-11 DIAGNOSIS — S82031A Displaced transverse fracture of right patella, initial encounter for closed fracture: Secondary | ICD-10-CM | POA: Diagnosis not present

## 2024-04-11 DIAGNOSIS — Z01810 Encounter for preprocedural cardiovascular examination: Secondary | ICD-10-CM

## 2024-04-11 NOTE — Telephone Encounter (Signed)
  Patient Consent for Virtual Visit        [name] has provided verbal consent on 04/11/2024 for a virtual visit (video or telephone).   CONSENT FOR VIRTUAL VISIT FOR:  Brittany Washington  By participating in this virtual visit I agree to the following:  I hereby voluntarily request, consent and authorize [facility] and its employed or contracted physicians, physician assistants, nurse practitioners or other licensed health care professionals (the Practitioner), to provide me with telemedicine health care services (the "Services) as deemed necessary by the treating Practitioner. I acknowledge and consent to receive the Services by the Practitioner via telemedicine. I understand that the telemedicine visit will involve communicating with the Practitioner through live audiovisual communication technology and the disclosure of certain medical information by electronic transmission. I acknowledge that I have been given the opportunity to request an in-person assessment or other available alternative prior to the telemedicine visit and am voluntarily participating in the telemedicine visit.  I understand that I have the right to withhold or withdraw my consent to the use of telemedicine in the course of my care at any time, without affecting my right to future care or treatment, and that the Practitioner or I may terminate the telemedicine visit at any time. I understand that I have the right to inspect all information obtained and/or recorded in the course of the telemedicine visit and may receive copies of available information for a reasonable fee.  I understand that some of the potential risks of receiving the Services via telemedicine include:  Delay or interruption in medical evaluation due to technological equipment failure or disruption; Information transmitted may not be sufficient (e.g. poor resolution of images) to allow for appropriate medical decision making by the  Practitioner; and/or  In rare instances, security protocols could fail, causing a breach of personal health information.  Furthermore, I acknowledge that it is my responsibility to provide information about my medical history, conditions and care that is complete and accurate to the best of my ability. I acknowledge that Practitioner's advice, recommendations, and/or decision may be based on factors not within their control, such as incomplete or inaccurate data provided by me or distortions of diagnostic images or specimens that may result from electronic transmissions. I understand that the practice of medicine is not an exact science and that Practitioner makes no warranties or guarantees regarding treatment outcomes. I acknowledge that a copy of this consent can be made available to me via my patient portal [facility] MyChart), or I can request a printed copy by calling the office of [facility].    I understand that my insurance will be billed for this visit.   I have read or had this consent read to me. I understand the contents of this consent, which adequately explains the benefits and risks of the Services being provided via telemedicine.  I have been provided ample opportunity to ask questions regarding this consent and the Services and have had my questions answered to my satisfaction. I give my informed consent for the services to be provided through the use of telemedicine in my medical care

## 2024-04-11 NOTE — Telephone Encounter (Signed)
   Name: Brittany Washington  DOB: May 02, 1951  MRN: [id number]  Primary Cardiologist: None   Preoperative team, please contact this patient and set up a phone call appointment for further preoperative risk assessment. Please obtain consent and complete medication review. Thank you for your help.  I confirm that guidance regarding antiplatelet and oral anticoagulation therapy has been completed and, if necessary, noted below.  Patient not currently on any anticoagulation or dual anti-platelet therapy.  I also confirmed the patient resides in the state of [place] . As per [facility] [place] Medical Board telemedicine laws, the patient must reside in the state in which the provider is licensed.  Miriam Shams, FNP-C  04/11/2024, 12:16 PM [facility] [address] 5[address] Floor Office [phone] Fax [phone]

## 2024-04-11 NOTE — Telephone Encounter (Signed)
 Pt. Has been scheduled for pre-op on 04/11/24 with Orren Fabry, PA-C.

## 2024-04-11 NOTE — Progress Notes (Signed)
 Virtual Visit via Telephone Note   Because of Brittany Washington co-morbid illnesses, she is at least at moderate risk for complications without adequate follow up.  This format is felt to be most appropriate for this patient at this time.  Due to technical limitations with video connection (technology), today's appointment will be conducted as an audio only telehealth visit, and Brittany Washington verbally agreed to proceed in this manner.   All issues noted in this document were discussed and addressed.  No physical exam could be performed with this format.  Evaluation Performed:  Preoperative cardiovascular risk assessment _____________   Date:  04/11/2024   Patient ID:  Brittany Washington, DOB 21-Feb-1951, MRN [id number] Patient Location:  Home Provider location:   Office  Primary Care Provider:  Dwight Trula SQUIBB, MD Primary Cardiologist:  None  Chief Complaint / Patient Profile   73 y.o. y/o female with a h/o hypertension, hypothyroidism, heart murmur, and hyperlipidemia who is pending right patella open reduction internal fixation and presents today for telephonic preoperative cardiovascular risk assessment.  History of Present Illness    Brittany Washington is a 73 y.o. female who presents via audio/video conferencing for a telehealth visit today.  Pt was last seen in cardiology clinic on 10/01/2023 by Dr. Alveta.  At that time Brittany Washington was doing well.  The patient is now pending procedure as outlined above. Since her last visit, she feels good without any chest pains or shortness of breath.  She stays very active doing Silver sneakers and chair yoga.  She obviously has not been doing these things since she broke her patella.  However, she does surpassed the 4 METS on the DASI.  No medications need to be held from a cardiovascular standpoint.  I did mention she should hold her turmeric a week before surgery.  Past Medical History    Past Medical History:  Diagnosis Date    Benign hypertensive heart disease without heart failure    GERD (gastroesophageal reflux disease)    Heart murmur, systolic    MECHNICAL   Hypertension    mild, lost weight, no meds now   Hypothyroidism    Osteoporosis    Primary osteoarthritis of right hip 06/18/2018   Seizure [name]    Past Surgical History:  Procedure Laterality Date   APPENDECTOMY     BACK SURGERY     BIOPSY  03/10/2019   Procedure: BIOPSY;  Surgeon: Teressa Toribio SQUIBB, MD;  Location: [facility] ENDOSCOPY;  Service: Endoscopy;;   BIOPSY  06/05/2022   Procedure: BIOPSY;  Surgeon: Aneita Gwendlyn DASEN, MD;  Location: [facility] ENDOSCOPY;  Service: Gastroenterology;;   COLONOSCOPY     ESOPHAGOGASTRODUODENOSCOPY N/A 03/10/2019   Procedure: ESOPHAGOGASTRODUODENOSCOPY (EGD);  Surgeon: Teressa Toribio SQUIBB, MD;  Location: [facility] ENDOSCOPY;  Service: Endoscopy;  Laterality: N/A;   ESOPHAGOGASTRODUODENOSCOPY (EGD) WITH PROPOFOL  N/A 06/05/2022   Procedure: ESOPHAGOGASTRODUODENOSCOPY (EGD) WITH PROPOFOL ;  Surgeon: Aneita Gwendlyn DASEN, MD;  Location: [facility] ENDOSCOPY;  Service: Gastroenterology;  Laterality: N/A;   FRACTURE SURGERY     HAMMER TOE SURGERY Left 07/14/2013   Procedure: HAMMER TOE CORRECTION LEFT SECOND TOE, AUSTIN/AKIN LEFT;  Surgeon: Selinda KYM Slovak, DPM;  Location: [facility];  Service: Podiatry;  Laterality: Left;   HOT HEMOSTASIS N/A 06/05/2022   Procedure: HOT HEMOSTASIS (ARGON PLASMA COAGULATION/BICAP);  Surgeon: Aneita Gwendlyn DASEN, MD;  Location: [facility];  Service: Gastroenterology;  Laterality: N/A;   JOINT REPLACEMENT     LAPAROSCOPIC APPENDECTOMY N/A 12/29/2016  Procedure: APPENDECTOMY LAPAROSCOPIC;  Surgeon: Morene Olives, MD;  Location: [facility] ORS;  Service: General;  Laterality: N/A;   LAPAROSCOPIC CHOLECYSTECTOMY  1995   LUMBAR DISKECTOMY W/ DISSECTION, LEFT L4 -- L5  02-23-2004   METATARSAL OSTEOTOMY Left 04/02/2015   Procedure: LEFT FOOT METATARSAL OSTEOTOMY;  Surgeon: Maude Herald, MD;  Location: [facility];  Service: Orthopedics;  Laterality: Left;   METATARSAL OSTEOTOMY Left 08/18/2016   Procedure: METATARSAL OSTEOTOMY SECOND TOE;  Surgeon: Maude Herald, MD;  Location: [facility];  Service: Orthopedics;  Laterality: Left;   ORIF LEFT DISTAL RADIAL FX W/ BONE GRAFT  01-14-2009   ORIF SHOULDER FRACTURE Right 11/16/2022   Procedure: OPEN REDUCTION INTERNAL FIXATION (ORIF) SHOULDER FRACTURE;  Surgeon: Cristy Bonner DASEN, MD;  Location: [facility];  Service: Orthopedics;  Laterality: Right;   POSTERIOR LUMBAR FUSION  05-16-2005   L4 -- L5   RE-DO LUMBAR DISKECTOMY , L4  - L5  05-03-2004   REVERSE SHOULDER ARTHROPLASTY Right 05/23/2021   Procedure: REVERSE SHOULDER ARTHROPLASTY;  Surgeon: Cristy Bonner DASEN, MD;  Location: [facility];  Service: Orthopedics;  Laterality: Right;   TOTAL HIP ARTHROPLASTY Right 06/18/2018   TOTAL HIP ARTHROPLASTY Right 06/18/2018   Procedure: RIGHT TOTAL HIP ARTHROPLASTY ANTERIOR APPROACH;  Surgeon: Herald Maude, MD;  Location: [facility];  Service: Orthopedics;  Laterality: Right;   TRIGGER FINGER RELEASE  01/09/2012   Procedure: RELEASE TRIGGER FINGER/A-1 PULLEY;  Surgeon: Lamar LULLA Leonor Mickey., MD;  Location: [facility];  Service: Orthopedics;  Laterality: Left;   VAGINAL HYSTERECTOMY  1993   W/ BILATERAL SALPINGOOPHORECTOMY   WRIST FRACTURE SURGERY Left     Allergies  Allergies  Allergen Reactions   Sulfa Antibiotics Diarrhea and Other (See Comments)    Body aches    Fosamax [Alendronate] Other (See Comments)    Heartburn, myalgias   Nsaids Other (See Comments)    History of GI bleeding    Home Medications    Prior to Admission medications   Medication Sig Start Date End Date Taking? Authorizing Provider  cetirizine (ZYRTEC) 10 MG tablet Take 10 mg by mouth at bedtime.     [provider]  Cholecalciferol  (VITAMIN D3) 400 units CAPS Take 400 Units by mouth daily.    [provider]   clonazePAM  (KLONOPIN ) 1 MG tablet Take 1 tablet (1 mg total) by mouth at bedtime. Take 1-2 tablets by mouth daily at bedtime as needed for sleep. 12/20/15   Nahser, Aleene PARAS, MD  cyanocobalamin  1000 MCG tablet Take 1 tablet (1,000 mcg total) by mouth daily. 06/06/22   Krishnan, Gokul, MD  denosumab  (PROLIA ) 60 MG/ML SOSY injection Inject 60 mg into the skin every 6 (six) months.    [provider]  diphenhydrAMINE  (BENADRYL ) 25 MG tablet Take 25 mg by mouth at bedtime as needed for allergies.    [provider]  diphenoxylate-atropine (LOMOTIL) 2.5-0.025 MG tablet Take 1 tablet by mouth 4 (four) times daily as needed for diarrhea or loose stools.    [provider]  hydrochlorothiazide  (MICROZIDE ) 12.5 MG capsule TAKE 1 CAPSULE BY MOUTH DAILY. 09/21/15   Dominick Ned, MD  levothyroxine  (SYNTHROID ) 125 MCG tablet Take 1 tablet (125 mcg total) by mouth daily. Patient taking differently: Take 137 mcg by mouth daily. 06/06/22   Krishnan, Gokul, MD  methocarbamol  (ROBAXIN ) 500 MG tablet Take 500 mg by mouth 2 (two) times daily as needed for muscle spasms.  [provider]  metoprolol  succinate (TOPROL -XL) 25 MG 24 hr tablet Take 12.5 mg by mouth 2 (two) times daily. 09/08/22   [provider]  pantoprazole  (PROTONIX ) 40 MG tablet Take 1 tablet (40 mg total) by mouth 2 (two) times daily. 06/06/22   Krishnan, Gokul, MD  thiamine  (VITAMIN B-1) 100 MG tablet Take 1 tablet (100 mg total) by mouth daily. 06/06/22   Krishnan, Gokul, MD  Turmeric 500 MG CAPS Take 500 mg by mouth daily with breakfast.    [provider]  valsartan (DIOVAN) 160 MG tablet Take 160 mg by mouth daily. 09/26/18   [provider]    Physical Exam    Vital Signs:  Brittany Washington does not have vital signs available for review today.  Given telephonic nature of communication, physical exam is limited. AAOx3. NAD. Normal affect.  Speech and respirations are  unlabored.  Accessory Clinical Findings    None  Assessment & Plan    1.  Preoperative Cardiovascular Risk Assessment:  Brittany Washington's perioperative risk of a major cardiac event is 0.4% according to the Revised Cardiac Risk Index (RCRI).  Therefore, she is at low risk for perioperative complications.   Her functional capacity is good at 5.07 METs according to the [facility] Activity Status Index (DASI). Recommendations: According to ACC/AHA guidelines, no further cardiovascular testing needed.  The patient may proceed to surgery at acceptable risk.    The patient was advised that if she develops new symptoms prior to surgery to contact our office to arrange for a follow-up visit, and she verbalized understanding.   A copy of this note will be routed to requesting surgeon.  Time:   Today, I have spent 5 minutes with the patient with telehealth technology discussing medical history, symptoms, and management plan.     Orren LOISE Fabry, PA-C  04/11/2024, 3:28 PM

## 2024-04-11 NOTE — Telephone Encounter (Signed)
   Pre-operative Risk Assessment    Patient Name: Brittany Washington  DOB: [birth year] MRN: [id number]   Date of last office visit: 10/01/2023 Date of next office visit: TBD   Request for Surgical Clearance    Procedure:  Right patella open reduction internal fixation   Date of Surgery:  Clearance 04/15/24                                Surgeon:  Dr. Dalldorl Surgeon's Group or Practice Name:  [facility] Phone number:  [phone] Fax number:  [phone]   Type of Clearance Requested:   - Medical  - Pharmacy:  Hold TBD by Card      Type of Anesthesia:  General    Additional requests/questions:    SignedBernarda JONETTA Fireman   04/11/2024, 10:48 AM

## 2024-04-15 DIAGNOSIS — Y999 Unspecified external cause status: Secondary | ICD-10-CM | POA: Diagnosis not present

## 2024-04-15 DIAGNOSIS — S82031A Displaced transverse fracture of right patella, initial encounter for closed fracture: Secondary | ICD-10-CM | POA: Diagnosis not present

## 2024-04-15 DIAGNOSIS — G8918 Other acute postprocedural pain: Secondary | ICD-10-CM | POA: Diagnosis not present

## 2024-04-15 DIAGNOSIS — X58XXXA Exposure to other specified factors, initial encounter: Secondary | ICD-10-CM | POA: Diagnosis not present

## 2024-04-21 ENCOUNTER — Telehealth: Payer: Self-pay | Admitting: Cardiovascular Disease

## 2024-04-21 NOTE — Telephone Encounter (Signed)
 STAT if HR is under 50 or over 120  (normal HR is 60-100 beats per minute)  What is your heart rate? 50's   Do you have a log of your heart rate readings (document readings)? 50's   Do you have any other symptoms? She denies any other symptoms Pt called in stating she had surgery recently and her surgeon told her she had bradycardia.

## 2024-04-21 NOTE — Telephone Encounter (Signed)
 Left message for patient to call back

## 2024-04-23 NOTE — Telephone Encounter (Signed)
 Left message for pt to call.

## 2024-04-24 NOTE — Telephone Encounter (Signed)
 Left message for pt to call.

## 2024-04-25 DIAGNOSIS — M25561 Pain in right knee: Secondary | ICD-10-CM | POA: Diagnosis not present

## 2024-04-29 DIAGNOSIS — M25661 Stiffness of right knee, not elsewhere classified: Secondary | ICD-10-CM | POA: Diagnosis not present

## 2024-04-29 DIAGNOSIS — R262 Difficulty in walking, not elsewhere classified: Secondary | ICD-10-CM | POA: Diagnosis not present

## 2024-05-01 DIAGNOSIS — M25661 Stiffness of right knee, not elsewhere classified: Secondary | ICD-10-CM | POA: Diagnosis not present

## 2024-05-01 DIAGNOSIS — R001 Bradycardia, unspecified: Secondary | ICD-10-CM | POA: Diagnosis not present

## 2024-05-01 DIAGNOSIS — R262 Difficulty in walking, not elsewhere classified: Secondary | ICD-10-CM | POA: Diagnosis not present

## 2024-05-01 DIAGNOSIS — E669 Obesity, unspecified: Secondary | ICD-10-CM | POA: Diagnosis not present

## 2024-05-01 DIAGNOSIS — E039 Hypothyroidism, unspecified: Secondary | ICD-10-CM | POA: Diagnosis not present

## 2024-05-01 DIAGNOSIS — I10 Essential (primary) hypertension: Secondary | ICD-10-CM | POA: Diagnosis not present

## 2024-05-07 DIAGNOSIS — M25661 Stiffness of right knee, not elsewhere classified: Secondary | ICD-10-CM | POA: Diagnosis not present

## 2024-05-07 DIAGNOSIS — R262 Difficulty in walking, not elsewhere classified: Secondary | ICD-10-CM | POA: Diagnosis not present

## 2024-05-08 DIAGNOSIS — R262 Difficulty in walking, not elsewhere classified: Secondary | ICD-10-CM | POA: Diagnosis not present

## 2024-05-08 DIAGNOSIS — M25661 Stiffness of right knee, not elsewhere classified: Secondary | ICD-10-CM | POA: Diagnosis not present

## 2024-05-12 DIAGNOSIS — M25661 Stiffness of right knee, not elsewhere classified: Secondary | ICD-10-CM | POA: Diagnosis not present

## 2024-05-12 DIAGNOSIS — Z23 Encounter for immunization: Secondary | ICD-10-CM | POA: Diagnosis not present

## 2024-05-12 DIAGNOSIS — R262 Difficulty in walking, not elsewhere classified: Secondary | ICD-10-CM | POA: Diagnosis not present

## 2024-05-13 DIAGNOSIS — H43813 Vitreous degeneration, bilateral: Secondary | ICD-10-CM | POA: Diagnosis not present

## 2024-05-13 DIAGNOSIS — H25813 Combined forms of age-related cataract, bilateral: Secondary | ICD-10-CM | POA: Diagnosis not present

## 2024-05-14 DIAGNOSIS — M25661 Stiffness of right knee, not elsewhere classified: Secondary | ICD-10-CM | POA: Diagnosis not present

## 2024-05-14 DIAGNOSIS — R262 Difficulty in walking, not elsewhere classified: Secondary | ICD-10-CM | POA: Diagnosis not present

## 2024-05-14 DIAGNOSIS — M25561 Pain in right knee: Secondary | ICD-10-CM | POA: Diagnosis not present

## 2024-05-19 DIAGNOSIS — R262 Difficulty in walking, not elsewhere classified: Secondary | ICD-10-CM | POA: Diagnosis not present

## 2024-05-19 DIAGNOSIS — M25661 Stiffness of right knee, not elsewhere classified: Secondary | ICD-10-CM | POA: Diagnosis not present

## 2024-05-21 DIAGNOSIS — M25661 Stiffness of right knee, not elsewhere classified: Secondary | ICD-10-CM | POA: Diagnosis not present

## 2024-05-21 DIAGNOSIS — R262 Difficulty in walking, not elsewhere classified: Secondary | ICD-10-CM | POA: Diagnosis not present

## 2024-05-28 ENCOUNTER — Encounter: Payer: Self-pay | Admitting: *Deleted

## 2024-05-30 ENCOUNTER — Encounter: Payer: Self-pay | Admitting: Cardiovascular Disease

## 2024-05-30 ENCOUNTER — Ambulatory Visit: Attending: Cardiovascular Disease | Admitting: Cardiovascular Disease

## 2024-05-30 VITALS — BP 110/70 | HR 71 | Ht 64.0 in | Wt 174.8 lb

## 2024-05-30 DIAGNOSIS — E782 Mixed hyperlipidemia: Secondary | ICD-10-CM | POA: Diagnosis not present

## 2024-05-30 DIAGNOSIS — I340 Nonrheumatic mitral (valve) insufficiency: Secondary | ICD-10-CM | POA: Diagnosis not present

## 2024-05-30 DIAGNOSIS — I10 Essential (primary) hypertension: Secondary | ICD-10-CM | POA: Diagnosis not present

## 2024-05-30 NOTE — Patient Instructions (Signed)

## 2024-05-30 NOTE — Assessment & Plan Note (Signed)
 Blood pressure well-controlled on valsartan.  Seems to be feeling much better off of a beta-blocker and thiazide diuretic.  Remains normotensive.

## 2024-05-30 NOTE — Assessment & Plan Note (Signed)
 No MR murmur on exam.  Patient completely asymptomatic.  Continue clinical follow-up.

## 2024-05-30 NOTE — Progress Notes (Signed)
 Cardiology Office Note:    Date:  05/30/2024   ID:  Brittany Washington, DOB 11-08-1950, MRN [id number]  PCP:  Dwight Trula SQUIBB, MD   [facility] Providers Cardiologist:  None     Referring MD: No ref. provider found   Chief Complaint  Patient presents with   Mitral Regurgitation    History of Present Illness:    Brittany Washington is a 73 y.o. female presenting for follow-up of mitral regurgitation.  She has been followed by Dr. Alveta and I will be seeing her in his retirement.  Her last visit with him was in January 2025 at which time she was felt to be clinically stable.Her last echocardiogram in 2016 showed normal LVEF of 60 to 65% and mild mitral regurgitation.  The patient is here alone today.  She feels well.  She was noted to have sinus bradycardia and her primary care doctor discontinued metoprolol .  She is also discontinued hydrochlorothiazide .  She remains on valsartan for blood pressure.  She is feeling much better since she is cut back on her medicine.  States that her heart rate usually runs in the 60s.  She has been normotensive.  She denies chest pain chest pressure, or shortness of breath.  She recently had a knee injury requiring surgery.  She had no cardiac issues around that.   Current Medications: Current Meds  Medication Sig   cetirizine (ZYRTEC) 10 MG tablet Take 10 mg by mouth at bedtime.    Cholecalciferol  (VITAMIN D3) 400 units CAPS Take 400 Units by mouth daily.   clonazePAM  (KLONOPIN ) 1 MG tablet Take 1 tablet (1 mg total) by mouth at bedtime. Take 1-2 tablets by mouth daily at bedtime as needed for sleep.   denosumab  (PROLIA ) 60 MG/ML SOSY injection Inject 60 mg into the skin every 6 (six) months.   diphenhydrAMINE  (BENADRYL ) 25 MG tablet Take 25 mg by mouth at bedtime as needed for allergies.   diphenoxylate-atropine (LOMOTIL) 2.5-0.025 MG tablet Take 1 tablet by mouth 4 (four) times daily as needed for diarrhea or loose stools.    hydrochlorothiazide  (MICROZIDE ) 12.5 MG capsule TAKE 1 CAPSULE BY MOUTH DAILY.   levothyroxine  (SYNTHROID ) 137 MCG tablet Take 137 mcg by mouth daily before breakfast.   methocarbamol  (ROBAXIN ) 500 MG tablet Take 500 mg by mouth 2 (two) times daily as needed for muscle spasms.   pantoprazole  (PROTONIX ) 40 MG tablet Take 1 tablet (40 mg total) by mouth 2 (two) times daily.   thiamine  (VITAMIN B-1) 100 MG tablet Take 1 tablet (100 mg total) by mouth daily.   valsartan (DIOVAN) 160 MG tablet Take 160 mg by mouth daily.   [DISCONTINUED] metoprolol  succinate (TOPROL -XL) 25 MG 24 hr tablet Take 12.5 mg by mouth 2 (two) times daily.     Allergies:   Sulfa antibiotics, Fosamax [alendronate], and Nsaids   ROS:   Please see the history of present illness.    All other systems reviewed and are negative.  EKGs/Labs/Other Studies Reviewed:    The following studies were reviewed today: Cardiac Studies & Procedures   ______________________________________________________________________________________________     ECHOCARDIOGRAM  ECHOCARDIOGRAM COMPLETE 06/25/2015  Narrative *[place]* *[facility]* [address] [place], [place] [address] [phone]  ------------------------------------------------------------------- Transthoracic Echocardiography  Patient:    Brittany, Washington MR #:       [id number] Study Date: 06/25/2015 Gender:     F Age:        35 Height:  162.6 cm Weight:     63.5 kg BSA:        1.7 m^2 Pt. Status: Room:       5M19C  SONOGRAPHER  Jon Hacker ATTENDING    Earley Saucer [id number] TISA Dasen, Vina HERO REFERRING    Dasen Vina M PERFORMING   [facility], Inpatient  cc:  ------------------------------------------------------------------- LV EF: 60% -   65%  ------------------------------------------------------------------- Indications:      TIA  435.9.  ------------------------------------------------------------------- History:   PMH:  Hypothyroidism.  Risk factors:  Diabetes mellitus. Dyslipidemia.  ------------------------------------------------------------------- Study Conclusions  - Left ventricle: The cavity size was normal. Systolic function was normal. The estimated ejection fraction was in the range of 60% to 65%. Wall motion was normal; there were no regional wall motion abnormalities. Doppler parameters are consistent with abnormal left ventricular relaxation (grade 1 diastolic dysfunction). - Mitral valve: There was mild regurgitation.  Impressions:  - No cardiac source of emboli was indentified.  Transthoracic echocardiography.  M-mode, complete 2D, spectral Doppler, and color Doppler.  Birthdate:  Patient birthdate: November 07, 1950.  Age:  Patient is 73 yr old.  Sex:  Gender: female. BMI: 24 kg/m^2.  Blood pressure:     141/87  Patient status: Inpatient.  Study date:  Study date: 06/25/2015. Study time: 02:39 PM.  Location:  Echo laboratory.  -------------------------------------------------------------------  ------------------------------------------------------------------- Left ventricle:  The cavity size was normal. Systolic function was normal. The estimated ejection fraction was in the range of 60% to 65%. Wall motion was normal; there were no regional wall motion abnormalities. Doppler parameters are consistent with abnormal left ventricular relaxation (grade 1 diastolic dysfunction).  ------------------------------------------------------------------- Aortic valve:   Trileaflet; normal thickness leaflets. Mobility was not restricted.  Doppler:  Transvalvular velocity was within the normal range. There was no stenosis. There was no regurgitation.  ------------------------------------------------------------------- Aorta:  Aortic root: The aortic root was normal in  size.  ------------------------------------------------------------------- Mitral valve:   Structurally normal valve.   Mobility was not restricted.  Doppler:  Transvalvular velocity was within the normal range. There was no evidence for stenosis. There was mild regurgitation.  ------------------------------------------------------------------- Left atrium:  The atrium was normal in size.  ------------------------------------------------------------------- Right ventricle:  The cavity size was normal. Wall thickness was normal. Systolic function was normal.  ------------------------------------------------------------------- Pulmonic valve:   Poorly visualized.  Structurally normal valve. Cusp separation was normal.  Doppler:  Transvalvular velocity was within the normal range. There was no evidence for stenosis. There was no regurgitation.  ------------------------------------------------------------------- Tricuspid valve:   Structurally normal valve.    Doppler: Transvalvular velocity was within the normal range. There was no regurgitation.  ------------------------------------------------------------------- Pulmonary artery:   The main pulmonary artery was normal-sized. Systolic pressure was within the normal range.  ------------------------------------------------------------------- Right atrium:  The atrium was normal in size.  ------------------------------------------------------------------- Pericardium:  There was no pericardial effusion.  ------------------------------------------------------------------- Systemic veins: Inferior vena cava: The vessel was normal in size.  ------------------------------------------------------------------- Measurements  Left ventricle                         Value        Reference LV ID, ED, PLAX chordal        (L)     38    mm     43 - 52 LV ID, ES, PLAX chordal                23    mm  23 - 38 LV fx shortening, PLAX  chordal         39    %      >=29 LV PW thickness, ED                    10    mm     --------- IVS/LV PW ratio, ED                    1.1          <=1.3 LV e&', lateral                         8.38  cm/s   --------- LV E/e&', lateral                       6.31         --------- LV e&', medial                          5.87  cm/s   --------- LV E/e&', medial                        9.01         --------- LV e&', average                         7.13  cm/s   --------- LV E/e&', average                       7.42         ---------  Ventricular septum                     Value        Reference IVS thickness, ED                      11    mm     ---------  LVOT                                   Value        Reference LVOT ID, S                             20    mm     --------- LVOT area                              3.14  cm^2   ---------  Aorta                                  Value        Reference Aortic root ID, ED                     29    mm     ---------  Left atrium                            Value  Reference LA ID, A-P, ES                         32    mm     --------- LA ID/bsa, A-P                         1.88  cm/m^2 <=2.2 LA volume, S                           33.7  ml     --------- LA volume/bsa, S                       19.8  ml/m^2 --------- LA volume, ES, 1-p A4C                 27.5  ml     --------- LA volume/bsa, ES, 1-p A4C             16.2  ml/m^2 --------- LA volume, ES, 1-p A2C                 39.9  ml     --------- LA volume/bsa, ES, 1-p A2C             23.4  ml/m^2 ---------  Mitral valve                           Value        Reference Mitral E-wave peak velocity            52.9  cm/s   --------- Mitral A-wave peak velocity            101   cm/s   --------- Mitral deceleration time       (H)     335   ms     150 - 230 Mitral E/A ratio, peak                 0.52         ---------  Pulmonary arteries                     Value        Reference PA pressure,  S, DP                     21    mm Hg  <=30  Tricuspid valve                        Value        Reference Tricuspid regurg peak velocity         213   cm/s   --------- Tricuspid peak RV-RA gradient          18    mm Hg  ---------  Systemic veins                         Value        Reference Estimated CVP                          3     mm Hg  ---------  Right ventricle  Value        Reference RV pressure, S, DP                     21    mm Hg  <=30 RV s&', lateral, S                      12.8  cm/s   ---------  Legend: (L)  and  (H)  mark values outside specified reference range.  ------------------------------------------------------------------- Prepared and Electronically Authenticated by  Oneil Parchment, M.D. 2016-10-21T16:26:51          ______________________________________________________________________________________________      EKG:        Recent Labs: No results found for requested labs within last 365 days.  Recent Lipid Panel    Component Value Date/Time   CHOL 211 (H) 01/24/2016 0937   TRIG 199 (H) 01/24/2016 0937   HDL 76 01/24/2016 0937   CHOLHDL 2.8 01/24/2016 0937   VLDL 40 (H) 01/24/2016 0937   LDLCALC 95 01/24/2016 0937   LDLDIRECT 78.9 01/21/2013 0822     Risk Assessment/Calculations:                Physical Exam:    VS:  BP 110/70   Pulse 71   Ht 5' 4 (1.626 m)   Wt 174 lb 12.8 oz (79.3 kg)   SpO2 96%   BMI 30.00 kg/m     Wt Readings from Last 3 Encounters:  05/30/24 174 lb 12.8 oz (79.3 kg)  10/01/23 176 lb (79.8 kg)  11/16/22 166 lb 0.1 oz (75.3 kg)     GEN:  Well nourished, well developed in no acute distress HEENT: Normal NECK: No JVD; No carotid bruits LYMPHATICS: No lymphadenopathy CARDIAC: RRR, 2/6 ejection murmur at the right upper sternal border, no apical murmur RESPIRATORY:  Clear to auscultation without rales, wheezing or rhonchi  ABDOMEN: Soft, non-tender,  non-distended MUSCULOSKELETAL:  No edema; No deformity  SKIN: Warm and dry NEUROLOGIC:  Alert and oriented x 3 PSYCHIATRIC:  Normal affect   Assessment & Plan Nonrheumatic mitral valve regurgitation No MR murmur on exam.  Patient completely asymptomatic.  Continue clinical follow-up. Primary hypertension Blood pressure well-controlled on valsartan.  Seems to be feeling much better off of a beta-blocker and thiazide diuretic.  Remains normotensive. Mixed hyperlipidemia Discussed lifestyle modification.  She is going to work on diet and weight loss.  Labs to be rechecked by her primary care physician early next year.       Medication Adjustments/Labs and Tests Ordered: Current medicines are reviewed at length with the patient today.  Concerns regarding medicines are outlined above.  No orders of the defined types were placed in this encounter.  No orders of the defined types were placed in this encounter.   Patient Instructions  Medication Instructions:  No medication changes were made at this visit. Continue current regimen.   *If you need a refill on your cardiac medications before your next appointment, please call your pharmacy*  Lab Work: None ordered today. If you have labs (blood work) drawn today and your tests are completely normal, you will receive your results only by: MyChart Message (if you have MyChart) OR A paper copy in the mail If you have any lab test that is abnormal or we need to change your treatment, we will call you to review the results.  Testing/Procedures: None ordered today.  Follow-Up: At [facility], you and your health  needs are our priority.  As part of our continuing mission to provide you with exceptional heart care, our providers are all part of one team.  This team includes your primary Cardiologist (physician) and Advanced Practice Providers or APPs (Physician Assistants and Nurse Practitioners) who all work together to provide you  with the care you need, when you need it.  Your next appointment:   1 year(s)  Provider:   Ozell Fell, MD      Signed, Ozell Fell, MD  05/30/2024 9:55 AM    [facility]

## 2024-06-26 DIAGNOSIS — M810 Age-related osteoporosis without current pathological fracture: Secondary | ICD-10-CM | POA: Diagnosis not present

## 2024-07-29 ENCOUNTER — Encounter: Payer: Self-pay | Admitting: Cardiovascular Disease

## 2024-09-09 ENCOUNTER — Telehealth: Payer: Self-pay | Admitting: Cardiovascular Disease

## 2024-09-09 NOTE — Telephone Encounter (Signed)
 Pt seeing Dr. Wonda can call her in Z-pak for sinus infection. Her PCP is on maternity leave. Please advise.   [id number] - [place], [place] - [address] AT [address] OF [facility] [address]

## 2024-09-09 NOTE — Telephone Encounter (Signed)
 Left detailed message for DPR for patient to reach out to her PCP office as they should have a provider covering for hers. Instructed to reach back out to us  if needed.

## 2024-09-09 NOTE — Telephone Encounter (Signed)
 Patient returned call asking for a Z-pak please advise

## 2024-09-09 NOTE — Telephone Encounter (Signed)
 Left another detailed message per DPR advising pt to reach out covering for PCP or urgent care to get Zpack.

## 2024-09-10 NOTE — Telephone Encounter (Signed)
 Left message to call back if needing anything further

## 2024-09-10 NOTE — Telephone Encounter (Signed)
 Patient returned call asking for a Z-pak please advise
